# Patient Record
Sex: Female | Born: 1945
Health system: Southern US, Community
[De-identification: ages and names within clinical notes are randomized; demographics above are authoritative.]

## PROBLEM LIST (undated history)

## (undated) DIAGNOSIS — I341 Nonrheumatic mitral (valve) prolapse: Secondary | ICD-10-CM

## (undated) DIAGNOSIS — K529 Noninfective gastroenteritis and colitis, unspecified: Secondary | ICD-10-CM

## (undated) DIAGNOSIS — I8390 Asymptomatic varicose veins of unspecified lower extremity: Secondary | ICD-10-CM

## (undated) DIAGNOSIS — L900 Lichen sclerosus et atrophicus: Secondary | ICD-10-CM

## (undated) DIAGNOSIS — R29898 Other symptoms and signs involving the musculoskeletal system: Secondary | ICD-10-CM

## (undated) HISTORY — PX: TONSILLECTOMY: SUR1361

## (undated) HISTORY — DX: Noninfective gastroenteritis and colitis, unspecified: K52.9

## (undated) HISTORY — PX: TUBAL LIGATION: SHX77

## (undated) HISTORY — DX: Other symptoms and signs involving the musculoskeletal system: R29.898

## (undated) HISTORY — PX: RHINOPLASTY: SUR1284

## (undated) HISTORY — PX: OTHER SURGICAL HISTORY: SHX169

## (undated) HISTORY — DX: Asymptomatic varicose veins of unspecified lower extremity: I83.90

## (undated) HISTORY — DX: Lichen sclerosus et atrophicus: L90.0

## (undated) HISTORY — DX: Nonrheumatic mitral (valve) prolapse: I34.1

---

## 1996-06-11 ENCOUNTER — Encounter: Payer: Self-pay | Admitting: Gastroenterology

## 1996-06-25 ENCOUNTER — Encounter: Payer: Self-pay | Admitting: Gastroenterology

## 1999-10-21 ENCOUNTER — Other Ambulatory Visit: Admission: RE | Admit: 1999-10-21 | Discharge: 1999-10-21 | Payer: Self-pay | Admitting: Obstetrics and Gynecology

## 2000-11-17 ENCOUNTER — Other Ambulatory Visit: Admission: RE | Admit: 2000-11-17 | Discharge: 2000-11-17 | Payer: Self-pay | Admitting: Obstetrics and Gynecology

## 2001-06-28 ENCOUNTER — Encounter: Payer: Self-pay | Admitting: Otolaryngology

## 2001-06-28 ENCOUNTER — Encounter: Admission: RE | Admit: 2001-06-28 | Discharge: 2001-06-28 | Payer: Self-pay | Admitting: Otolaryngology

## 2001-11-27 ENCOUNTER — Other Ambulatory Visit: Admission: RE | Admit: 2001-11-27 | Discharge: 2001-11-27 | Payer: Self-pay | Admitting: Obstetrics and Gynecology

## 2002-05-02 ENCOUNTER — Other Ambulatory Visit: Admission: RE | Admit: 2002-05-02 | Discharge: 2002-05-02 | Payer: Self-pay | Admitting: Obstetrics and Gynecology

## 2002-12-19 ENCOUNTER — Other Ambulatory Visit: Admission: RE | Admit: 2002-12-19 | Discharge: 2002-12-19 | Payer: Self-pay | Admitting: Obstetrics and Gynecology

## 2003-12-23 ENCOUNTER — Other Ambulatory Visit: Admission: RE | Admit: 2003-12-23 | Discharge: 2003-12-23 | Payer: Self-pay | Admitting: Obstetrics and Gynecology

## 2004-12-24 ENCOUNTER — Ambulatory Visit: Payer: Self-pay | Admitting: Gastroenterology

## 2004-12-30 ENCOUNTER — Other Ambulatory Visit: Admission: RE | Admit: 2004-12-30 | Discharge: 2004-12-30 | Payer: Self-pay | Admitting: Obstetrics and Gynecology

## 2004-12-31 ENCOUNTER — Ambulatory Visit: Payer: Self-pay | Admitting: Gastroenterology

## 2005-05-23 ENCOUNTER — Ambulatory Visit: Payer: Self-pay | Admitting: Internal Medicine

## 2005-09-13 ENCOUNTER — Emergency Department (HOSPITAL_COMMUNITY): Admission: EM | Admit: 2005-09-13 | Discharge: 2005-09-13 | Payer: Self-pay | Admitting: Emergency Medicine

## 2005-09-13 IMAGING — CR DG CHEST 1V PORT
1 series · 1 of 1 positions shown · non-contrast
Comparison: No prior studies.

CLINICAL DATA: Mid chest pressure and pain.  Shortness of breath. 
PORTABLE CHEST ? 1 VIEW ? [DATE]:

[view not recorded]
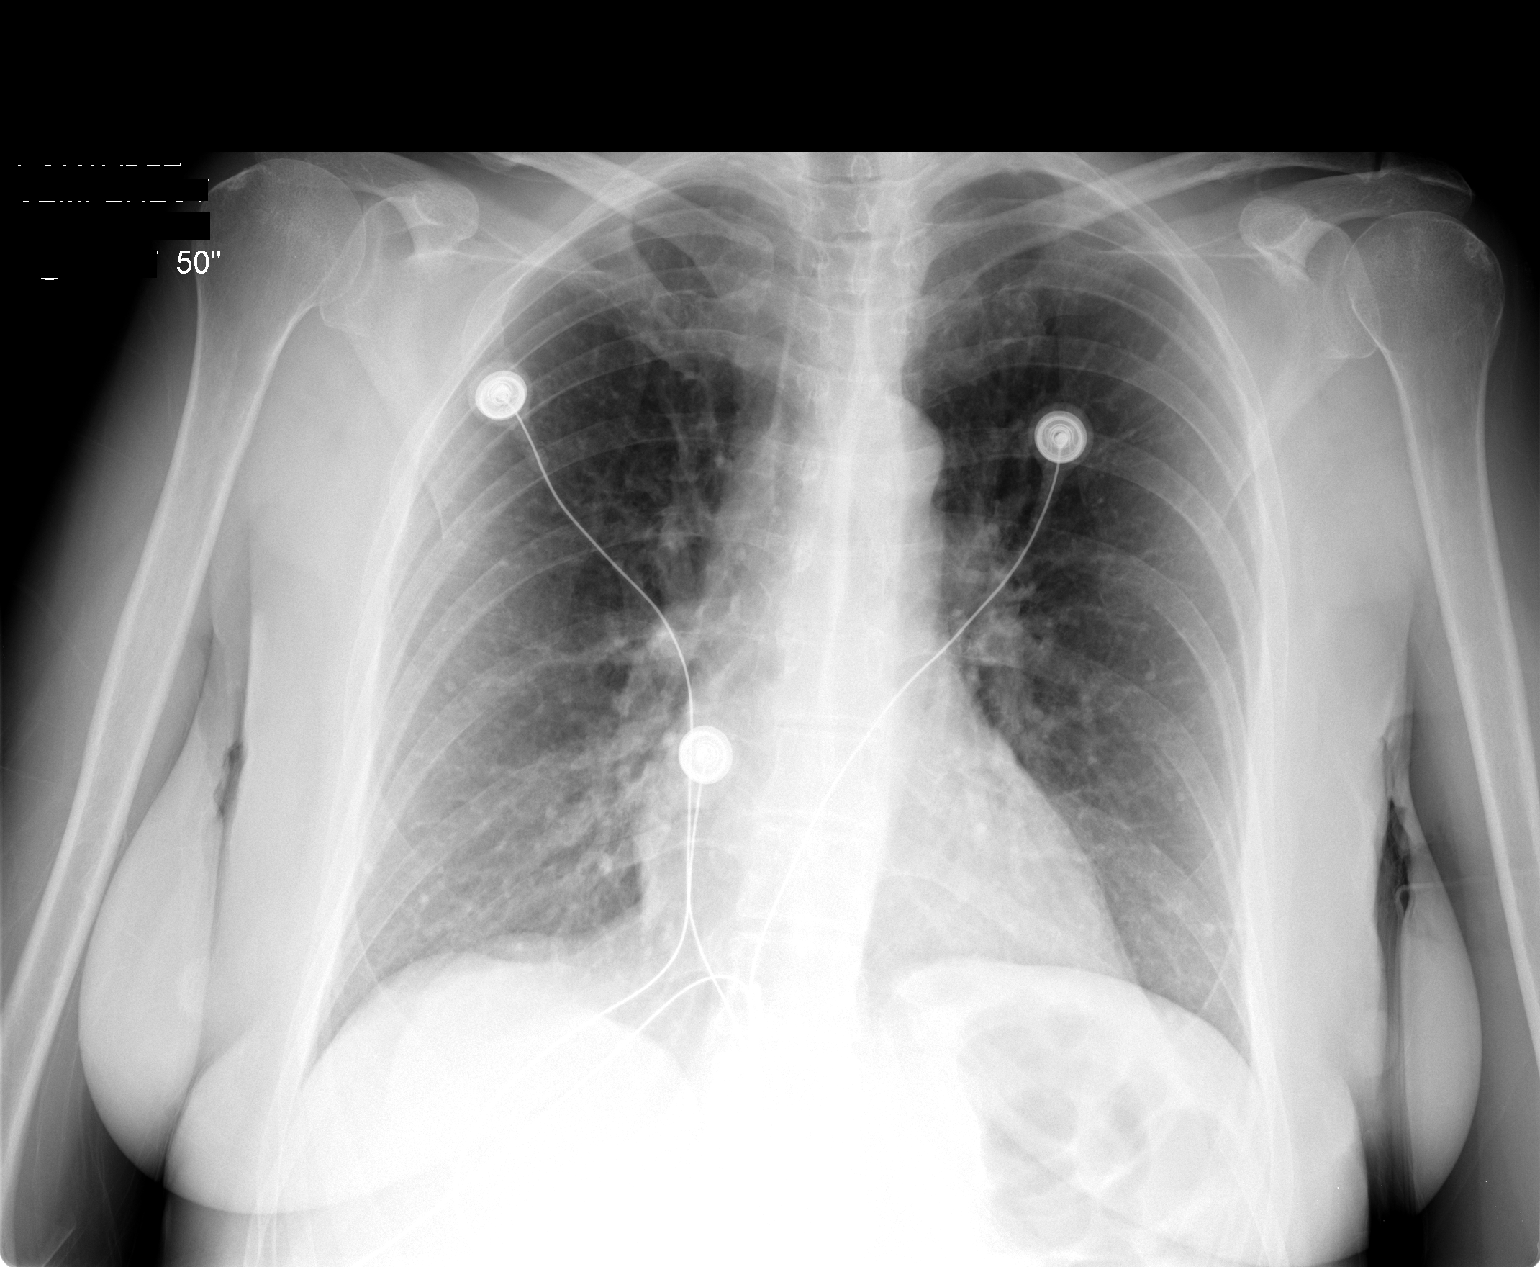

[1 of 1 positions shown; findings below may reference images not displayed]

FINDINGS: There are some scattered faint rounded micro-nodules in the lungs.  These are well-defined, and I suspect they could be calcified, as can be encountered in the setting of old granulomatous disease such as histoplasmosis, old varicella pneumonia, or alveolar microlithiasis.  Metastatic thyroid or osteosarcoma lesions are less likely cause of this type of appearance.  However, chest CT may be warranted for further characterization.  
The heart and mediastinum appear unremarkable.  No definite left heart enlargement to suggest mitral stenosis as a cause for potential pulmonary calcifications.
IMPRESSION: Scattered small micro-nodules.  These may well be calcified with differential diagnostic considerations as noted above.  nsider high resolution chest CT for further characterization.

## 2006-05-19 ENCOUNTER — Ambulatory Visit: Payer: Self-pay | Admitting: Internal Medicine

## 2006-05-29 ENCOUNTER — Ambulatory Visit: Payer: Self-pay | Admitting: Internal Medicine

## 2006-08-09 ENCOUNTER — Ambulatory Visit: Payer: Self-pay | Admitting: Internal Medicine

## 2006-08-09 DIAGNOSIS — Z9089 Acquired absence of other organs: Secondary | ICD-10-CM

## 2006-08-09 DIAGNOSIS — M810 Age-related osteoporosis without current pathological fracture: Secondary | ICD-10-CM | POA: Insufficient documentation

## 2007-02-05 ENCOUNTER — Telehealth (INDEPENDENT_AMBULATORY_CARE_PROVIDER_SITE_OTHER): Payer: Self-pay | Admitting: *Deleted

## 2007-04-03 ENCOUNTER — Ambulatory Visit: Payer: Self-pay | Admitting: Vascular Surgery

## 2007-04-03 ENCOUNTER — Emergency Department (HOSPITAL_COMMUNITY): Admission: EM | Admit: 2007-04-03 | Discharge: 2007-04-03 | Payer: Self-pay | Admitting: Emergency Medicine

## 2007-04-03 IMAGING — CR DG FOOT COMPLETE 3+V*R*
3 series · 3 of 3 positions shown · non-contrast
Comparison: none

CLINICAL DATA: 61-year-old female, right foot pain, swelling and redness.
 RIGHT FOOT ? 3 VIEW:

[t foot ap right]
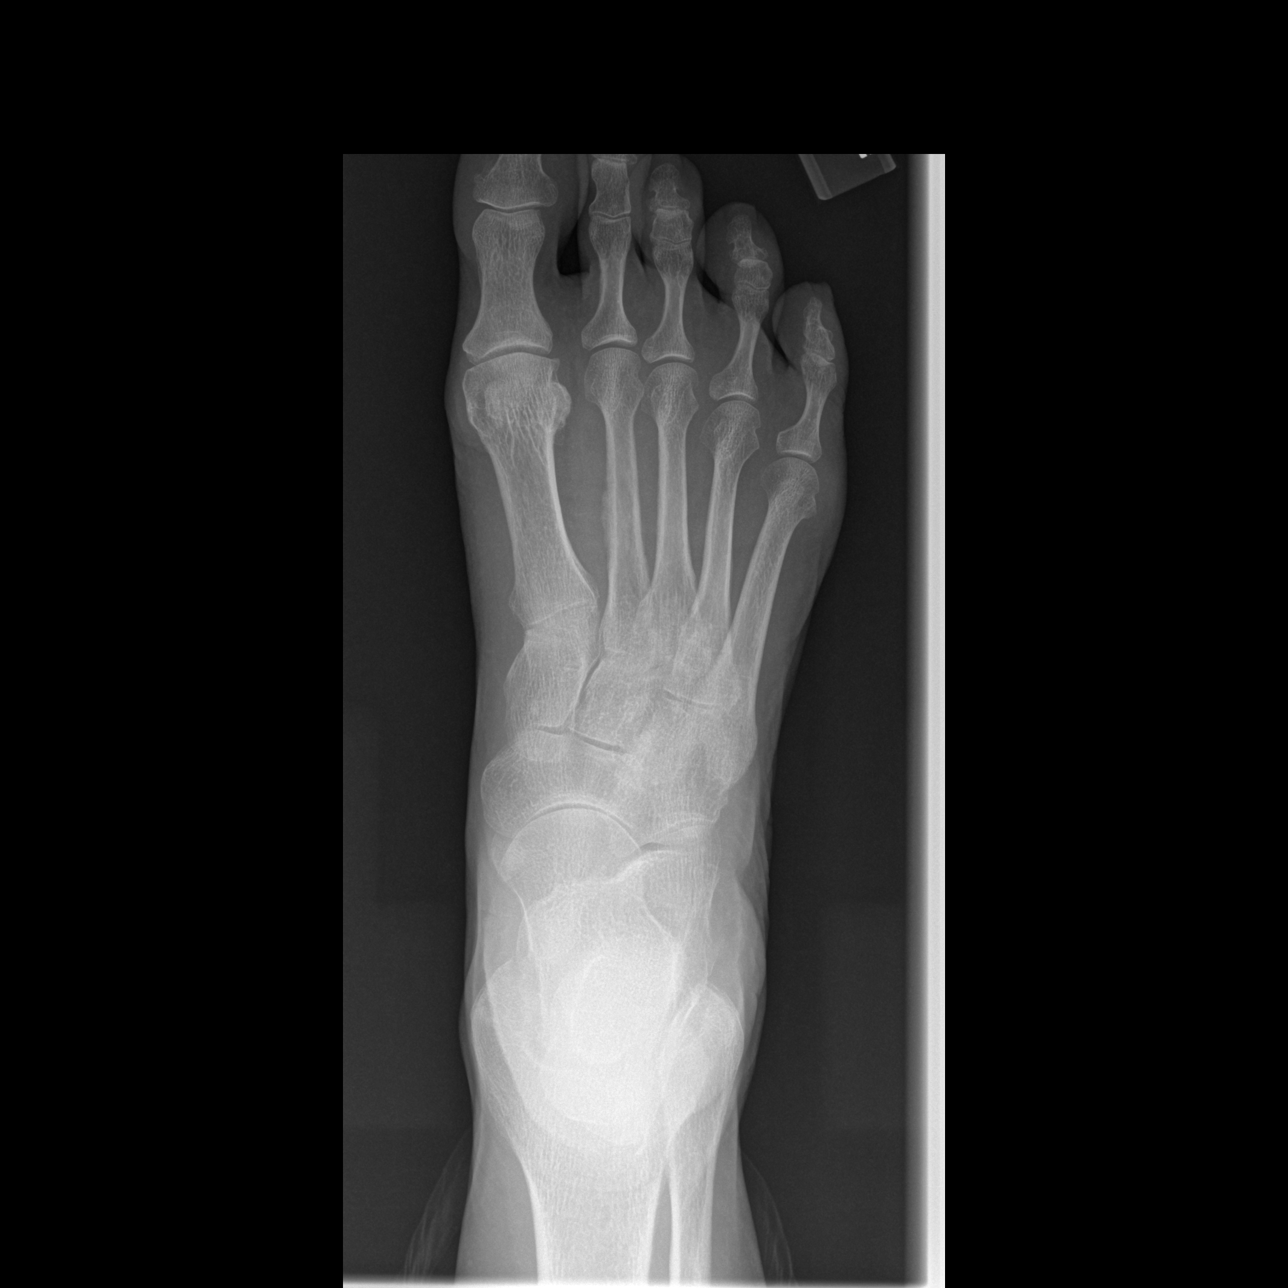

[t foot oblique right]
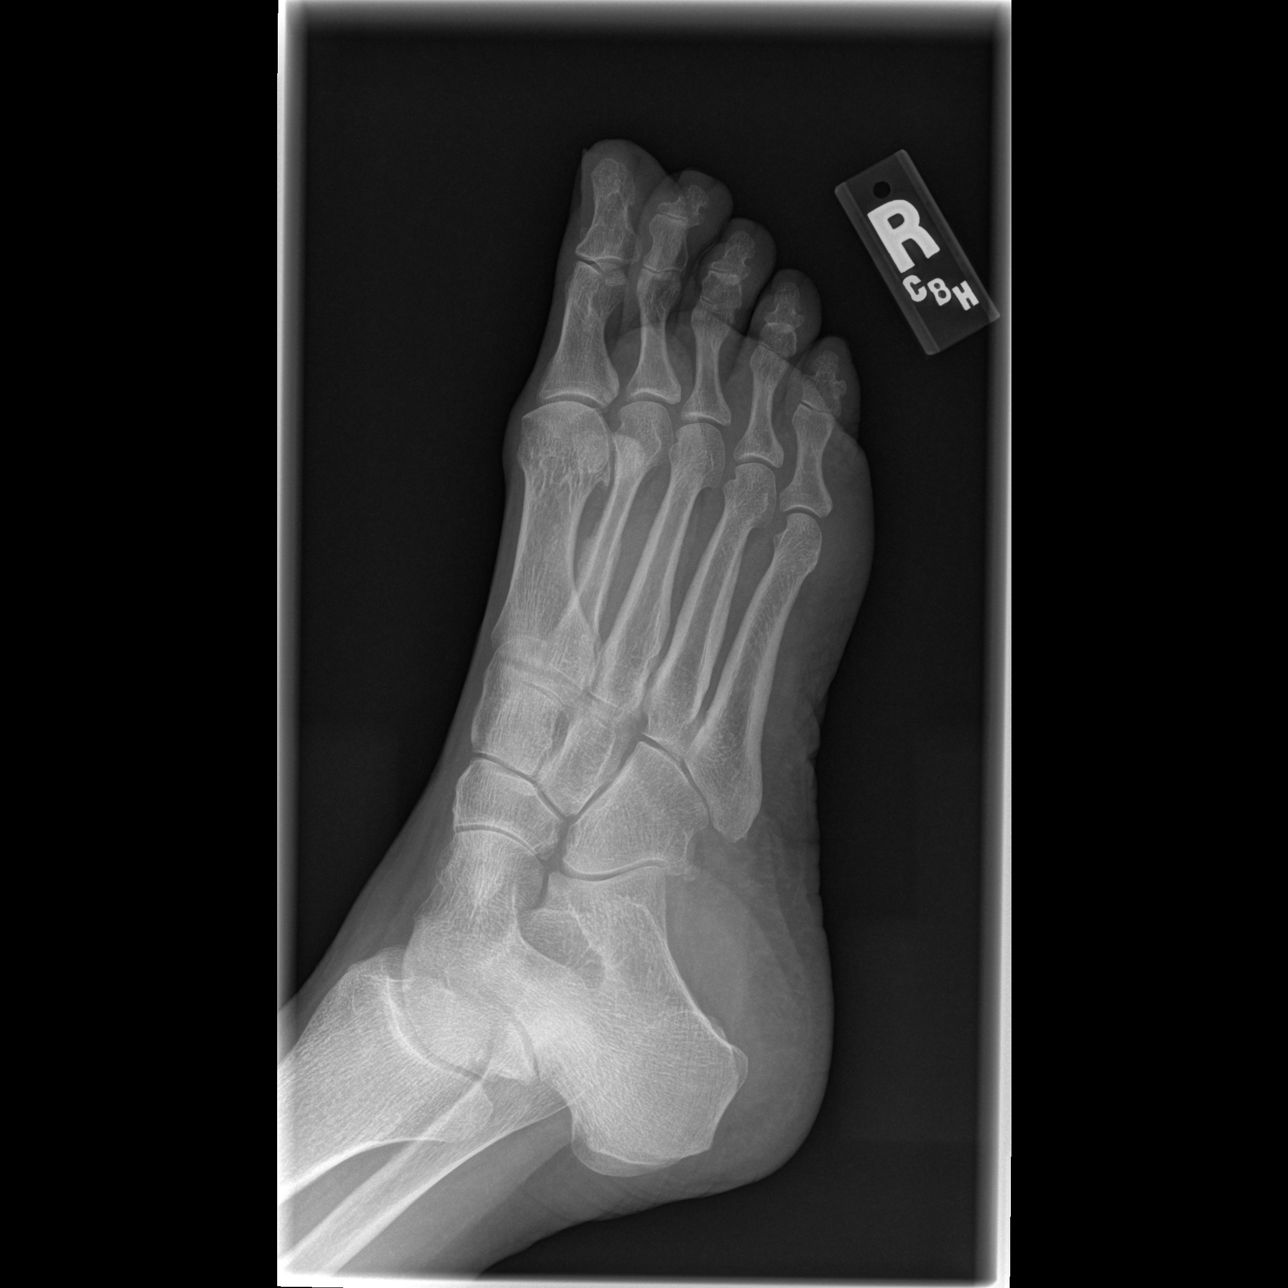

[t foot lat right]
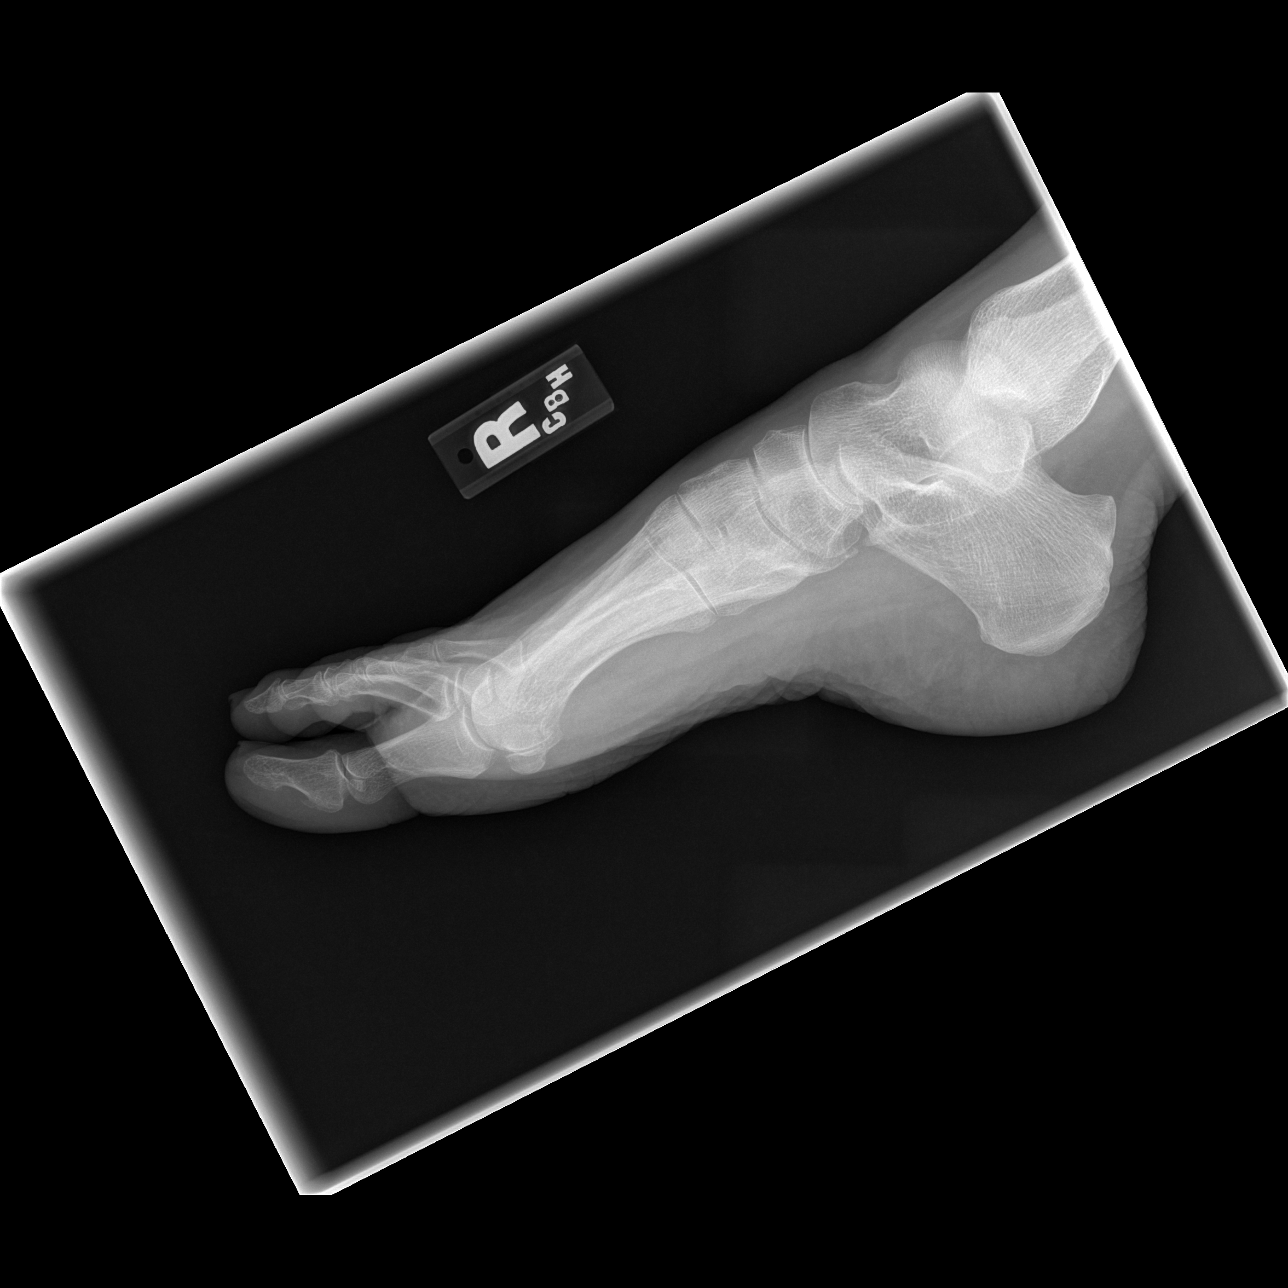

[3 of 3 positions shown; findings below may reference images not displayed]

FINDINGS: There is periostitis involving the midshaft of the second metatarsal bone.  No discrete fracture is seen.  This most likely represents a healing stress fracture.  Minimal soft tissue swelling is noted over the dorsum of the foot.
IMPRESSION: Periostitis of the mid shaft of the second metatarsal bone may represent a healing stress fracture.  Clinical correlation recommended.
 Minimal swelling over the dorsum of the foot.

## 2008-11-25 ENCOUNTER — Encounter: Admission: RE | Admit: 2008-11-25 | Discharge: 2008-11-25 | Payer: Self-pay | Admitting: Obstetrics and Gynecology

## 2008-11-25 IMAGING — MG MM DIAGNOSTIC BILATERAL
4 series · 4 of 4 positions shown · non-contrast
Comparison: [DATE]

[DATE] –DUPLICATE COPY for exam association in RIS. No change from original report.
CLINICAL DATA: Tenderness and thickening at the [DATE] position of
 the left breast

 DIGITAL DIAGNOSTIC BILATERAL MAMMOGRAM AND LEFT BREAST
 ULTRASOUND:

[R CC]
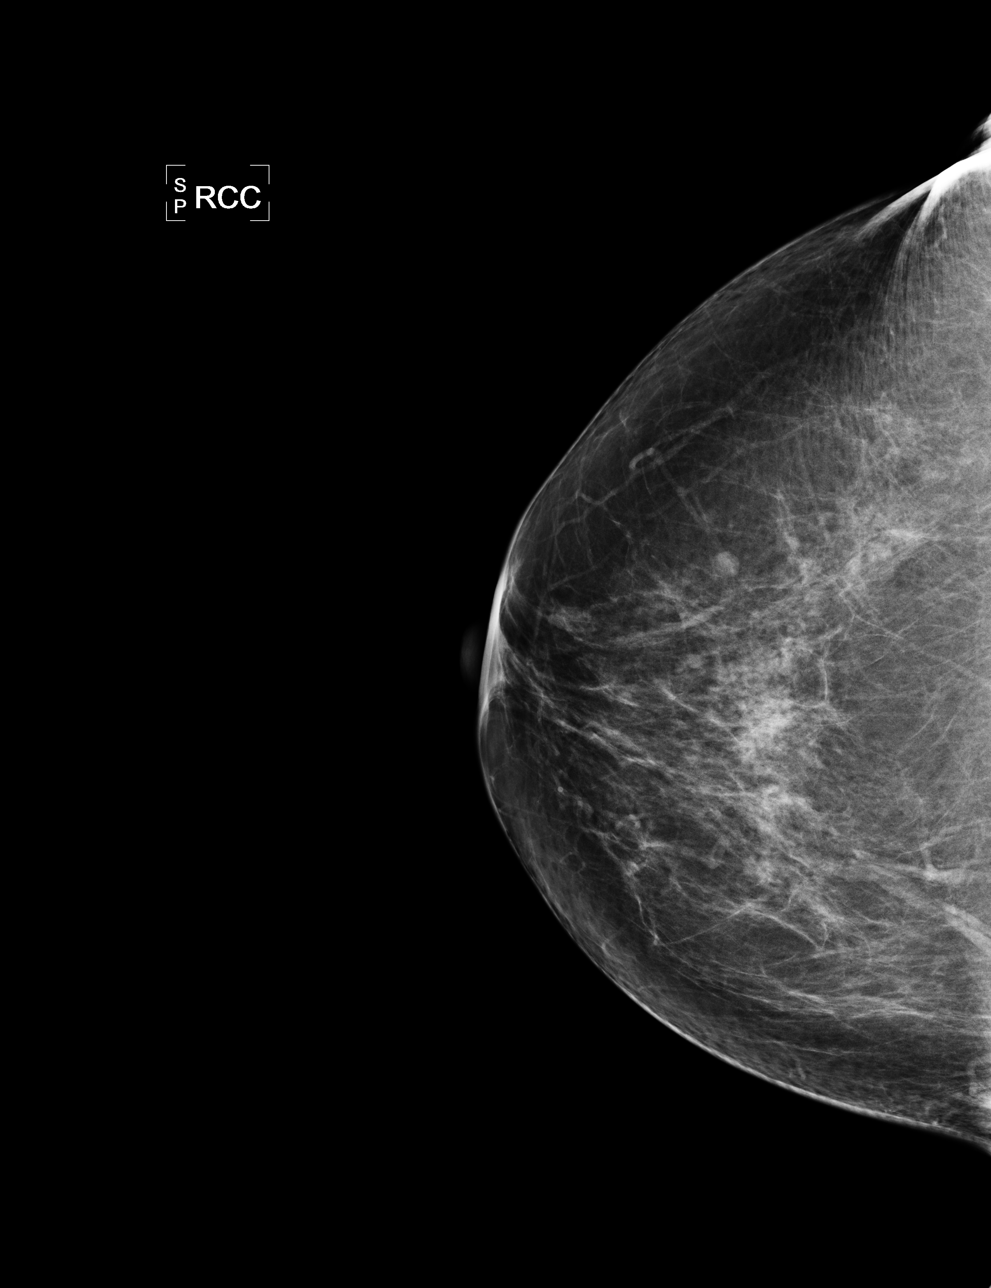

[L CC]
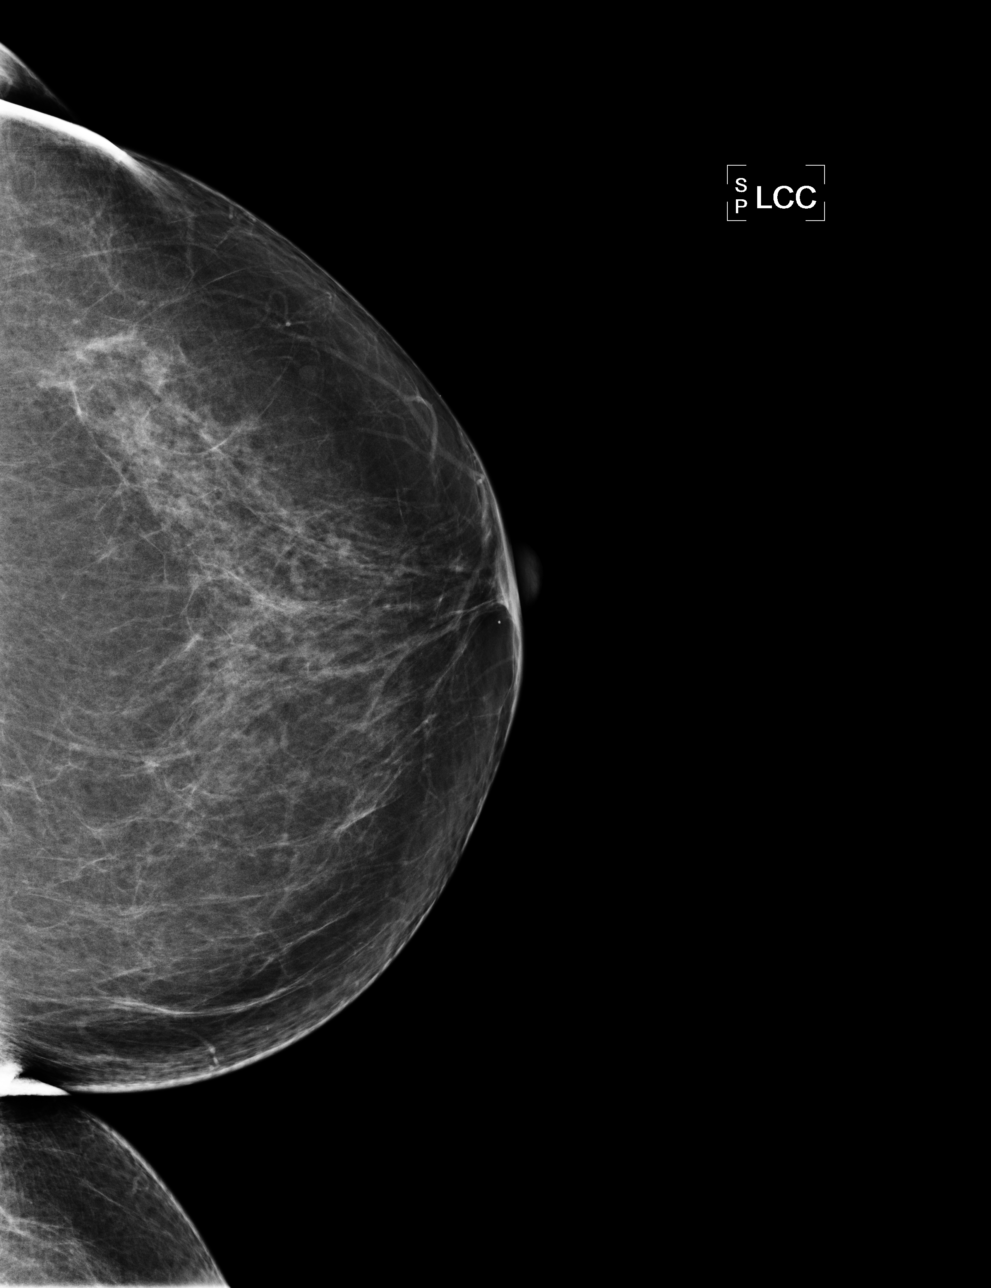

[L MLO]
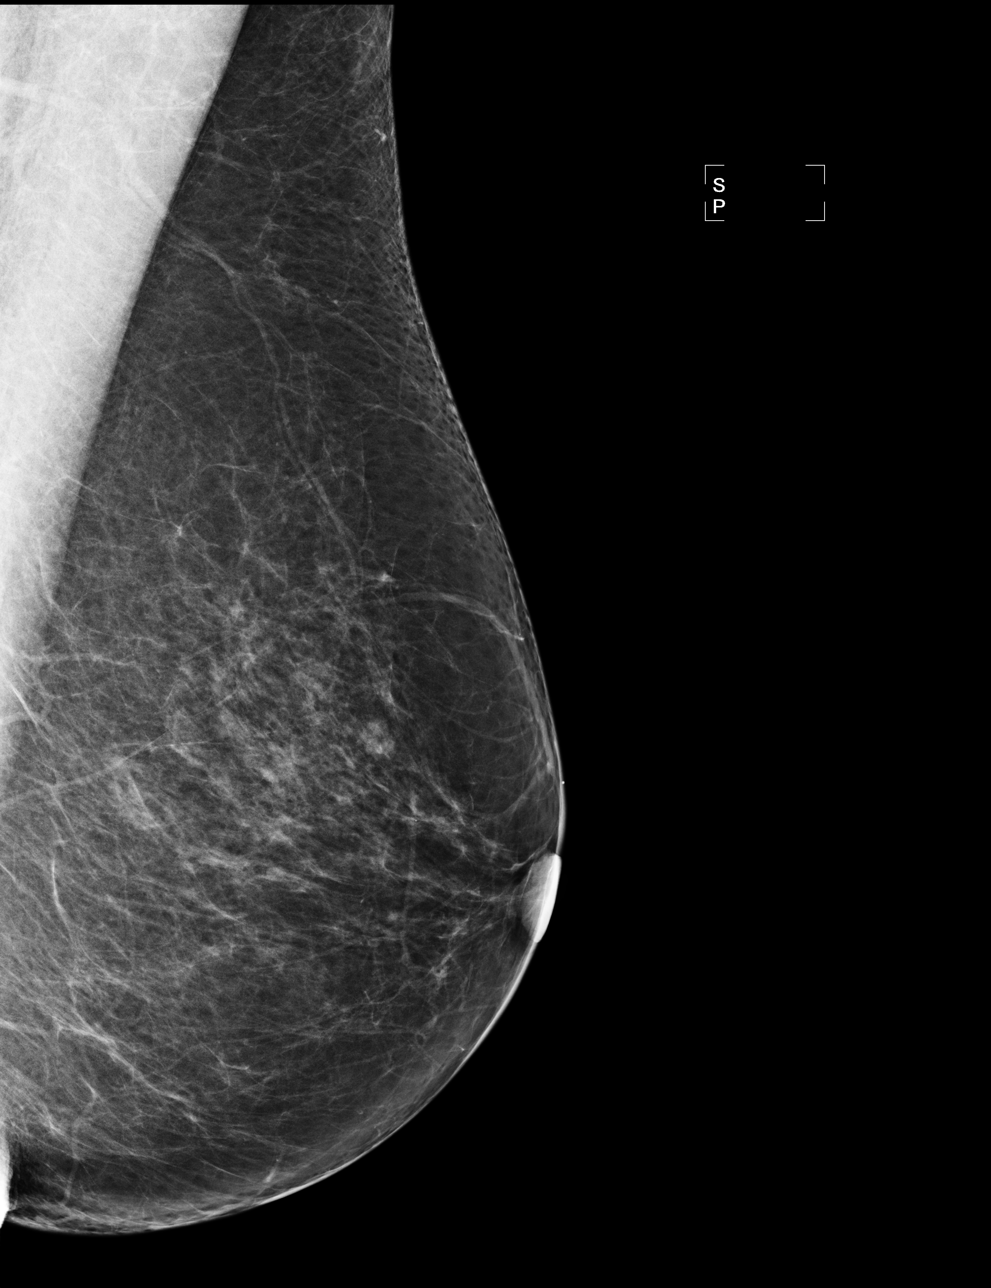

[R MLO]
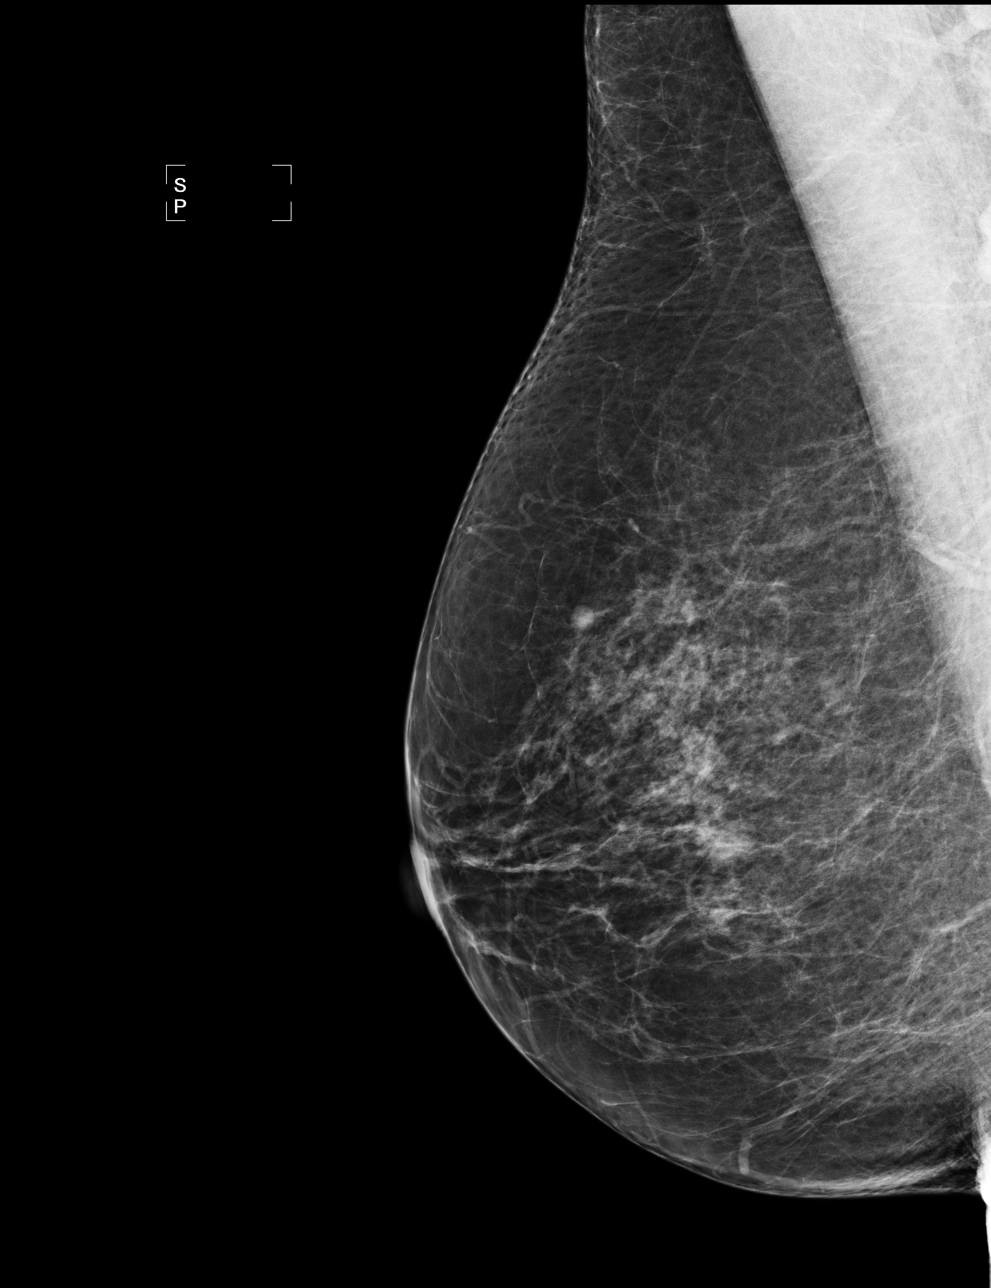

[4 of 4 positions shown; findings below may reference images not displayed]

FINDINGS: The scattered fibroglandular densities are symmetric and
 stable. No dominant masses, suspicious calcifications, or areas of
 architectural distortion are present bilaterally.

 On physical exam, I cannot palpate discrete lump within the left
 upper outer quadrant today.

 Ultrasound is performed, showing dense, normal-appearing
 fibroglandular tissue only in the left upper outer quadrant.
IMPRESSION: There is no radiographic evidence of malignancy bilaterally.
 Screening mammogram in 1 year recommended, unless otherwise
 clinically indicated.

 BI-RADS CATEGORY 2: Benign finding(s).

## 2008-11-25 IMAGING — US US BREAST L
1 series · 2 of 2 positions shown · non-contrast
Comparison: [DATE]

[DATE] –DUPLICATE COPY for exam association in RIS. No change from original report.
CLINICAL DATA: Tenderness and thickening at the [DATE] position of
 the left breast

 DIGITAL DIAGNOSTIC BILATERAL MAMMOGRAM AND LEFT BREAST
 ULTRASOUND:

[Series 1: us breast left · 2 of 2 slices shown]
[im 1/2]
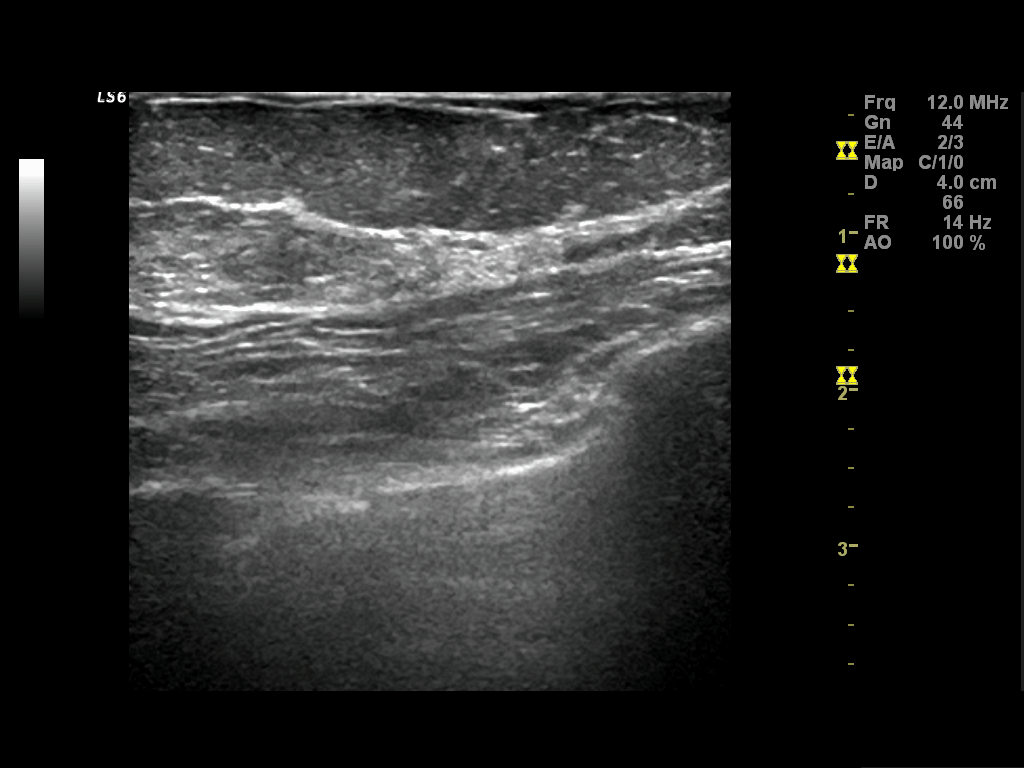
[im 2/2]
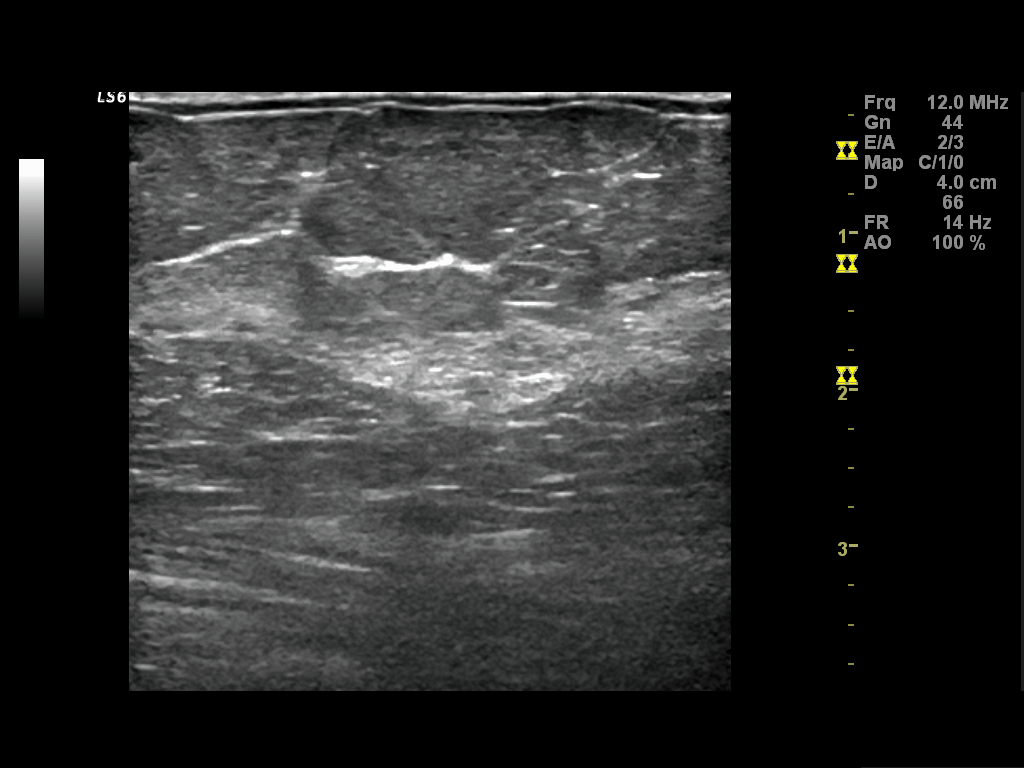

[2 of 2 positions shown; findings below may reference images not displayed]

FINDINGS: The scattered fibroglandular densities are symmetric and
 stable. No dominant masses, suspicious calcifications, or areas of
 architectural distortion are present bilaterally.

 On physical exam, I cannot palpate discrete lump within the left
 upper outer quadrant today.

 Ultrasound is performed, showing dense, normal-appearing
 fibroglandular tissue only in the left upper outer quadrant.
IMPRESSION: There is no radiographic evidence of malignancy bilaterally.
 Screening mammogram in 1 year recommended, unless otherwise
 clinically indicated.

 BI-RADS CATEGORY 2: Benign finding(s).

## 2010-04-20 ENCOUNTER — Inpatient Hospital Stay (HOSPITAL_COMMUNITY)
Admission: EM | Admit: 2010-04-20 | Discharge: 2010-04-22 | Payer: Self-pay | Source: Home / Self Care | Attending: Internal Medicine | Admitting: Internal Medicine

## 2010-04-21 ENCOUNTER — Encounter (INDEPENDENT_AMBULATORY_CARE_PROVIDER_SITE_OTHER): Payer: Self-pay | Admitting: *Deleted

## 2010-04-21 IMAGING — CT CT ABD-PELV W/O CM
2 of 4 series · 17 of 46 positions shown, 19 images · non-contrast
Comparison: None.

CLINICAL DATA: Low back and abdominal pain with nausea.  History of
tubal ligation.

CT ABDOMEN AND PELVIS WITHOUT CONTRAST
TECHNIQUE: Multidetector CT imaging of the abdomen and pelvis was
performed following the standard protocol without intravenous
contrast.

[Series 2: rtn ap without · axial · non-contrast · 0.57mm/px · z∈[-420,-64]mm · 14 of 79 slices shown, 16 images]
[im 4/79  soft-tissue]
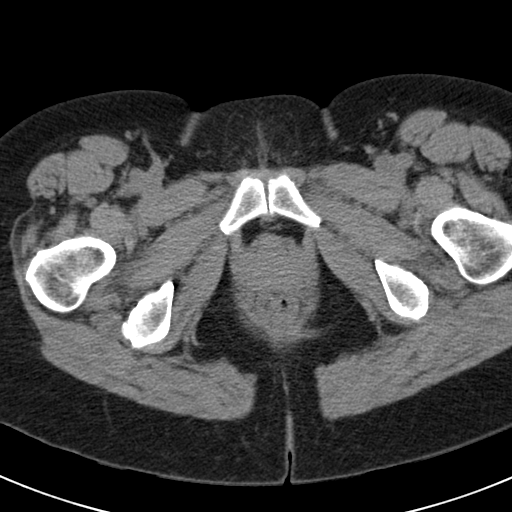
[im 4/79  bone]
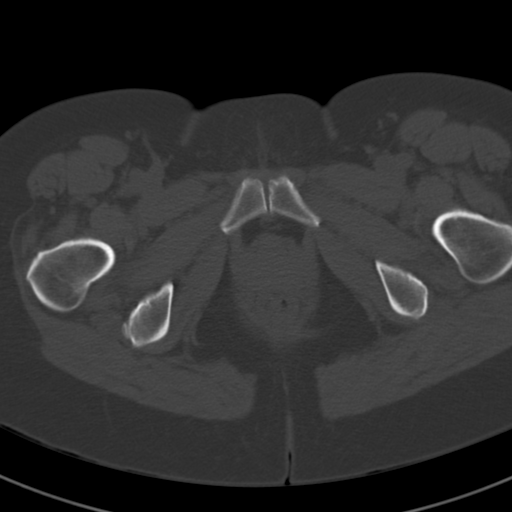
[im 10/79  soft-tissue]
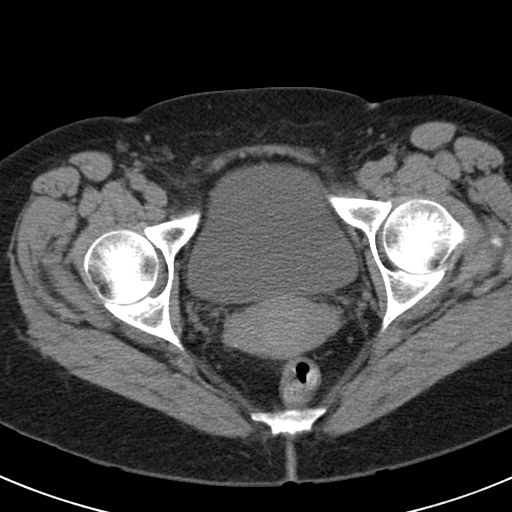
[im 16/79  soft-tissue]
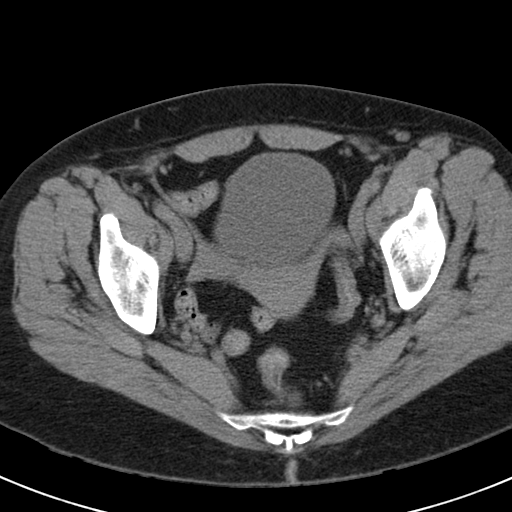
[im 22/79  soft-tissue]
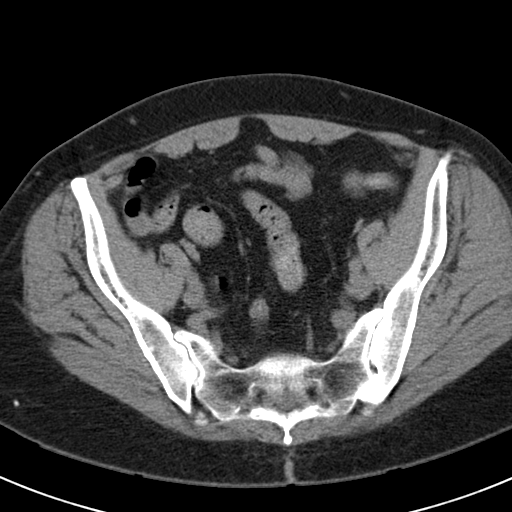
[im 25/79  soft-tissue]
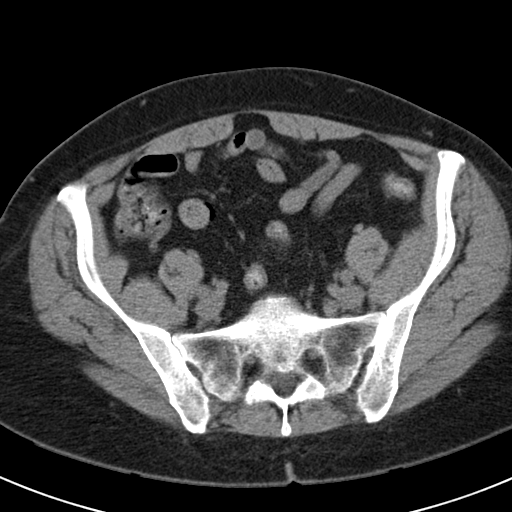
[im 32/79  soft-tissue]
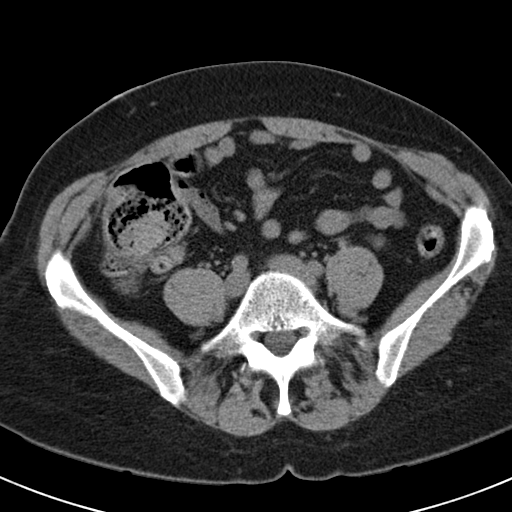
[im 38/79  soft-tissue]
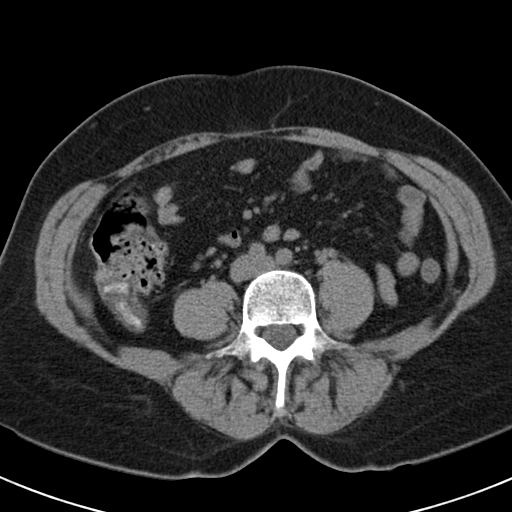
[im 41/79  soft-tissue]
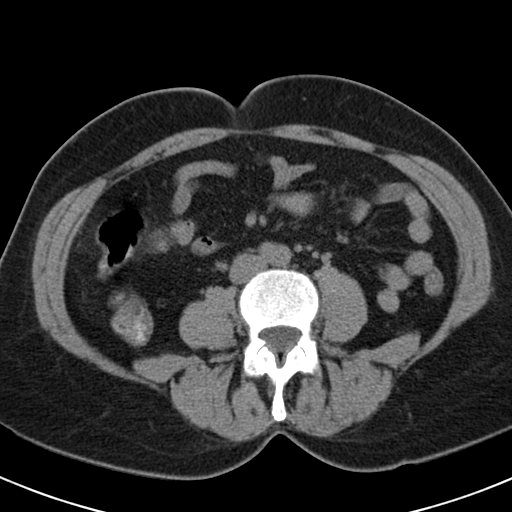
[im 47/79  soft-tissue]
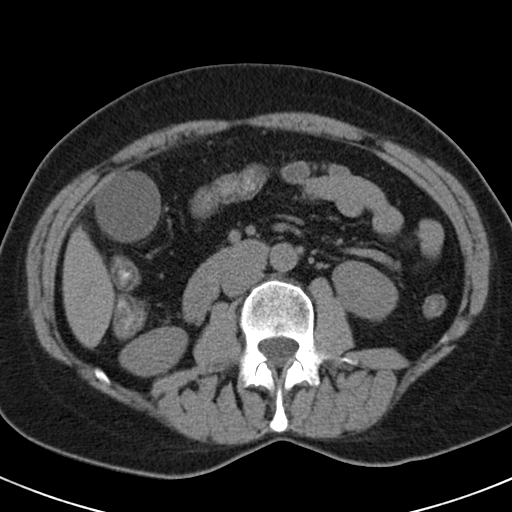
[im 47/79  bone]
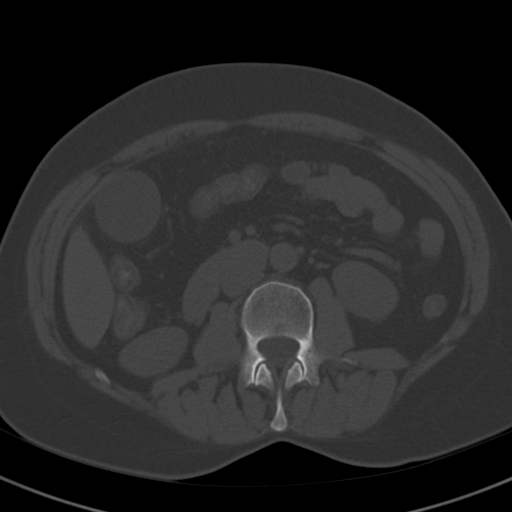
[im 54/79  soft-tissue]
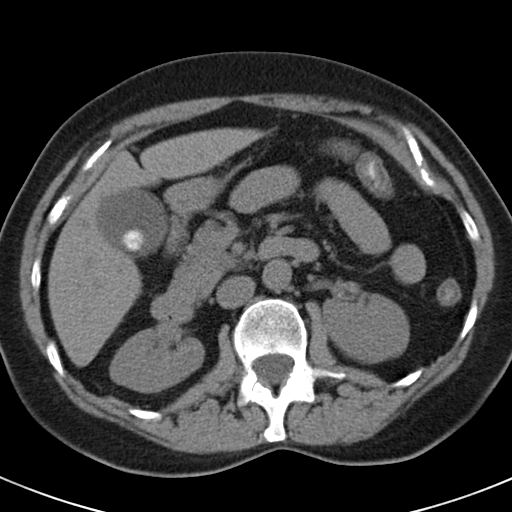
[im 60/79  soft-tissue]
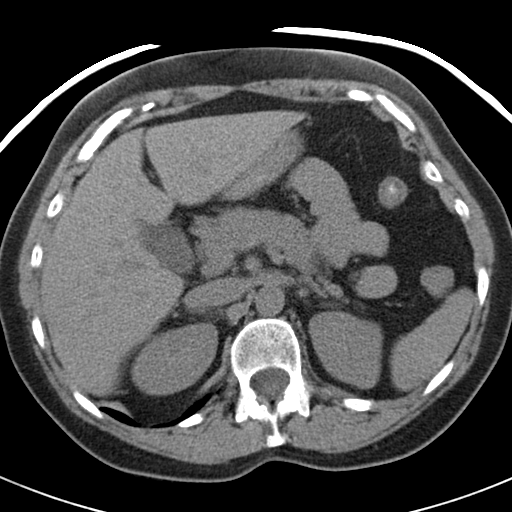
[im 63/79  soft-tissue]
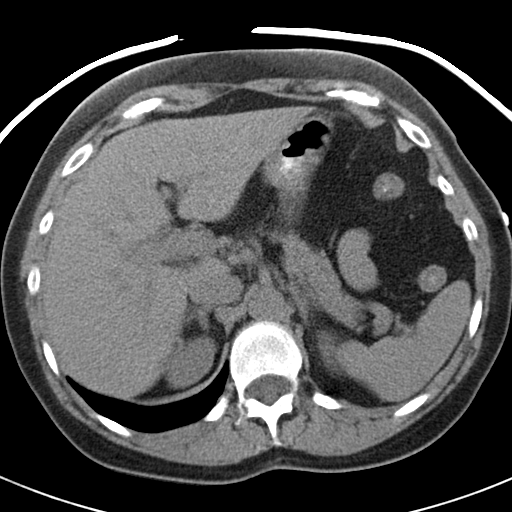
[im 69/79  soft-tissue]
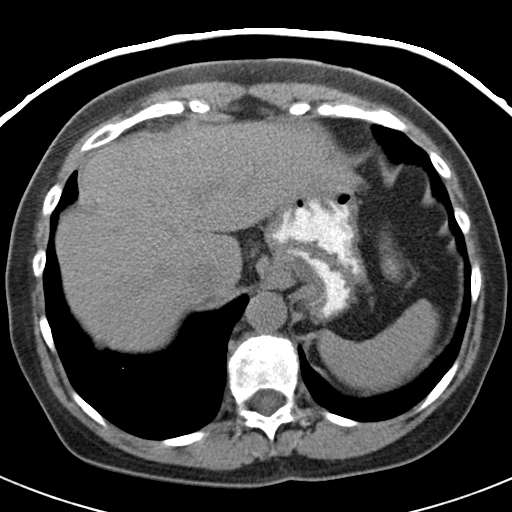
[im 75/79  soft-tissue]
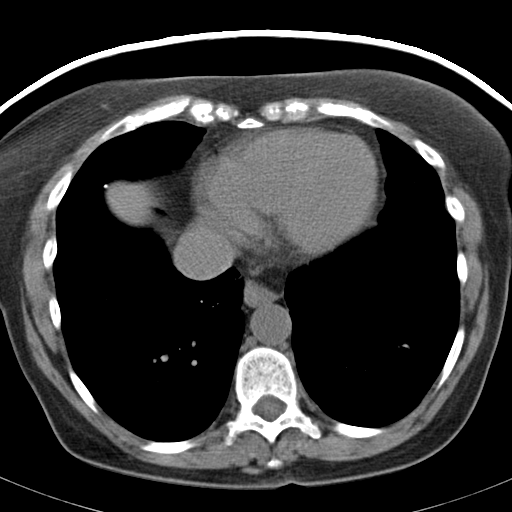

[Series 602: <mpr thick range> · coronal · 0.81mm/px · 3 of 71 slices shown]
[im 24/71  soft-tissue]
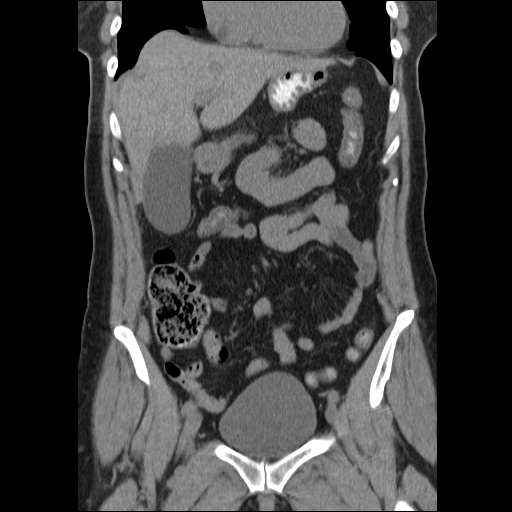
[im 32/71  soft-tissue]
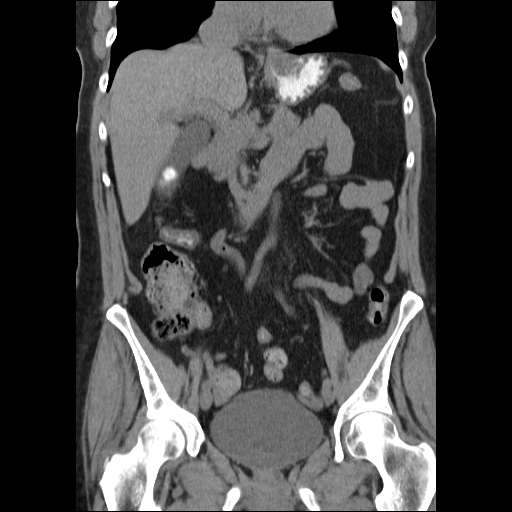
[im 39/71  soft-tissue]
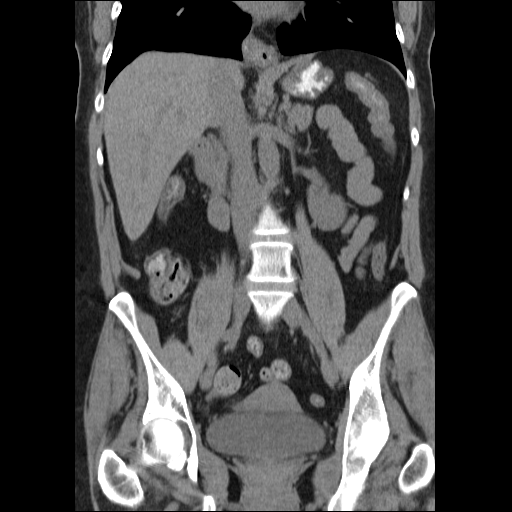

[17 of 46 positions shown; findings below may reference images not displayed]

FINDINGS: There are calcified granulomas in both lung bases.  Lung
bases are otherwise clear.  There is a small hiatal hernia.

1.4 cm calcified gallstone is noted.  There is no gallbladder wall
thickening or biliary dilatation.  The liver, spleen, pancreas,
adrenal glands and kidneys appear unremarkable.

The patient received limited enteric contrast.  The stomach is
decompressed.  No small bowel abnormalities are identified.  There
is probable diffuse colonic wall thickening extending from the
ascending colon into the descending colon.  No significant
surrounding inflammation is identified.  There is no significant
diverticular disease or evidence of extraluminal fluid collection.
There is no evidence of bowel obstruction or lymphadenopathy.

The osseous structures appear unremarkable for age.  The uterus,
ovaries and bladder appear unremarkable.
IMPRESSION: 1.  Diffuse colonic wall thickening suspicious for colitis,
suboptimally evaluated due to incomplete distension and lack of
intravenous contrast.  This distribution can be seen with
pseudomembranous colitis, other infections and Crohn's disease.
Distal sparing is atypical for ulcerative colitis.  Correlate
clinically.
2.  No evidence of perforation, abscess or obstruction.
3.  Cholelithiasis.
4.  No significant solid parenchymal organ findings.
5.  Calcified granulomas at both lung bases.

## 2010-04-22 ENCOUNTER — Encounter (INDEPENDENT_AMBULATORY_CARE_PROVIDER_SITE_OTHER): Payer: Self-pay | Admitting: *Deleted

## 2010-04-26 LAB — URINE MICROSCOPIC-ADD ON

## 2010-04-26 LAB — COMPREHENSIVE METABOLIC PANEL
ALT: 18 U/L (ref 0–35)
AST: 26 U/L (ref 0–37)
Albumin: 4.2 g/dL (ref 3.5–5.2)
CO2: 24 mEq/L (ref 19–32)
Calcium: 9.3 mg/dL (ref 8.4–10.5)
GFR calc Af Amer: >60 mL/min (ref >60)
Sodium: 138 mEq/L (ref 135–145)
Total Protein: 7.4 g/dL (ref 6.0–8.3)

## 2010-04-26 LAB — URINALYSIS, ROUTINE W REFLEX MICROSCOPIC
Bilirubin Urine: NEGATIVE
Hgb urine dipstick: NEGATIVE
Specific Gravity, Urine: 1.028 (ref 1.005–1.030)
Urine Glucose, Fasting: NEGATIVE mg/dL
pH: 6 (ref 5.0–8.0)

## 2010-04-26 LAB — BASIC METABOLIC PANEL
BUN: 7 mg/dL (ref 6–23)
Calcium: 8.2 mg/dL — ABNORMAL LOW (ref 8.4–10.5)
Creatinine, Ser: 0.82 mg/dL (ref 0.4–1.2)
GFR calc Af Amer: >60 mL/min (ref >60)

## 2010-04-26 LAB — CBC
Hemoglobin: 14.7 g/dL (ref 12.0–15.0)
MCH: 31 pg (ref 26.0–34.0)
MCHC: 33.6 g/dL (ref 30.0–36.0)
Platelets: 174 10*3/uL (ref 150–400)
RBC: 4.13 MIL/uL (ref 3.87–5.11)
RDW: 13 % (ref 11.5–15.5)
RDW: 13.5 % (ref 11.5–15.5)
WBC: 7.5 10*3/uL (ref 4.0–10.5)

## 2010-04-26 LAB — DIFFERENTIAL
Basophils Absolute: 0 10*3/uL (ref 0.0–0.1)
Basophils Relative: 0 % (ref 0–1)
Monocytes Absolute: 0.8 10*3/uL (ref 0.1–1.0)
Neutro Abs: 8.3 10*3/uL — ABNORMAL HIGH (ref 1.7–7.7)

## 2010-05-04 ENCOUNTER — Telehealth: Payer: Self-pay | Admitting: Gastroenterology

## 2010-05-04 ENCOUNTER — Ambulatory Visit
Admission: RE | Admit: 2010-05-04 | Discharge: 2010-05-04 | Payer: Self-pay | Source: Home / Self Care | Attending: Gastroenterology | Admitting: Gastroenterology

## 2010-05-04 ENCOUNTER — Encounter: Payer: Self-pay | Admitting: Gastroenterology

## 2010-05-04 DIAGNOSIS — R933 Abnormal findings on diagnostic imaging of other parts of digestive tract: Secondary | ICD-10-CM | POA: Insufficient documentation

## 2010-05-06 NOTE — Procedures (Signed)
Summary: Colonoscopy   Colonoscopy  Procedure date:  12/31/2004  Findings:      Results: Hemorrhoids.     Location:  [facility].    Procedures Next Due Date:    Colonoscopy: 01/2015  Colonoscopy  Procedure date:  12/31/2004  Findings:      Results: Hemorrhoids.     Location:  [facility].    Procedures Next Due Date:    Colonoscopy: 01/2015 Patient Name: Brandi, Villarreal MRN:  Procedure Procedures: Colonoscopy CPT: 62376.  Personnel: Endoscopist: Venita Lick. Russella Dar, MD, [facility] Graham.  Exam Location: Exam performed in Outpatient Clinic. Outpatient  Patient Consent: Procedure, Alternatives, Risks and Benefits discussed, consent obtained, from patient. Consent was obtained by the RN.  Indications  Average Risk Screening Routine.  History  Current Medications: Patient is not currently taking Coumadin.  Pre-Exam Physical: Performed Dec 31, 2004. Cardio-pulmonary exam, Rectal exam, HEENT exam , Abdominal exam, Mental status exam WNL.  Exam Exam: Extent of exam reached: Cecum, extent intended: Cecum.  The cecum was identified by appendiceal orifice and IC valve. Colon retroflexion performed. ASA Classification: II. Tolerance: excellent.  Monitoring: Pulse and BP monitoring, Oximetry used. Supplemental O2 given.  Colon Prep Used Glycolax for colon prep. Prep results: excellent.  Sedation Meds: Patient assessed and found to be appropriate for moderate (conscious) sedation. Fentanyl 125 mcg. given IV. Versed 12 given IV.  Findings NORMAL EXAM: Cecum to Sigmoid Colon.  HEMORRHOIDS: Internal. Size: Small. Not bleeding. Not thrombosed. ICD9: Hemorrhoids, Internal: 455.0.   Assessment  Diagnoses: 455.0: Hemorrhoids, Internal.   Events  Unplanned Interventions: No intervention was required.  Unplanned Events: There were no complications. Plans  Post Exam Instructions: Post sedation instructions given.  Medication Plan: PPI: QAM,    Patient Education: Patient given standard instructions for: Hemorrhoids. Reflux.  Disposition: After procedure patient sent to recovery. After recovery patient sent home.  Scheduling/Referral: Colonoscopy, to [facility] T. Russella Dar, MD, [facility], around Jan 01, 2015.  Home stool hemocults, to [facility] Dubin. Alwyn Ren, MD, resume annual testing, around Dec 31, 2009.    This report was created from the original endoscopy report, which was reviewed and signed by the above listed endoscopist.   cc: Titus Dubin. Alwyn Ren, MD

## 2010-05-07 ENCOUNTER — Other Ambulatory Visit (AMBULATORY_SURGERY_CENTER): Payer: BC Managed Care – PPO | Admitting: Gastroenterology

## 2010-05-07 ENCOUNTER — Encounter: Payer: Self-pay | Admitting: Gastroenterology

## 2010-05-07 DIAGNOSIS — Z1211 Encounter for screening for malignant neoplasm of colon: Secondary | ICD-10-CM

## 2010-05-07 DIAGNOSIS — K648 Other hemorrhoids: Secondary | ICD-10-CM

## 2010-05-07 DIAGNOSIS — Z800 Family history of malignant neoplasm of digestive organs: Secondary | ICD-10-CM

## 2010-05-07 LAB — HM COLONOSCOPY: HM Colonoscopy: NORMAL

## 2010-05-12 NOTE — Letter (Signed)
Summary: [facility] Instructions  [facility]  [address] [place], [place] [address]   Phone: [phone]  Fax: [phone]       Brandi Villarreal    10/07/1945    MRN: [id number]        Procedure Day /Date: Friday February 3rd, 2012     Arrival Time: 9:30am     Procedure Time: 10:30am     Location of Procedure:                    _ x_  [facility] (4th Floor)                        PREPARATION FOR COLONOSCOPY WITH MOVIPREP   Starting 5 days prior to your procedure today do not eat nuts, seeds, popcorn, corn, beans, peas,  salads, or any raw vegetables.  Do not take any fiber supplements (e.g. Metamucil, Citrucel, and Benefiber).  THE DAY BEFORE YOUR PROCEDURE         DATE: 05/06/10  DAY: Thursday  1.  Drink clear liquids the entire day-NO SOLID FOOD  2.  Do not drink anything colored red or purple.  Avoid juices with pulp.  No orange juice.  3.  Drink at least 64 oz. (8 glasses) of fluid/clear liquids during the day to prevent dehydration and help the prep work efficiently.  CLEAR LIQUIDS INCLUDE: Water Jello Ice Popsicles Tea (sugar ok, no milk/cream) Powdered fruit flavored drinks Coffee (sugar ok, no milk/cream) Gatorade Juice: apple, white grape, white cranberry  Lemonade Clear bullion, consomm, broth Carbonated beverages (any kind) Strained chicken noodle soup Hard Candy                             4.  In the morning, mix first dose of MoviPrep solution:    Empty 1 Pouch A and 1 Pouch B into the disposable container    Add lukewarm drinking water to the top line of the container. Mix to dissolve    Refrigerate (mixed solution should be used within 24 hrs)  5.  Begin drinking the prep at 5:00 p.m. The MoviPrep container is divided by 4 marks.   Every 15 minutes drink the solution down to the next mark (approximately 8 oz) until the full liter is complete.   6.  Follow completed prep with 16 oz of clear liquid of your choice  (Nothing red or purple).  Continue to drink clear liquids until bedtime.  7.  Before going to bed, mix second dose of MoviPrep solution:    Empty 1 Pouch A and 1 Pouch B into the disposable container    Add lukewarm drinking water to the top line of the container. Mix to dissolve    Refrigerate  THE DAY OF YOUR PROCEDURE      DATE: 05/07/10 DAY: Friday  Beginning at 5:30 a.m. (5 hours before procedure):         1. Every 15 minutes, drink the solution down to the next mark (approx 8 oz) until the full liter is complete.  2. Follow completed prep with 16 oz. of clear liquid of your choice.    3. You may drink clear liquids until 8:30am (2 HOURS BEFORE PROCEDURE).   MEDICATION INSTRUCTIONS  Unless otherwise instructed, you should take regular prescription medications with a small sip of water   as early as possible the morning of your  procedure.       OTHER INSTRUCTIONS  You will need a responsible adult at least 65 years of age to accompany you and drive you home.   This person must remain in the waiting room during your procedure.  Wear loose fitting clothing that is easily removed.  Leave jewelry and other valuables at home.  However, you may wish to bring a book to read or  an iPod/MP3 player to listen to music as you wait for your procedure to start.  Remove all body piercing jewelry and leave at home.  Total time from sign-in until discharge is approximately 2-3 hours.  You should go home directly after your procedure and rest.  You can resume normal activities the  day after your procedure.  The day of your procedure you should not:   Drive   Make legal decisions   Operate machinery   Drink alcohol   Return to work  You will receive specific instructions about eating, activities and medications before you leave.    The above instructions have been reviewed and explained to me by   Marchelle Folks.    I fully understand and can verbalize these instructions  _____________________________ Date _________

## 2010-05-12 NOTE — Procedures (Signed)
Summary: EGD/[facility]  EGD/[facility]   Imported By: Sherian Rein 05/04/2010 13:50:30  _____________________________________________________________________  External Attachment:    Type:   Image     Comment:   External Document

## 2010-05-12 NOTE — Assessment & Plan Note (Signed)
Summary: post hosp, Gastroenteritis, family hx of Ca, will need colon/all   History of Present Illness Visit Type: Initial Visit Primary GI MD: Elie Goody MD [facility] Primary Provider: Marga Melnick,. MD Requesting Provider: n/a Chief Complaint: Patient here for f/u after hospitalization for gastroenteritis. Patient states that she feels better and denies any current GI symptoms. History of Present Illness:   Mrs Brandi Villarreal returns following hospitalization for a self-limited gastroenteritis approximately 2 weeks ago. Her symptoms resolved within 24 hours and she was discharged. She has no gastrointestinal complaints today. I reviewed the discharge summary, blood work and CT scan. CT showed a diffusely thickened colon, felt most likely due to under distention of the bowel, and cholelithiasis. Her brother developed colon cancer in his early 55s. Her last colonoscopy was in September 2006.   GI Review of Systems      Denies abdominal pain, acid reflux, belching, bloating, chest pain, dysphagia with liquids, dysphagia with solids, heartburn, loss of appetite, nausea, vomiting, vomiting blood, weight loss, and  weight gain.      Reports hemorrhoids.     Denies anal fissure, black tarry stools, change in bowel habit, constipation, diarrhea, diverticulosis, fecal incontinence, heme positive stool, irritable bowel syndrome, jaundice, light color stool, liver problems, rectal bleeding, and  rectal pain. Preventive Screening-Counseling & Management  Caffeine-Diet-Exercise     Does Patient Exercise: yes   Current Medications (verified): 1)  Aleve 220 Mg Tabs (Naproxen Sodium) .... One Tablet By Mouth Every 8 Hours As Needed For Pain 2)  Calcium-Vitamin D 600-200 Mg-Unit Tabs (Calcium-Vitamin D) .... One Tablet By Mouth Once Daily 3)  Multivitamins   Tabs (Multiple Vitamin) .... One Tablet By Mouth Once Daily 4)  Spirlina Herb (Unknown Dosage) .... Take 1 Tablet By Mouth Once A Day 5)  Alfamin   Tabs (Alfalfa) .... Take 2 Tablets By Mouth Once A Day 6)  Non Flushable Niacin  Herb (Unknown Dosage) .... Take 1 Tablet By Mouth Once A Day  Allergies: 1)  ! Pcn 2)  ! Sulfa 3)  ! Asa 4)  ! * Seafood 5)  ! Iodine 6)  ! * Beconase 7)  ! * Flonase 8)  ! * Azmacort 9)  ! Morphine 10)  ! * Mepragan Fortus  Past History:  Past Medical History: Osteoporosis Hemorrhoids Mitral Valve Prolapse Colitis ?1975 Cholelithiasis  Past Surgical History: Rhinoplasty Jaw resection Bilteral Tubal Ligation Tonsillectomy  Family History: Family History of Colon Cancer: Brother, 11s  Social History: Married Librarian, academic Patient has never smoked.  Alcohol Use - yes wine-rare Illicit Drug Use - no Daily Caffeine Use-1 cup daily Patient gets regular exercise. Does Patient Exercise:  yes  Review of Systems       The patient complains of urine leakage.         The pertinent positives and negatives are noted as above and in the HPI. All other ROS were reviewed and were negative.   Vital Signs:  Patient profile:   65 year old female Height:      62.5 inches Weight:      143 pounds BMI:     25.83 BSA:     1.67 Pulse rate:   76 / minute Pulse rhythm:   regular BP sitting:   102 / 70  (left arm)  Vitals Entered By: Lamona Curl CMA (AAMA) (May 04, 2010 10:10 AM)  Physical Exam  General:  Well developed, well nourished, no acute distress. Head:  Normocephalic and atraumatic. Eyes:  PERRLA, no icterus. Ears:  Normal auditory acuity. Mouth:  No deformity or lesions, dentition normal. Neck:  Supple; no masses or thyromegaly. Lungs:  Clear throughout to auscultation. Heart:  Regular rate and rhythm; no murmurs, rubs,  or bruits. Abdomen:  Soft, nontender and nondistended. No masses, hepatosplenomegaly or hernias noted. Normal bowel sounds. Rectal:  deferred until time of colonoscopy.   Msk:  Symmetrical with no gross deformities. Normal posture. Pulses:  Normal  pulses noted. Extremities:  No clubbing, cyanosis, edema or deformities noted. Neurologic:  Alert and  oriented x4;  grossly normal neurologically. Cervical Nodes:  No significant cervical adenopathy. Inguinal Nodes:  No significant inguinal adenopathy. Psych:  Alert and cooperative. Normal mood and affect.  Impression & Recommendations:  Problem # 1:  ABNORMAL FINDINGS GI TRACT (ICD-793.4) Abnormal diffusely thickened colon wall on CT scan, likely due to underdistention of the bowel. Rule out colitis, and other disorders. This may have been a self-limited change related to her recent gastroenteritis. The risks, benefits and alternatives to colonoscopy with possible biopsy and possible polypectomy were discussed with the patient and they consent to proceed. The procedure will be scheduled electively. Orders: Colonoscopy (Colon)  Problem # 2:  FAMILY HX COLON CANCER (ICD-V16.0) Brother with colon cancer in his 83s. She is due for colonoscopy as above. Orders: Colonoscopy (Colon)  Problem # 3:  CHOLELITHIASIS (ICD-574.2) Asymptomatic cholelithiasis. Expectant management.  Patient Instructions: 1)  Pick up prep from your pharmacy.  2)  Colonoscopy brochure given.  3)  Copy sent to : Marga Melnick, MD 4)  The medication list was reviewed and reconciled.  All changed / newly prescribed medications were explained.  A complete medication list was provided to the patient / caregiver.  Prescriptions: MOVIPREP 100 GM  SOLR (PEG-KCL-NACL-NASULF-NA ASC-C) As per prep instructions.  #1 x 0   Entered by:   Christie Nottingham CMA (AAMA)   Authorized by:   Meryl Dare MD [facility]   Signed by:   Christie Nottingham CMA (AAMA) on 05/04/2010   Method used:   Electronically to        [facility]  [phone]* (retail)       [address] [place], [place]  [address]       Ph: [phone] or [phone]       Fax: [phone]   RxID:   [id number]

## 2010-05-12 NOTE — Progress Notes (Signed)
Summary: Medication ?s  Phone Note Call from Patient Call back at Home Phone [phone] Call back at [phone]   Caller: Patient Call For: Dr. Russella Dar Reason for Call: Talk to Nurse Summary of Call: Pt has questions about her vitamins and if she can continue to take them before procedure Initial call taken by: [place] Johnson,  May 04, 2010 4:15 PM  Follow-up for Phone Call        all questions answered.  She will keep her colon appointment  for Friday.  She will call back for any further questions Follow-up by: Darcey Nora RN, CGRN,  May 04, 2010 4:47 PM

## 2010-05-12 NOTE — Letter (Signed)
Summary: [facility]   Imported By: Sherian Rein 05/04/2010 13:51:51  _____________________________________________________________________  External Attachment:    Type:   Image     Comment:   External Document

## 2010-05-20 NOTE — Procedures (Signed)
Summary: Colonoscopy   Colonoscopy  Procedure date:  05/07/2010  Findings:      Location:  [facility].    Procedures Next Due Date:    Colonoscopy: 05/2015 COLONOSCOPY PROCEDURE REPORT PATIENT:  Brandi Villarreal, Brandi Villarreal  MR#:  [id number] BIRTHDATE:   1945/04/07, 64 yrs. old   GENDER:   female ENDOSCOPIST:   Judie Petit T. Russella Dar, MD, [facility]   PROCEDURE DATE:  05/07/2010 PROCEDURE:  Colonoscopy 40981 ASA CLASS:   Class II INDICATIONS: 1) Elevated Risk Screening  2) family history of colon cancer: brother in his early 70s. MEDICATIONS:    Fentanyl 75 mcg IV, Versed 9 mg IV DESCRIPTION OF PROCEDURE:   After the risks benefits and alternatives of the procedure were thoroughly explained, informed consent was obtained.  Digital rectal exam was performed and revealed no abnormalities.   The LB [id number] endoscope was introduced through the anus and advanced to the cecum, which was identified by both the appendix and ileocecal valve, without limitations.  The quality of the prep was excellent, using MoviPrep.  The instrument was then slowly withdrawn as the colon was fully examined. <<PROCEDUREIMAGES>>          <<OLD IMAGES>> FINDINGS:  A normal appearing cecum, ileocecal valve, and appendiceal orifice were identified. The ascending, hepatic flexure, transverse, splenic flexure, descending, sigmoid colon, and rectum appeared unremarkable. Retroflexed views in the rectum revealed internal hemorrhoids, small. The time to cecum =  5.33  minutes. The scope was then withdrawn (time =  8.5  min) from the patient and the procedure completed.  COMPLICATIONS:   None  ENDOSCOPIC IMPRESSION:  1) Normal colon  2) Internal hemorrhoids  RECOMMENDATIONS:  1) Given your family history of colon cancer, you should have a repeat colonoscopy in 5 years    Myers Tutterow T. Russella Dar, MD, [facility]

## 2010-06-04 ENCOUNTER — Encounter: Payer: Self-pay | Admitting: Internal Medicine

## 2010-08-20 NOTE — Assessment & Plan Note (Signed)
[facility]                        [place] OFFICE NOTE   Brandi Villarreal                       MRN:          [id number]  DATE:08/09/2006                            DOB:          Oct 23, 1945    Brandi Villarreal was seen Aug 09, 2006 for persistent cough that has actually  been present since February when she was seen for bronchitis.  She was  originally seen February 15 and placed on Zithromax and Advair and  Tussionex.  She was also seen on February 25th with wheezing and  evidence of reactive airways disease, although there was no past history  of asthma.  She was placed on Combivent every 4 hours as needed which  she has used.   At this time, she denies fevers, chills, sweats, fatigue.  She had an  isolated headache in the right frontal area on May 5th.  She has had  nasal congestion and facial pain, but no obstruction, nasal purulence,  or anosmia.  She describes halitosis as chronic phenomena.  She has had  some dental pain, but she relates this to a prosthesis she wears.  She  has had some postnasal drainage; she denies earache.  Cough has been  productive, but only of plugs.   She had used [place], but questioned whether it might be causing eye  pain.  She  read the package insert and was concerned about glaucoma or  cataract formation.   She has a history of taking allergy shots, but  as dosage was  increasing, symptoms were not improving.  She had been told that she had  remote histo based on prior x-rays.   She denies any heartburn or reflux since she stopped the  bisphosphonates.   FAMILY HISTORY:  Reveals reactive airways disease and some chronic  obstructive lung disease in her daughter.   Other past medical history reveals a negative endoscopy.  Colonoscopy  has only revealed internal hemorrhoids. She has had oral surgery,  tonsillectomy, and 2 pregnancies.  She does have a history of colitis,  osteoporosis.   Weight  is essentially stable at 140.6.  She is afebrile.  Pulse is 64,  respiratory rate 15, and blood pressure 120/70.  The positive findings include dullness of the right tympanic membrane  and a dry cough.  She has full extraocular motion with no evidence of  conjunctivitis.  Nares are patent but dry.  The oropharynx reveals no  erythema.  She has no lymphadenopathy about the head, neck, or axilla.  The chest is clear although she does have intermittent cough.  Skin is warm and dry.   Chest x-ray does show minor calcified granuloma which would be  compatible with histoplasmosis of an inactive nature.  She did grow up  in [place].  The history if still suggestive of post infectious  reactive airways disease.There could also be a component of  laryngoesophageal reflux.   She is intolerant/allergic to PENICILLIN, SULFA, ASPIRIN, SEAFOOD,  IODINE, BECONASE AND FLONASE.   She has been under increased stress, and I questioned whether there is  silent reflux contributing to the  cough.  There is no evidence of  pneumonia or active infectious process.  I would recommend that she take  over-the-counter omeprazole 20 mg before breakfast and before the  evening meal.  Low-dose Advair every 12 hours was prescribed.  An  abbreviated course of prednisone would help obliterate the cough.  Long  term steroids are not advisable in view of her osteoporosis.  There  would be no risks of cataracts or glaucoma using the low-dose Advair.  The sputum production she describes is classic for Cruschman's spirals  which are the casts of the airway due to bronchospasm.     Titus Dubin. Alwyn Ren, MD,FACP,FCCP  Electronically Signed    [facility]  DD: 08/09/2006  DT: 08/09/2006  Job #: [id number]

## 2010-08-20 NOTE — Assessment & Plan Note (Signed)
[facility]                                 ON-CALL NOTE   CAIDENCE, KASEMAN                       MRN:          [id number]  DATE:08/12/2006                            DOB:          October 30, 1945    She has been coughing all night.  Was seen for the cough Wednesday by  Dr. Alwyn Ren and put on Advair, Tessalon pearls, Combivent, Flonase, and  prednisone, and Prilosec OTC.  She has been doing all this, states her  cough is getting worse.  She is refusing to come into the clinic today.  She is coughing up only white mucus.  An x-ray was done.  We did review  Dr. Frederik Pear note in the chart, and on the computer, and there was no  infectious process observed.  I told her I could call in some Robitussin  HC for her 6 ounces 1 to 2 teaspoons at night, and she would have to go  to Urgent Care or follow up with Dr. Alwyn Ren next week if her symptoms do  not improve.     Lelon Perla, DO  Electronically Signed    Shawnie Dapper  DD: 08/12/2006  DT: 08/12/2006  Job #: [id number]   cc:   Titus Dubin. Alwyn Ren, MD,FACP,FCCP

## 2010-10-14 ENCOUNTER — Encounter: Payer: Self-pay | Admitting: Internal Medicine

## 2010-10-15 ENCOUNTER — Ambulatory Visit: Payer: BC Managed Care – PPO | Admitting: Internal Medicine

## 2010-10-19 ENCOUNTER — Ambulatory Visit (INDEPENDENT_AMBULATORY_CARE_PROVIDER_SITE_OTHER): Payer: BC Managed Care – PPO | Admitting: Internal Medicine

## 2010-10-19 ENCOUNTER — Encounter: Payer: Self-pay | Admitting: Internal Medicine

## 2010-10-19 DIAGNOSIS — R05 Cough: Secondary | ICD-10-CM

## 2010-10-19 DIAGNOSIS — S99929A Unspecified injury of unspecified foot, initial encounter: Secondary | ICD-10-CM

## 2010-10-19 NOTE — Patient Instructions (Signed)
For sinus congestion use Plain Mucinex for thick secretions ;force NON dairy fluids, & use a Neti pot daily as needed . Normal T scores on a bone density exam (BMD)  are +1 to -1. Osteopenia would be -1.1 to -2.4. Osteoporosis is defined by a  T score worse than -2.4. Treatment should be considered  with  T scores worse than -1.5, particularly if there is  family history of low bone density or personal  past history of atypical fractue.BMD should be monitored every 25 months. Recommended lifestyle interventions to prevent  Osteoporosis include calcium 600 mg twice a day  & vitamin D3 supplementation to keep vitamin  D  level @ least 40-60. The usual vitamin D3 dose is 1000 IU daily; but individual dose is determined by annual vitamin D level monitor. Also weight bearing exercise such as  walking 30-45 minutes 3-4  X per week is recommended.   To make sterile saline,  boil 1 cup salt in 1 gallon of water for use as soaks after it cools

## 2010-10-19 NOTE — Progress Notes (Signed)
  Subjective:    Patient ID: Brandi Villarreal, female    DOB: 08-09-1945, 65 y.o.   MRN: [id number]  HPI  #1 The cough began 3 weeks ago after exposure to debris from a rug her husband had shaken out  while [place] were [place]  the [place]. She had some extrinsic symptoms with itchy eyes and sneezing. She facial pressure and nasal congestion. She experienced paroxysmal coughing with some nonpurulent sputum.  She treated this with supplements and over-the-counter medications. The cough now has resolved as of 7/15.  She has no residual symptoms of rhinosinusitis. PMH: no asthma; she previously took allergy shots  #2 5- 6 weeks ago her daughter stepped on her right great toe. Blood actually expressed from under the nail. The large toe and the second and third toes were bruised. She wrapped the toe but treated and soaked it in Epsom salts  She denies any fever, chills, sweats or red streaks going up the toe.  She had a bone  density study in March; there may have been some progression of her osteopenia.    Review of Systems     Objective:   Physical Exam General appearance is of good health and nourishment; no acute distress or increased work of breathing is present.  No  lymphadenopathy about the head, neck, or axilla noted.   Eyes: No conjunctival inflammation or lid edema is present. There is no scleral icterus.  Ears:  External ear exam shows no significant lesions or deformities.  Otoscopic examination reveals clear canals, tympanic membranes are intact bilaterally without bulging, retraction, inflammation or discharge.  Nose:  External nasal examination shows no deformity or inflammation. Nasal mucosa are pink and moist without lesions or exudates. No septal dislocation or dislocation.No obstruction to airflow.   Oral exam: Dental hygiene is good; lips and gums are healthy appearing.There is no oropharyngeal erythema or exudate noted.   Neck:  No deformities, thyromegaly, masses, or tenderness  noted.   Supple with full range of motion without pain.   Heart:  Normal rate and regular rhythm. S1 and S2 normal without gallop, murmur, click, rub or other extra sounds.   Lungs:Chest clear to auscultation; no wheezes, rhonchi,rales ,or rubs present.No increased work of breathing.    Extremities:  No cyanosis, edema, or clubbing  noted . Toe not tender to palpation. Circulation is excellent in the feet. There is no evidence of cellulitis of the right great toe. Venous spiders of legs   Skin: Warm & dry w/o jaundice or tenting.         Assessment & Plan:  #1 cough in the context of a pursestring tract infection, possibly dust induced. Symptoms essentially resolved. Based on her history should symptoms recur she may be a candidate for an agent such as Singulair 10 mg daily.  #2 status post trauma to the right great toe. There is no evidence of cellulitis. Surprisingly the nail bed looks very healthy and she is unlikely to lose the nail.  #3 osteopenia, as per  [facility]

## 2011-03-24 ENCOUNTER — Encounter: Payer: Self-pay | Admitting: Internal Medicine

## 2011-03-24 ENCOUNTER — Ambulatory Visit (INDEPENDENT_AMBULATORY_CARE_PROVIDER_SITE_OTHER): Payer: Medicare Other | Admitting: Internal Medicine

## 2011-03-24 DIAGNOSIS — J069 Acute upper respiratory infection, unspecified: Secondary | ICD-10-CM

## 2011-03-24 DIAGNOSIS — J209 Acute bronchitis, unspecified: Secondary | ICD-10-CM

## 2011-03-24 MED ORDER — AZITHROMYCIN 250 MG PO TABS: ORAL_TABLET | ORAL | Status: AC

## 2011-03-24 MED ORDER — HYDROCODONE-HOMATROPINE 5-1.5 MG/5ML PO SYRP: 5.0000 mL | ORAL_SOLUTION | Freq: Four times a day (QID) | ORAL | Status: AC | PRN

## 2011-03-24 NOTE — Progress Notes (Signed)
  Subjective:    Patient ID: Brandi Villarreal, female    DOB: 1945-12-19, 65 y.o.   MRN: [id number]  HPI Respiratory tract infection Onset/symptoms:head congestion & itchy eyes 12/12 Exposures (illness/environmental/extrinsic):sick granddaughter; ? Reaction to Feverfew Progression of symptoms:rhinitis & chest congestion 12/17 Treatments/response:Tylenol Cold, Chloraseptic with marginal response; Neti pot Present symptoms: Fever/chills/sweats:chills only Frontal headache:yes Facial pain:yes Nasal purulence:now white ,prev yellow Sore throat:not now Dental pain:no Lymphadenopathy:yes Wheezing/shortness of breath:no Cough/sputum/hemoptysis:white now,previously green Pleuritic pain:no Past medical history: Seasonal allergies:yes , prev on shots /asthma:no Smoking history:never           Review of Systems     Objective:   Physical Exam General appearance; thin but in good health and nourishment; no acute distress or increased work of breathing is present.  No  lymphadenopathy about the head, neck, or axilla noted.   Eyes: No conjunctival inflammation or lid edema is present.  Ears:  External ear exam shows no significant lesions or deformities.  Otoscopic examination reveals clear canals, tympanic membranes are intact bilaterally without bulging, retraction, inflammation or discharge.  Nose:  External nasal examination shows no deformity or inflammation. Nasal mucosa are pink and moist without lesions or exudates. No septal dislocation .No obstruction to airflow.   Oral exam: Dental hygiene is good; lips and gums are healthy appearing.There is no oropharyngeal erythema or exudate noted.      Heart:  Normal rate and regular rhythm. S1 and S2 normal without gallop, murmur, click, rub or other extra sounds.   Lungs:Chest clear to auscultation; no wheezes, rhonchi,rales ,or rubs present.No increased work of breathing.    Extremities:  No cyanosis, edema, or clubbing  noted     Skin: Warm & dry           Assessment & Plan:   #1 bronchitis with some upper respiratory tract symptoms. Problematic is her documented allergies to multiple meds.  Plan: See orders and recommendations.

## 2011-03-24 NOTE — Patient Instructions (Signed)
Plain Mucinex for thick secretions ;force NON dairy fluids. Use a Neti pot daily as needed for sinus congestion . Zicam Melts or Zinc lozenges ; vitamin C 2000 mg daily; & Echinacea for 4-7 days. Report fever, exudate("pus") or progressive pain.

## 2012-01-12 ENCOUNTER — Encounter: Payer: Self-pay | Admitting: Gastroenterology

## 2012-01-12 ENCOUNTER — Ambulatory Visit (INDEPENDENT_AMBULATORY_CARE_PROVIDER_SITE_OTHER): Payer: Medicare Other | Admitting: Gastroenterology

## 2012-01-12 VITALS — BP 120/64 | HR 76 | Ht 62.5 in | Wt 135.0 lb

## 2012-01-12 DIAGNOSIS — K8020 Calculus of gallbladder without cholecystitis without obstruction: Secondary | ICD-10-CM

## 2012-01-12 DIAGNOSIS — R112 Nausea with vomiting, unspecified: Secondary | ICD-10-CM

## 2012-01-12 NOTE — Progress Notes (Signed)
History of Present Illness: This is a 66 year old female who relates intermittent problems with abdominal bloating, nausea and vomiting. She states her symptoms generally occur in the evening. She will occasionally have upper abdominal pain associated with her other symptoms. Her symptoms have occurred about every 3-4 weeks for the past 1-1/2 years. Cholelithiasis was previously identified on CT scan in January 2012. She describes a mid thoracic back discomfort and a headache in the back of her head. These symptoms generally occur at night. She is concerned about parasites. Denies weight loss, constipation, diarrhea, change in stool caliber, melena, hematochezia, dysphagia, reflux symptoms, chest pain.  Current Medications, Allergies, Past Medical History, Past Surgical History, Family History and Social History were reviewed in [employer] record.  Physical Exam: General: Well developed , well nourished, no acute distress Head: Normocephalic and atraumatic Eyes:  sclerae anicteric, EOMI Ears: Normal auditory acuity Mouth: No deformity or lesions Lungs: Clear throughout to auscultation Heart: Regular rate and rhythm; no murmurs, rubs or bruits Abdomen: Soft, non tender and non distended. No masses, hepatosplenomegaly or hernias noted. Normal Bowel sounds Musculoskeletal: Symmetrical with no gross deformities  Pulses:  Normal pulses noted Extremities: No clubbing, cyanosis, edema or deformities noted Neurological: Alert oriented x 4, grossly nonfocal Psychological:  Alert and cooperative. Normal mood and affect  Assessment and Recommendations:  1. Intermittent nausea and vomiting associated with abdominal bloating, upper abdominal pain. Rule out GERD, ulcer disease, symptomatic cholelithiasis. Schedule upper endoscopy. The risks, benefits, and alternatives to endoscopy with possible biopsy and possible dilation were discussed with the patient and they consent to proceed.    If not diagnostic plan to proceed with surgical referral.   2. Back pain and headache. Followup with Dr. Alwyn Ren.

## 2012-01-12 NOTE — Patient Instructions (Addendum)
You have been scheduled for an endoscopy with propofol. Please follow written instructions given to you at your visit today. If you use inhalers (even only as needed), please bring them with you on the day of your procedure.  Schedule an appointment with Dr. Alwyn Ren to address your other symptoms.   cc: Marga Melnick, MD

## 2012-01-17 ENCOUNTER — Encounter: Payer: Medicare Other | Admitting: Gastroenterology

## 2012-01-26 ENCOUNTER — Telehealth: Payer: Self-pay | Admitting: Gastroenterology

## 2012-01-26 NOTE — Telephone Encounter (Signed)
Pt informed we recommend no red or dark nail polish due to the difficulty of the pulse oximeter reading through that polish. Pt states it is a process to have this removed and she would rather not have it taken off. Asked [employer] manager about this and she states that pt doesn't have to have it removed , CRNA can use the ear or maybe it will read through the polish. Pt informed of such, told again we dont recommend the red polish but would leave it up to the pt after she has received the information. ewm

## 2012-01-27 ENCOUNTER — Telehealth: Payer: Self-pay | Admitting: *Deleted

## 2012-01-27 ENCOUNTER — Encounter: Payer: Self-pay | Admitting: Gastroenterology

## 2012-01-27 ENCOUNTER — Ambulatory Visit (AMBULATORY_SURGERY_CENTER): Payer: Medicare Other | Admitting: Gastroenterology

## 2012-01-27 VITALS — BP 119/48 | HR 60 | Temp 97.0°F | Resp 26 | Ht 62.5 in | Wt 135.0 lb

## 2012-01-27 DIAGNOSIS — K2990 Gastroduodenitis, unspecified, without bleeding: Secondary | ICD-10-CM

## 2012-01-27 DIAGNOSIS — K2970 Gastritis, unspecified, without bleeding: Secondary | ICD-10-CM

## 2012-01-27 DIAGNOSIS — R112 Nausea with vomiting, unspecified: Secondary | ICD-10-CM

## 2012-01-27 DIAGNOSIS — D131 Benign neoplasm of stomach: Secondary | ICD-10-CM

## 2012-01-27 MED ORDER — SODIUM CHLORIDE 0.9 % IV SOLN: 500.0000 mL | INTRAVENOUS | Status: DC

## 2012-01-27 NOTE — Progress Notes (Signed)
Patient did not have preoperative order for IV antibiotic SSI prophylaxis. (G8918)  Patient did not experience any of the following events: a burn prior to discharge; a fall within the facility; wrong site/side/patient/procedure/implant event; or a hospital transfer or hospital admission upon discharge from the facility. (G8907)  

## 2012-01-27 NOTE — Op Note (Signed)
[facility] [address] [place], [address]   ENDOSCOPY PROCEDURE REPORT  PATIENT: Brandi Villarreal, Brandi Villarreal  MR#: [id number] BIRTHDATE: 03-12-1946 , 66  yrs. old GENDER: Female ENDOSCOPIST: Meryl Dare, MD, [facility] PROCEDURE DATE:  01/27/2012 PROCEDURE:  EGD w/ biopsy ASA CLASS:     Class II INDICATIONS:  vomiting. MEDICATIONS: MAC sedation, administered by CRNA and propofol (Diprivan) 120mg  IV TOPICAL ANESTHETIC: Cetacaine Spray DESCRIPTION OF PROCEDURE: After the risks benefits and alternatives of the procedure were thoroughly explained, informed consent was obtained.  The [facility] GIF-H180 [id number] endoscope was introduced through the mouth and advanced to the second portion of the duodenum. Without limitations.  The instrument was slowly withdrawn as the mucosa was fully examined.  STOMACH: Mild gastritis (inflammation) was found in the gastric antrum and gastric body. There was patchy erythema. Multiple Multiple biopsies were performed.  The stomach otherwise appeared normal. ESOPHAGUS: The mucosa of the esophagus appeared normal. DUODENUM: The duodenal mucosa showed no abnormalities in the bulb and second portion of the duodenum.  Retroflexed views revealed a hiatal hernia.   The scope was then withdrawn from the patient and the procedure completed.  COMPLICATIONS: There were no complications.  ENDOSCOPIC IMPRESSION: 1.   Mild gastritis; multiple biopsies 2.   Hiatal Hernia  RECOMMENDATIONS: 1.  anti-reflux regimen 2.  await pathology results 3.  continue PPI   eSigned:  Meryl Dare, MD, [facility] 01/27/2012 11:58 AM

## 2012-01-27 NOTE — Patient Instructions (Addendum)
YOU HAD AN ENDOSCOPIC PROCEDURE TODAY AT THE [facility]: Refer to the procedure report that was given to you for any specific questions about what was found during the examination.  If the procedure report does not answer your questions, please call your gastroenterologist to clarify.  If you requested that your care partner not be given the details of your procedure findings, then the procedure report has been included in a sealed envelope for you to review at your convenience later.  YOU SHOULD EXPECT: Some feelings of bloating in the abdomen. Passage of more gas than usual.  Walking can help get rid of the air that was put into your GI tract during the procedure and reduce the bloating. If you had a lower endoscopy (such as a colonoscopy or flexible sigmoidoscopy) you may notice spotting of blood in your stool or on the toilet paper. If you underwent a bowel prep for your procedure, then you may not have a normal bowel movement for a few days.  DIET: Your first meal following the procedure should be a light meal and then it is ok to progress to your normal diet.  A half-sandwich or bowl of soup is an example of a good first meal.  Heavy or fried foods are harder to digest and may make you feel nauseous or bloated.  Likewise meals heavy in dairy and vegetables can cause extra gas to form and this can also increase the bloating.  Drink plenty of fluids but you should avoid alcoholic beverages for 24 hours.  ACTIVITY: Your care partner should take you home directly after the procedure.  You should plan to take it easy, moving slowly for the rest of the day.  You can resume normal activity the day after the procedure however you should NOT DRIVE or use heavy machinery for 24 hours (because of the sedation medicines used during the test).    SYMPTOMS TO REPORT IMMEDIATELY: A gastroenterologist can be reached at any hour.  During normal business hours, 8:30 AM to 5:00 PM Monday through Friday,  call [phone].  After hours and on weekends, please call the GI answering service at [phone] who will take a message and have the physician on call contact you.  Following upper endoscopy (EGD)  Vomiting of blood or coffee ground material  New chest pain or pain under the shoulder blades  Painful or persistently difficult swallowing  New shortness of breath  Fever of 100F or higher  Black, tarry-looking stools  FOLLOW UP: If any biopsies were taken you will be contacted by phone or by letter within the next 1-3 weeks.  Call your gastroenterologist if you have not heard about the biopsies in 3 weeks.  Our staff will call the home number listed on your records the next business day following your procedure to check on you and address any questions or concerns that you may have at that time regarding the information given to you following your procedure. This is a courtesy call and so if there is no answer at the home number and we have not heard from you through the emergency physician on call, we will assume that you have returned to your regular daily activities without incident.  SIGNATURES/CONFIDENTIALITY: You and/or your care partner have signed paperwork which will be entered into your electronic medical record.  These signatures attest to the fact that that the information above on your After Visit Summary has been reviewed and is understood.  Full responsibility of   the confidentiality of this discharge information lies with you and/or your care-partner. 

## 2012-01-27 NOTE — Telephone Encounter (Signed)
Left patient a message that Dr. Russella Dar would have Brandi Villarreal, his office nurse, to call her regarding follow-up with surgeon for gallbladder condition.

## 2012-01-30 ENCOUNTER — Telehealth: Payer: Self-pay

## 2012-01-30 ENCOUNTER — Telehealth: Payer: Self-pay | Admitting: *Deleted

## 2012-01-30 DIAGNOSIS — K8020 Calculus of gallbladder without cholecystitis without obstruction: Secondary | ICD-10-CM

## 2012-01-30 NOTE — Telephone Encounter (Signed)
Called number with  [employer] and left a message as directed per pt that identifies pt by name and also tried second number which was a work number that stated the company was closed  at this time. ewm

## 2012-01-30 NOTE — Telephone Encounter (Signed)
Message copied by Annett Fabian on Mon Jan 30, 2012  4:54 PM ------      Message from: Claudette Head T      Created: Fri Jan 27, 2012  1:23 PM       Needs surgical consult for cholelithiasis, probably symptomatic with recurrent N/V. Had EGD today showing only mild gastritis and a HH.

## 2012-01-30 NOTE — Telephone Encounter (Signed)
Patient is scheduled for consult with Dr. Carolynne Edouard @ [facility] for 02/13/12 8:45 arrival for a 9:10 appointment.  Her husband is given the information.  He will have her call back for any questions or concerns

## 2012-01-31 ENCOUNTER — Encounter: Payer: Self-pay | Admitting: Gastroenterology

## 2012-02-13 ENCOUNTER — Ambulatory Visit (INDEPENDENT_AMBULATORY_CARE_PROVIDER_SITE_OTHER): Payer: Self-pay | Admitting: General Surgery

## 2012-03-20 ENCOUNTER — Ambulatory Visit (INDEPENDENT_AMBULATORY_CARE_PROVIDER_SITE_OTHER): Payer: Medicare Other | Admitting: General Surgery

## 2012-03-20 ENCOUNTER — Encounter (INDEPENDENT_AMBULATORY_CARE_PROVIDER_SITE_OTHER): Payer: Self-pay | Admitting: General Surgery

## 2012-03-20 VITALS — BP 120/78 | HR 60 | Temp 97.0°F | Resp 18 | Ht 62.0 in | Wt 137.0 lb

## 2012-03-20 DIAGNOSIS — K8020 Calculus of gallbladder without cholecystitis without obstruction: Secondary | ICD-10-CM

## 2012-03-20 NOTE — Progress Notes (Signed)
Subjective:     Patient ID: Brandi Villarreal, female   DOB: [birth year], 66 y.o.   MRN: [id number]  HPI We are asked to see the patient in consultation by Dr. Russella Dar to evaluate her for gallstones. The patient is a 66 her white female who has been experiencing periodic nausea and vomiting. Her last episode of vomiting occurred on September. She states it depends on what she eats as to whether she'll experience nausea and vomiting. She does report some bloating and some abdominal discomfort but she is not sure about right upper quadrant pain. She also notes some occasional left shoulder pain and some paresthesias of her scalp. She did have a CT scan approximately 2 years ago that showed a single gallstone in the gallbladder.  Review of Systems  Constitutional: Negative.   HENT: Negative.   Eyes: Negative.   Respiratory: Negative.   Cardiovascular: Negative.   Gastrointestinal: Positive for nausea, vomiting and abdominal distention.  Genitourinary: Negative.   Musculoskeletal: Negative.   Skin: Negative.   Neurological: Negative.   Hematological: Negative.   Psychiatric/Behavioral: Negative.        Objective:   Physical Exam  Constitutional: She is oriented to person, place, and time. She appears well-developed and well-nourished.  HENT:  Head: Normocephalic and atraumatic.  Eyes: Conjunctivae normal and EOM are normal. Pupils are equal, round, and reactive to light.  Neck: Normal range of motion. Neck supple.  Cardiovascular: Normal rate, regular rhythm and normal heart sounds.   Pulmonary/Chest: Effort normal and breath sounds normal.  Abdominal: Soft. Bowel sounds are normal. She exhibits no mass. There is no tenderness.  Musculoskeletal: Normal range of motion.  Neurological: She is alert and oriented to person, place, and time.  Skin: Skin is warm and dry.  Psychiatric: She has a normal mood and affect. Her behavior is normal.       Assessment:     The patient had a single  gallstone noted on a CT scan from 2 years ago. It is certainly possible that some of her nausea and vomiting could be related to this. Because of this a figure be reasonable to try to remove the gallbladder. I also think it would be reasonable to repeat an ultrasound to make sure that her gallbladder has a similar appearance since the study is 66 years old. I've discussed with her in detail the risk and benefits of the operation to remove the gallbladder as well as some of the technical aspects and she understands. She would like to think about it and then she will let us know what her wishes are.    Plan:     We will call her with the results of her ultrasound and then proceed accordingly.

## 2012-03-20 NOTE — Patient Instructions (Signed)
Will call with results of ultrasound.

## 2012-03-21 ENCOUNTER — Ambulatory Visit
Admission: RE | Admit: 2012-03-21 | Discharge: 2012-03-21 | Disposition: A | Discharge status: Home / Self Care | Payer: Medicare Other | Source: Ambulatory Visit | Attending: General Surgery | Admitting: General Surgery

## 2012-03-21 DIAGNOSIS — K8020 Calculus of gallbladder without cholecystitis without obstruction: Secondary | ICD-10-CM

## 2012-03-21 IMAGING — US US ABDOMEN COMPLETE
1 series · 14 of 25 positions shown · non-contrast
Comparison: Previous abdomen and pelvis CT, [DATE]

CLINICAL DATA: Abdominal pain on and off for over 1 year.
Cholelithiasis.

COMPLETE ABDOMINAL ULTRASOUND

[Series 1: us abdomen complete · 0.28mm/px · 14 of 69 slices shown]
[im 1/69]
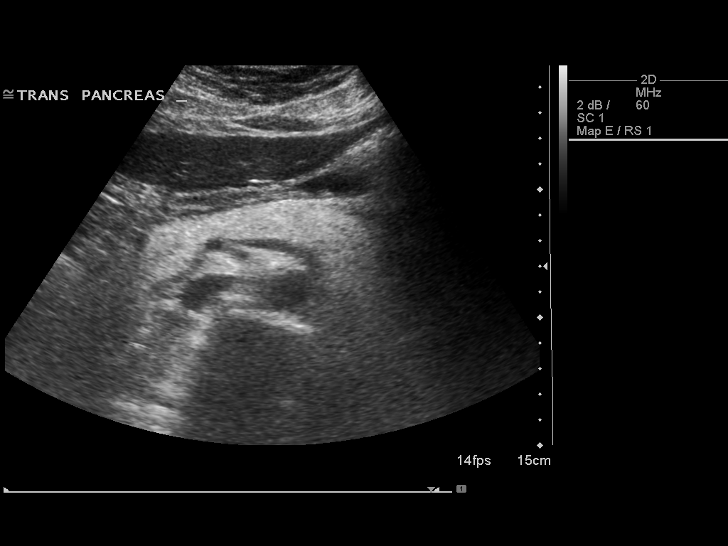
[im 6/69]
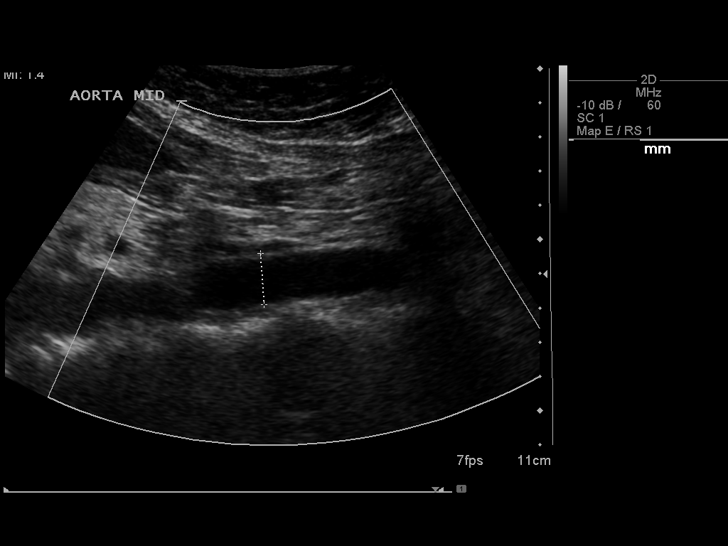
[im 12/69]
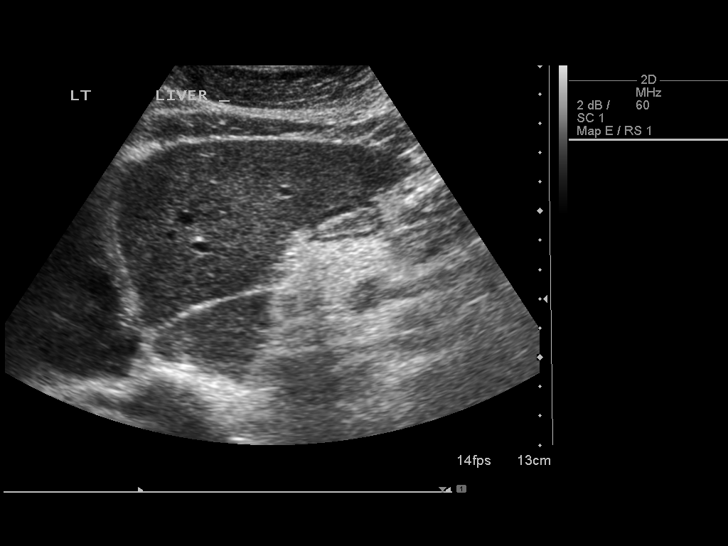
[im 18/69]
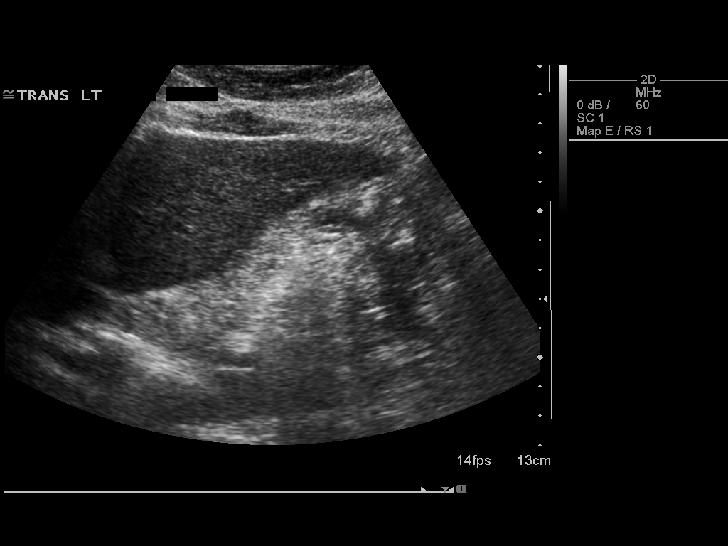
[im 23/69]
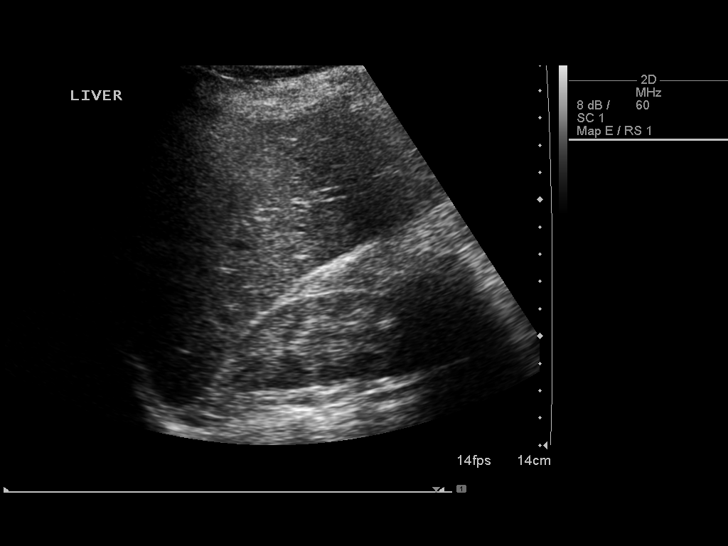
[im 26/69]
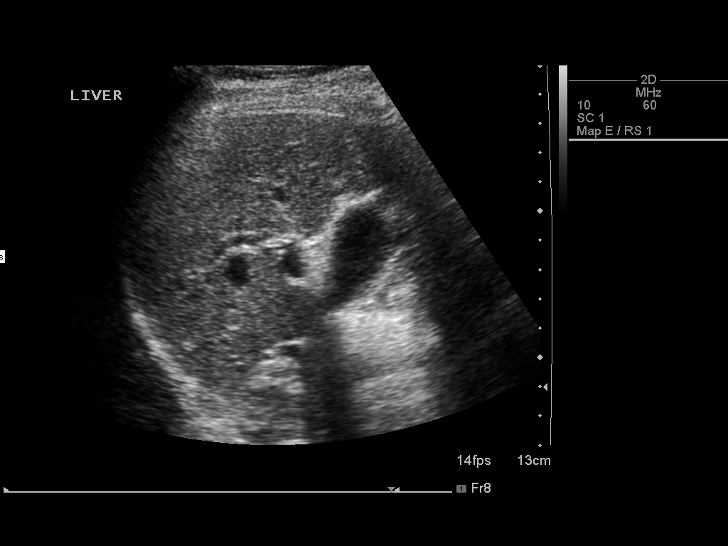
[im 32/69]
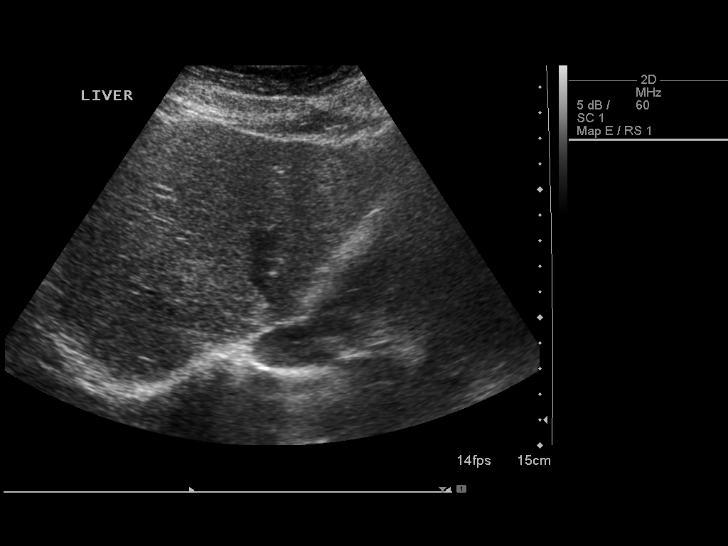
[im 37/69]
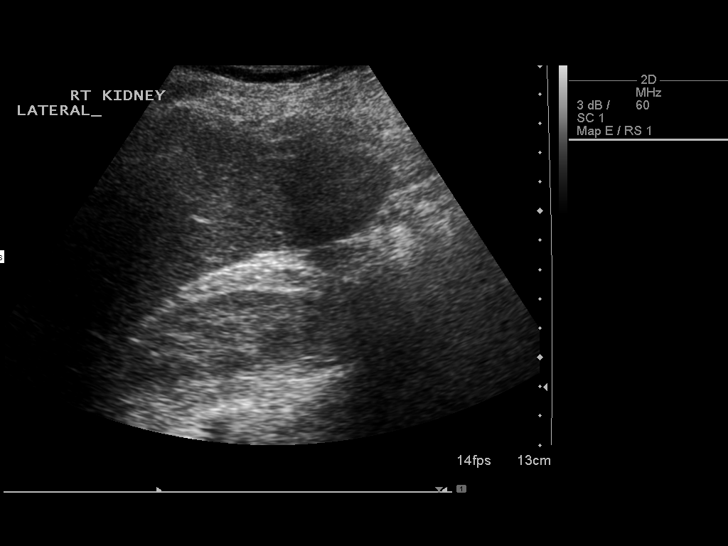
[im 43/69]
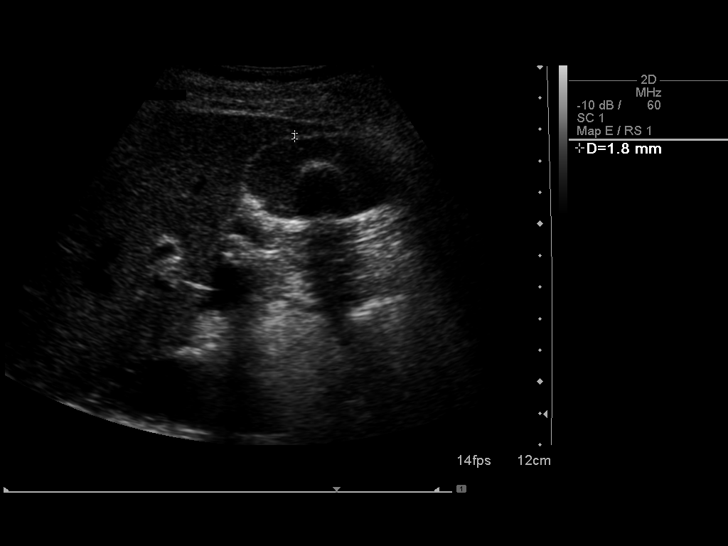
[im 46/69]
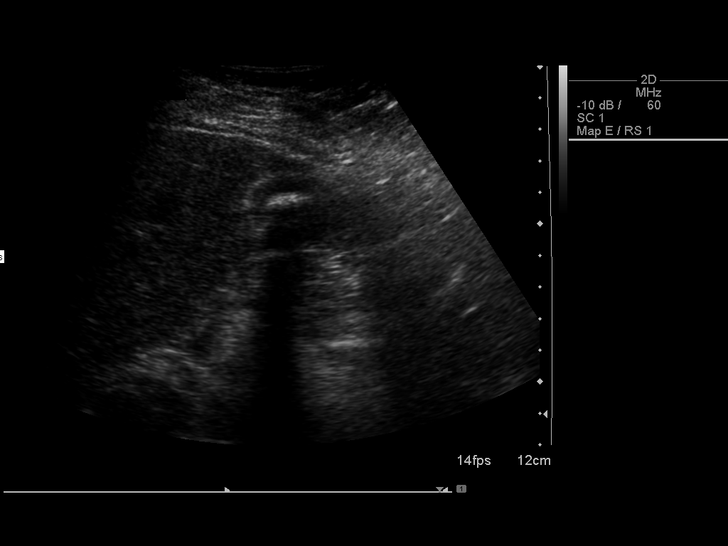
[im 52/69]
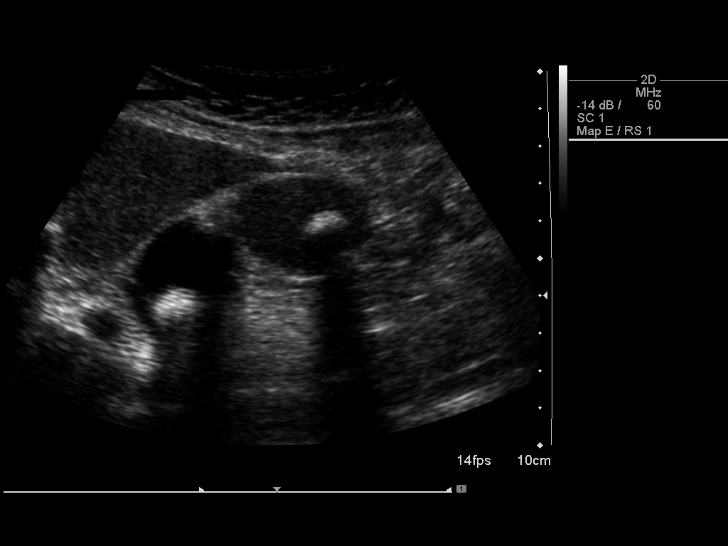
[im 57/69]
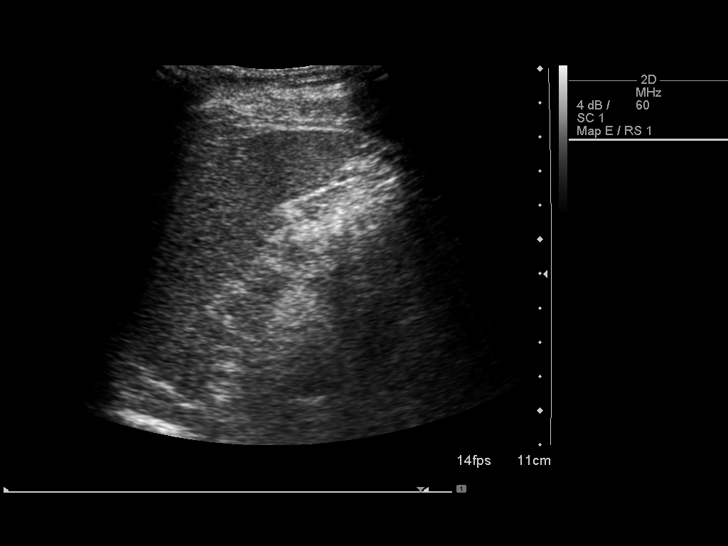
[im 63/69]
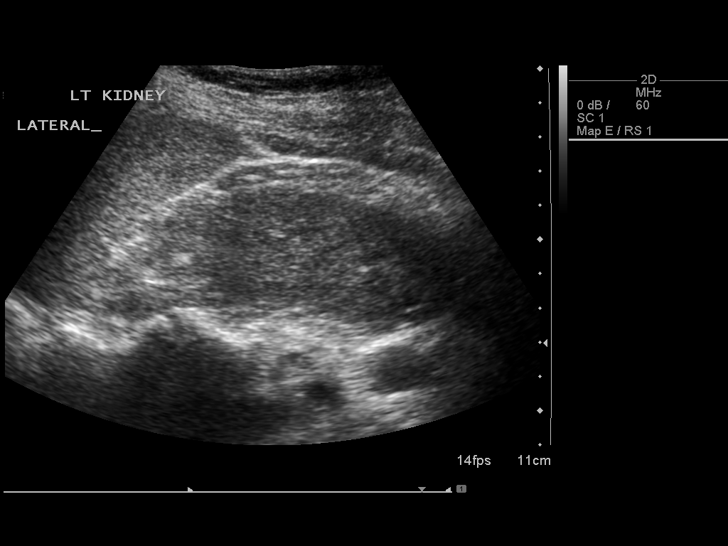
[im 69/69]
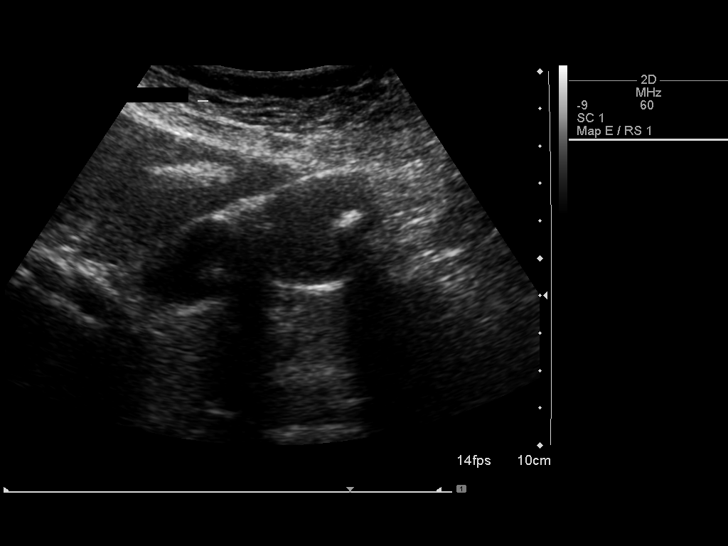

[14 of 25 positions shown; findings below may reference images not displayed]

FINDINGS: Gallbladder:  Multiple gallstones and dependent sludge, but no wall
thickening or pericholecystic fluid.  No evidence of acute
cholecystitis.

Common bile duct:  Normal in caliber with no stones.  The common
bile duct measures 3.3 mm.

Liver:  No focal lesion identified.  Within normal limits in
parenchymal echogenicity.

IVC:  Appears normal.

Pancreas:  No focal abnormality seen.

Spleen:  Normal in size and echogenicity measuring 6 cm.

Right Kidney:  Normal in size and echogenicity with no masses,
stones or hydronephrosis.  The kidney measures 10.3 cm.

Left Kidney:  Normal in size and echogenicity with no masses,
stones or hydronephrosis.  The kidney measures 10.3 cm.

Abdominal aorta:  No aneurysm identified.
IMPRESSION: Gallstones and gallbladder sludge, but no evidence of acute
cholecystitis.

No other abnormalities.

## 2012-03-22 ENCOUNTER — Telehealth (INDEPENDENT_AMBULATORY_CARE_PROVIDER_SITE_OTHER): Payer: Self-pay | Admitting: General Surgery

## 2012-03-22 NOTE — Telephone Encounter (Signed)
Pt returned my call and I informed her of her [place] report showing gallstones still.  She explained that she would like to hold off on surgery until after the first of the year.  I informed her to call us when she is ready to schedule.  She also wanted me to mail her a copy of her [place] report.  I mailed it out today.

## 2012-03-22 NOTE — Telephone Encounter (Signed)
Message copied by Littie Deeds on Thu Mar 22, 2012  2:03 PM ------      Message from: Caleen Essex III      Created: Thu Mar 22, 2012  7:18 AM       She still has her gallstones

## 2012-03-22 NOTE — Telephone Encounter (Signed)
LMOM asking pt to return my call. °

## 2013-05-09 ENCOUNTER — Encounter: Payer: Self-pay | Admitting: Internal Medicine

## 2013-05-09 ENCOUNTER — Ambulatory Visit (INDEPENDENT_AMBULATORY_CARE_PROVIDER_SITE_OTHER): Payer: Medicare Other | Admitting: Internal Medicine

## 2013-05-09 VITALS — BP 118/72 | HR 74 | Temp 97.9°F | Resp 16 | Wt 144.1 lb

## 2013-05-09 DIAGNOSIS — J209 Acute bronchitis, unspecified: Secondary | ICD-10-CM

## 2013-05-09 MED ORDER — BENZONATATE 200 MG PO CAPS: 200.0000 mg | ORAL_CAPSULE | Freq: Three times a day (TID) | ORAL | Status: DC | PRN

## 2013-05-09 MED ORDER — AZITHROMYCIN 250 MG PO TABS: ORAL_TABLET | ORAL | Status: DC

## 2013-05-09 NOTE — Progress Notes (Signed)
   Subjective:    Patient ID: Brandi Villarreal, female    DOB: 04/06/1945, 68 y.o.   MRN: [id number]  HPI    Her symptoms began 7-10 days ago after exposure to ill relatives. In fact her father- in-law & mother-in-law both had pneumonia and flu. Her father who all died.  She now has paroxysmal coughing lasting up to 2 hours  Sputum was yellow until yesterday; as of today it is clear  Using a Neti pot and tea had been a partial benefit  She describes some chills.  She has never smoked  No Flu shot        Review of Systems She specifically denies fever, chills, pleuritic pain, nasal purulence, sinus pain, sneezing, wheezing, hemoptysis, or dyspnea.  She has no significant reflux symptoms.     Objective:   Physical Exam General appearance:good health ;well nourished; no acute distress or increased work of breathing is present.  No  lymphadenopathy about the head, neck, or axilla noted.   Eyes: No conjunctival inflammation or lid edema is present. Ears:  External ear exam shows no significant lesions or deformities.  Otoscopic examination reveals clear canals, tympanic membranes are intact bilaterally without bulging, retraction, inflammation or discharge.  Nose:  External nasal examination shows no deformity or inflammation. Nasal mucosa are pink and moist without lesions or exudates. Slight R septal dislocation or deviation.No obstruction to airflow.   Oral exam: Dental hygiene is good; lips and gums are healthy appearing.There are oropharyngeal petechiae ;no exudate noted.   Neck:  No deformities,  masses, or tenderness noted.    Heart:  Normal rate and regular rhythm. S1 and S2 normal without gallop, murmur, click, rub or other extra sounds.   Lungs:Chest clear to auscultation; no wheezes, rhonchi,rales ,or rubs present.No increased work of breathing.  Dry cough  Extremities:  No cyanosis, edema, or clubbing  noted    Skin: Warm & dry          Assessment & Plan:    #1 acute bronchitis w/o bronchospasm  Plan: See orders and recommendations

## 2013-05-09 NOTE — Progress Notes (Signed)
Pre visit review using our clinic review tool, if applicable. No additional management support is needed unless otherwise documented below in the visit note. 

## 2013-05-09 NOTE — Patient Instructions (Signed)

## 2014-03-19 ENCOUNTER — Encounter: Payer: Self-pay | Admitting: Internal Medicine

## 2014-03-19 ENCOUNTER — Ambulatory Visit (INDEPENDENT_AMBULATORY_CARE_PROVIDER_SITE_OTHER): Payer: Medicare Other | Admitting: Internal Medicine

## 2014-03-19 VITALS — BP 110/78 | HR 61 | Temp 98.0°F | Resp 16 | Ht 62.0 in | Wt 150.0 lb

## 2014-03-19 DIAGNOSIS — J069 Acute upper respiratory infection, unspecified: Secondary | ICD-10-CM

## 2014-03-19 MED ORDER — DEXTROMETHORPHAN-GUAIFENESIN 20-200 MG/5ML PO LIQD: 20.0000 mg | Freq: Four times a day (QID) | ORAL | Status: DC | PRN

## 2014-03-19 NOTE — Progress Notes (Signed)
Pre visit review using our clinic review tool, if applicable. No additional management support is needed unless otherwise documented below in the visit note. 

## 2014-03-19 NOTE — Assessment & Plan Note (Signed)
Patient with likely viral illness status post course of a azithromycin from urgent care. She does continue to have dry cough. Have sent in guaifenesin cough syrup. She does have allergy to codeine. States she tried [employer] in the past with no good results.

## 2014-03-19 NOTE — Patient Instructions (Signed)
We did not think that you need any more antibiotics. We have sent in a medicine for cough that you can take every 6 hours. Take 5 mL by mouth up to every 6 hours for cough. Here cough may last up to the next 2 weeks until it goes away entirely. Night time and cold weather generally are worse and can cause coughing. We would recommend if you have a humidifier putting it in your room to help add moisture to the air and to your lungs.

## 2014-03-19 NOTE — Progress Notes (Signed)
   Subjective:    Patient ID: Brandi Villarreal, female    DOB: January 02, 1946, 68 y.o.   MRN: [id number]  HPI The patient is a 68 year old female who comes in today for acute visit for cough and cold. She's had these symptoms for about 4 weeks ongoing. She states that initially she had a cold and head congestion in her nose which resolved. Last one to 2 weeks she's had cough which started off with white sputum and progressed to thick green sputum. She was seen in urgent care and given prescription for azithromycin which she completed yesterday. Since then she's felt better and her sputum is now milder and white again. She is still having cough and that's why she came back today to be evaluated. Worse at nighttime and in cold weather.  Review of Systems  Constitutional: Negative for fever, chills, activity change, appetite change, fatigue and unexpected weight change.  HENT: Negative for congestion, ear discharge, ear pain, facial swelling, postnasal drip, rhinorrhea, sinus pressure, sore throat and trouble swallowing.   Respiratory: Positive for cough. Negative for shortness of breath and wheezing.   Cardiovascular: Negative for chest pain, palpitations and leg swelling.      Objective:   Physical Exam  Constitutional: She appears well-developed and well-nourished.  HENT:  Head: Normocephalic and atraumatic.  Right Ear: External ear normal.  Left Ear: External ear normal.  Oropharynx with mild erythema, nasal turbinates with mild erythema.  Eyes: EOM are normal.  Neck: Normal range of motion.  Cardiovascular: Normal rate and regular rhythm.   Pulmonary/Chest: Effort normal and breath sounds normal. No respiratory distress. She has no wheezes. She has no rales.  Abdominal: Soft.  Lymphadenopathy:    She has no cervical adenopathy.      Filed Vitals:   03/19/14 1107  BP: 110/78  Pulse: 61  Temp: 98 F (36.7 C)  TempSrc: Oral  Resp: 16  Height: 5\' 2"  (1.575 m)  Weight: 150 lb (68.04  kg)  SpO2: 97%   Assessment & Plan:

## 2014-06-13 ENCOUNTER — Telehealth: Payer: Self-pay

## 2014-06-16 NOTE — Telephone Encounter (Signed)
Patient outreach unsuccessful; Invalid phone number.

## 2014-07-08 DIAGNOSIS — M2428 Disorder of ligament, vertebrae: Secondary | ICD-10-CM | POA: Diagnosis not present

## 2014-07-08 DIAGNOSIS — M9902 Segmental and somatic dysfunction of thoracic region: Secondary | ICD-10-CM | POA: Diagnosis not present

## 2014-07-08 DIAGNOSIS — M9903 Segmental and somatic dysfunction of lumbar region: Secondary | ICD-10-CM | POA: Diagnosis not present

## 2014-07-08 DIAGNOSIS — M9901 Segmental and somatic dysfunction of cervical region: Secondary | ICD-10-CM | POA: Diagnosis not present

## 2014-07-08 DIAGNOSIS — M9904 Segmental and somatic dysfunction of sacral region: Secondary | ICD-10-CM | POA: Diagnosis not present

## 2014-07-08 DIAGNOSIS — M9907 Segmental and somatic dysfunction of upper extremity: Secondary | ICD-10-CM | POA: Diagnosis not present

## 2014-08-26 DIAGNOSIS — N958 Other specified menopausal and perimenopausal disorders: Secondary | ICD-10-CM | POA: Diagnosis not present

## 2014-08-26 DIAGNOSIS — Z01419 Encounter for gynecological examination (general) (routine) without abnormal findings: Secondary | ICD-10-CM | POA: Diagnosis not present

## 2014-08-26 DIAGNOSIS — Z13220 Encounter for screening for lipoid disorders: Secondary | ICD-10-CM | POA: Diagnosis not present

## 2014-08-26 DIAGNOSIS — Z1231 Encounter for screening mammogram for malignant neoplasm of breast: Secondary | ICD-10-CM | POA: Diagnosis not present

## 2014-08-26 DIAGNOSIS — M816 Localized osteoporosis [Lequesne]: Secondary | ICD-10-CM | POA: Diagnosis not present

## 2014-08-28 LAB — HM MAMMOGRAPHY

## 2014-09-09 DIAGNOSIS — I8311 Varicose veins of right lower extremity with inflammation: Secondary | ICD-10-CM | POA: Diagnosis not present

## 2014-09-09 DIAGNOSIS — I8312 Varicose veins of left lower extremity with inflammation: Secondary | ICD-10-CM | POA: Diagnosis not present

## 2014-09-09 DIAGNOSIS — M79604 Pain in right leg: Secondary | ICD-10-CM | POA: Diagnosis not present

## 2014-09-09 DIAGNOSIS — M79605 Pain in left leg: Secondary | ICD-10-CM | POA: Diagnosis not present

## 2014-09-18 ENCOUNTER — Telehealth: Payer: Self-pay | Admitting: *Deleted

## 2014-09-18 NOTE — Telephone Encounter (Signed)
I called pt to see when her last mammogram was. Last mammogram in [facility] is 11/27/2008. Home number is not in service. I left a vm on pt's mobile number to call me back to see if she has had a more recent mammogram. If not, will need to place order in [facility] for screening mammogram.

## 2014-10-03 ENCOUNTER — Encounter: Payer: Self-pay | Admitting: Gastroenterology

## 2014-10-03 ENCOUNTER — Encounter: Payer: Self-pay | Admitting: Internal Medicine

## 2014-10-03 NOTE — Telephone Encounter (Signed)
I spoke to pt- She states she went 08/2014 to [facility] and everything was normal on mammogram. Copy of report being faxed to be scanned.

## 2015-01-20 DIAGNOSIS — H40013 Open angle with borderline findings, low risk, bilateral: Secondary | ICD-10-CM | POA: Diagnosis not present

## 2015-01-20 DIAGNOSIS — L817 Pigmented purpuric dermatosis: Secondary | ICD-10-CM | POA: Diagnosis not present

## 2015-01-20 DIAGNOSIS — D1801 Hemangioma of skin and subcutaneous tissue: Secondary | ICD-10-CM | POA: Diagnosis not present

## 2015-01-20 DIAGNOSIS — L821 Other seborrheic keratosis: Secondary | ICD-10-CM | POA: Diagnosis not present

## 2015-01-20 DIAGNOSIS — L738 Other specified follicular disorders: Secondary | ICD-10-CM | POA: Diagnosis not present

## 2015-01-20 DIAGNOSIS — L814 Other melanin hyperpigmentation: Secondary | ICD-10-CM | POA: Diagnosis not present

## 2015-02-06 DIAGNOSIS — M9903 Segmental and somatic dysfunction of lumbar region: Secondary | ICD-10-CM | POA: Diagnosis not present

## 2015-02-06 DIAGNOSIS — M9906 Segmental and somatic dysfunction of lower extremity: Secondary | ICD-10-CM | POA: Diagnosis not present

## 2015-05-06 ENCOUNTER — Encounter: Payer: Self-pay | Admitting: Gastroenterology

## 2015-05-26 ENCOUNTER — Ambulatory Visit (INDEPENDENT_AMBULATORY_CARE_PROVIDER_SITE_OTHER): Payer: Medicare Other | Admitting: Adult Health

## 2015-05-26 ENCOUNTER — Encounter: Payer: Self-pay | Admitting: Adult Health

## 2015-05-26 VITALS — BP 128/80 | Temp 97.7°F | Ht 62.0 in | Wt 159.0 lb

## 2015-05-26 DIAGNOSIS — J069 Acute upper respiratory infection, unspecified: Secondary | ICD-10-CM | POA: Diagnosis not present

## 2015-05-26 NOTE — Progress Notes (Signed)
Pre visit review using our clinic review tool, if applicable. No additional management support is needed unless otherwise documented below in the visit note. 

## 2015-05-26 NOTE — Patient Instructions (Addendum)
It was great meeting you today.   Your exam is consistent with a viral upper respiratory infection.   Drink plenty of water and pick up Mucinex cough at the drug store.   If you are not feeling any better in the next 2-3 days please let me know.

## 2015-05-26 NOTE — Progress Notes (Signed)
   Subjective:    Patient ID: Brandi Villarreal, female    DOB: [birth year], 70 y.o.   MRN: [id number]  HPI  70 year old female who presents to the office today for chills, sinus pain and pressure, nasal discharge, and a productive cough ( started last night). Her symptoms started 7 days ago. Her sinus issues have been improving. She has a history of bronchitis and this is what her symptoms feel like  She denies any fevers, nausea, vomiting, diarrhea.   Her symptoms started 7 days ago   Review of Systems  Constitutional: Positive for fatigue. Negative for fever, diaphoresis and activity change.  HENT: Positive for congestion, postnasal drip, rhinorrhea and sinus pressure. Negative for ear discharge and ear pain.   Respiratory: Positive for cough and shortness of breath.   Gastrointestinal: Negative.   Musculoskeletal: Negative.        Objective:   Physical Exam  Constitutional: She is oriented to person, place, and time. She appears well-developed and well-nourished. No distress.  HENT:  Head: Normocephalic and atraumatic.  Right Ear: External ear normal.  Left Ear: External ear normal.  Nose: Nose normal.  Mouth/Throat: Oropharynx is clear and moist. No oropharyngeal exudate.  Eyes: Conjunctivae and EOM are normal. Pupils are equal, round, and reactive to light. Right eye exhibits no discharge. Left eye exhibits no discharge.  Neck: Normal range of motion. Neck supple.  Cardiovascular: Normal rate, regular rhythm, normal heart sounds and intact distal pulses.  Exam reveals no gallop and no friction rub.   No murmur heard. Pulmonary/Chest: Effort normal and breath sounds normal. No respiratory distress. She has no wheezes. She has no rales. She exhibits no tenderness.  Lymphadenopathy:    She has no cervical adenopathy.  Neurological: She is alert and oriented to person, place, and time.  Skin: Skin is warm and dry. No rash noted. She is not diaphoretic. No erythema. No pallor.    Psychiatric: She has a normal mood and affect. Her behavior is normal. Thought content normal.  Nursing note and vitals reviewed.     Assessment & Plan:  1. URI, acute - Likely viral, to early to tell if bronchitis.  - OTC Mucinex - Rest and hydration.  - She reports that she may have glaucoma, so we cannot use prednisone - Has an allergy to codeine  - Follow up if no improvement.

## 2015-08-26 ENCOUNTER — Ambulatory Visit: Payer: Medicare Other | Admitting: Adult Health

## 2015-10-20 ENCOUNTER — Ambulatory Visit: Payer: Medicare Other | Admitting: Adult Health

## 2015-11-30 NOTE — Progress Notes (Signed)
HPI:  Brandi Villarreal is here to establish care. Used to see Dr. Linna Darner. Last PCP and physical: Sees [name] - dx for lichen sclerosis  Has the following chronic problems that require follow up and concerns today:  Osteoporosis: -sees gyn for this -had bone density in 2016 -she has refused medication treatment -doing vit D3, reports adequate dietary calcium, no regular exercise currently  L shoulder Pain: -started after she fell on this about 1 month ago - slippped on water in kitchen -pain in lateral L shoulder -good ROM now -seeing chiroprator -no HEP/no xrays   She wants cholesterol and diabetes labs. Gyn did not do.  ROS negative for unless reported above: fevers, unintentional weight loss, hearing or vision loss, chest pain, palpitations, struggling to breath, hemoptysis, melena, hematochezia, hematuria, falls, loc, si, thoughts of self harm  Past Medical History:  Diagnosis Date  . Cholelithiasis   . Colitis ?1975  . Hemorrhoids   . Mitral valve prolapse   . Osteoporosis     Past Surgical History:  Procedure Laterality Date  . jaw resection    . RHINOPLASTY    . TONSILLECTOMY    . TUBAL LIGATION     bilteral    Family History  Problem Relation Age of Onset  . Colon cancer Brother 31  . Pancreatic cancer Brother     Social History   Social History  . Marital status: Married    Spouse name: N/A  . Number of children: N/A  . Years of education: N/A   Social History Main Topics  . Smoking status: Never Smoker  . Smokeless tobacco: Never Used  . Alcohol use No     Comment: wine-rare  . Drug use: No  . Sexual activity: Not Asked   Other Topics Concern  . 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  Lactobacillus (ULTIMATE PROBIOTIC  FORMULA PO), Take 1 capsule by mouth daily. Ultimate Flora Probiotic, Disp: , Rfl:  .  Multiple Vitamin (MULTIVITAMINS PO), Take 1 tablet by mouth daily.  , Disp: , Rfl:  .  naproxen sodium (ALEVE) 220 MG tablet, Take 220 mg by mouth every 8 (eight) hours as needed. For pain , Disp: , Rfl:  .  ValACYclovir HCl (VALTREX PO), Take by mouth as needed., Disp: , Rfl:  .  VENTOLIN HFA 108 (90 BASE) MCG/ACT inhaler, Inhale 2 puffs into the lungs every 4 (four) hours as needed. , Disp: , Rfl: 1  EXAM:  Vitals:   12/01/15 1018  BP: 100/72  Pulse: 79  Temp: 98 F (36.7 C)    Body mass index is 27.84 kg/m.  GENERAL: vitals reviewed and listed above, alert, oriented, appears well hydrated and in no acute distress  HEENT: atraumatic, conjunttiva clear, no obvious abnormalities on inspection of external nose and ears  NECK: no obvious masses on inspection  LUNGS: clear to auscultation bilaterally, no wheezes, rales or rhonchi, good air movement  CV: HRRR, no peripheral edema  MS: moves all extremities without noticeable abnormality Normal inspection of bother shoulders, upper back, arms ROM remarkable good TTP sig in Ant L humerus, + impingement test Strength somewhat less in all movement L shoulder - I think  due to pain NV intact distal  PSYCH: pleasant and cooperative, no obvious depression or anxiety  ASSESSMENT AND PLAN:  Discussed the following assessment and plan:  Osteoporosis -sees gyn - taking vit d -advised weight bearing exercise and treatment -she refuses medications for this  Calculus of gallbladder without cholecystitis without obstruction -she declined treatment, healthy diet advised  BMI 27.0-27.9,adult - Plan: Lipid Panel, Hemoglobin A1c, Lipid Panel, Hemoglobin A1c -lifestyle recs  Left shoulder pain - Plan: DG Shoulder Left -RTC contusion/tear vs other -plain films to exclude fx, if ok HEP and 1 month f/u -if fx will consult specialist  Encounter to  establish care -We reviewed the PMH, PSH, FH, SH, Meds and Allergies. -We provided refills for any medications we will prescribe as needed. -We addressed current concerns per orders and patient instructions. -We have asked for records for pertinent exams, studies, vaccines and notes from previous providers. -We have advised patient to follow up per instructions below. -CPE or Wellness exam with susan  Declined all vaccines today.  -Patient advised to return or notify a doctor immediately if symptoms worsen or persist or new concerns arise.  Patient Instructions  BEFORE YOU LEAVE: -follow up: Medicare Wellness with [name] in 3 months; follow up Dr. Maudie Mercury in 1 month if shoulder pain persists -xray sheet -rotator cuff exercises -labs Please go get the xray of the shoulder. If ok we will start the exercises 4 days per week.  We have ordered labs or studies at this visit. It can take up to 1-2 weeks for results and processing. IF results require follow up or explanation, we will call you with instructions. Clinically stable results will be released to your [facility]. If you have not heard from [place] or cannot find your results in [facility] in 2 weeks please contact our office at [phone].  If you are not yet signed up for [facility], please consider signing up.  We recommend the following healthy lifestyle: 1) Small portions - eat off of salad plate instead of dinner plate 2) Eat a healthy clean diet with avoidance of (less then 1 serving per week) processed foods, sweetened drinks, white starches, red meat, fast foods and sweets and consisting of: * 5-9 servings per day of fresh or frozen fruits and vegetables (not corn or potatoes, not dried or canned) *nuts and seeds, beans *olives and olive oil *small portions of lean meats such as fish and white chicken  *small portions of whole grains 3)Get at least 150 minutes of sweaty aerobic exercise per week 4)reduce stress - counseling, meditation,  relaxation to balance other aspects of your life      Lucretia Kern.

## 2015-12-01 ENCOUNTER — Encounter: Payer: Self-pay | Admitting: Family Medicine

## 2015-12-01 ENCOUNTER — Ambulatory Visit (INDEPENDENT_AMBULATORY_CARE_PROVIDER_SITE_OTHER)
Admission: RE | Admit: 2015-12-01 | Discharge: 2015-12-01 | Disposition: A | Discharge status: Home / Self Care | Payer: Medicare Other | Source: Ambulatory Visit | Attending: Family Medicine | Admitting: Family Medicine

## 2015-12-01 ENCOUNTER — Ambulatory Visit (INDEPENDENT_AMBULATORY_CARE_PROVIDER_SITE_OTHER): Payer: Medicare Other | Admitting: Family Medicine

## 2015-12-01 VITALS — BP 100/72 | HR 79 | Temp 98.0°F | Ht 62.0 in | Wt 152.2 lb

## 2015-12-01 DIAGNOSIS — Z7189 Other specified counseling: Secondary | ICD-10-CM

## 2015-12-01 DIAGNOSIS — Z7689 Persons encountering health services in other specified circumstances: Secondary | ICD-10-CM

## 2015-12-01 DIAGNOSIS — M810 Age-related osteoporosis without current pathological fracture: Secondary | ICD-10-CM | POA: Diagnosis not present

## 2015-12-01 DIAGNOSIS — Z6827 Body mass index (BMI) 27.0-27.9, adult: Secondary | ICD-10-CM | POA: Diagnosis not present

## 2015-12-01 DIAGNOSIS — M25512 Pain in left shoulder: Secondary | ICD-10-CM | POA: Diagnosis not present

## 2015-12-01 DIAGNOSIS — K8020 Calculus of gallbladder without cholecystitis without obstruction: Secondary | ICD-10-CM | POA: Diagnosis not present

## 2015-12-01 LAB — LIPID PANEL
CHOL/HDL RATIO: 4
Cholesterol: 156 mg/dL (ref 0–200)
HDL: 44.3 mg/dL (ref >39.00)
LDL Cholesterol: 92 mg/dL (ref 0–99)
NONHDL: 111.3
Triglycerides: 97 mg/dL (ref 0.0–149.0)
VLDL: 19.4 mg/dL (ref 0.0–40.0)

## 2015-12-01 LAB — HEMOGLOBIN A1C: HEMOGLOBIN A1C: 5.6 % (ref 4.6–6.5)

## 2015-12-01 IMAGING — DX DG SHOULDER 2+V*L*
3 series · 3 of 3 positions shown · non-contrast
Comparison: None.

CLINICAL DATA: Fell in [REDACTED], pain in the left shoulder with limited
range of motion since

EXAM:
LEFT SHOULDER - 2+ VIEW

[grashey]
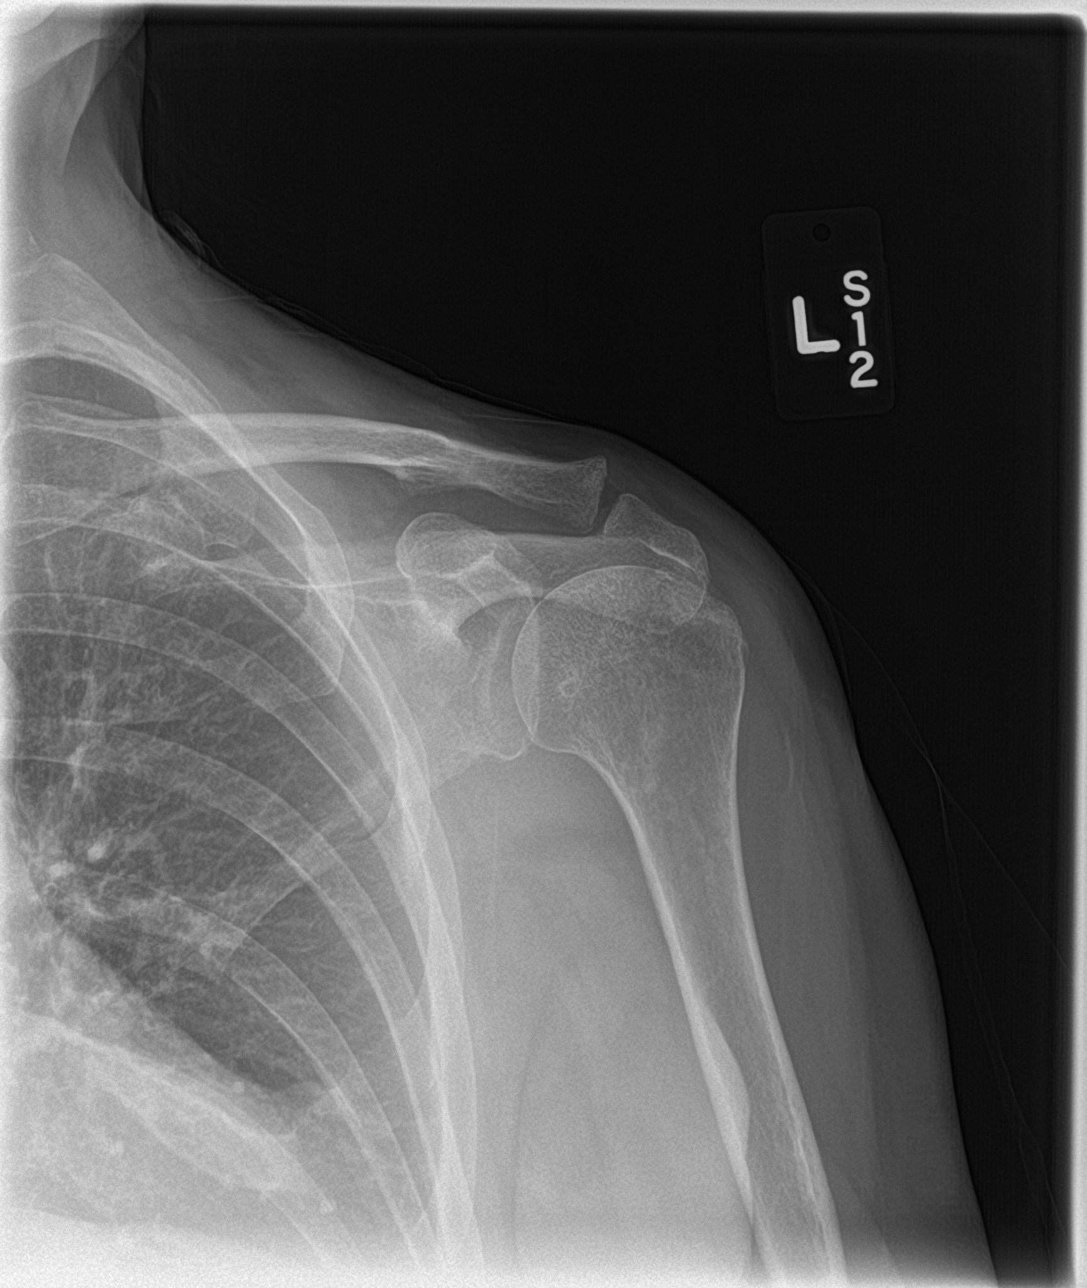

[y view]
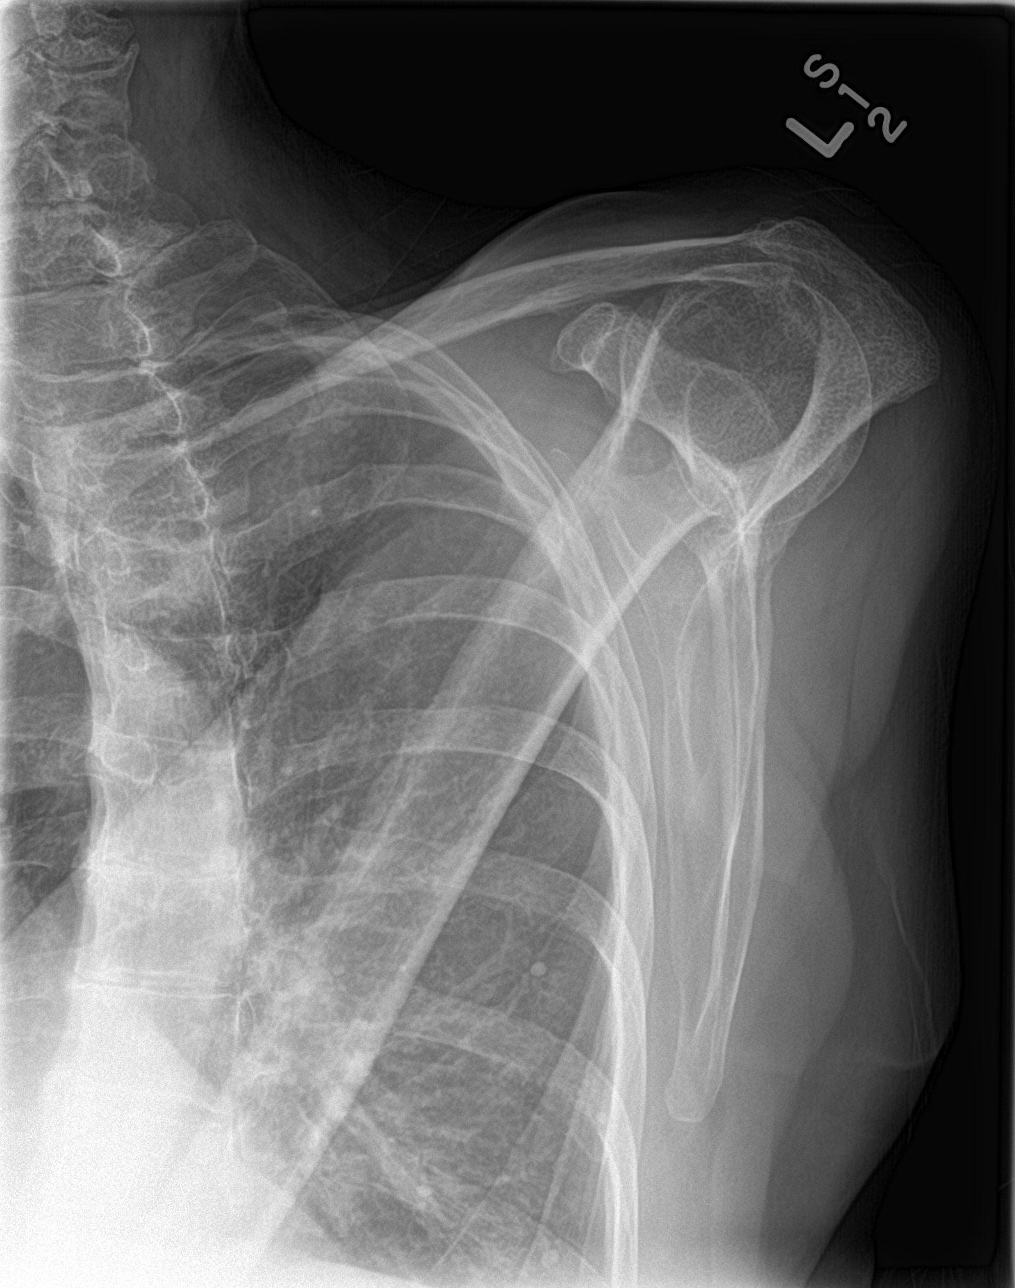

[shoulder axial]
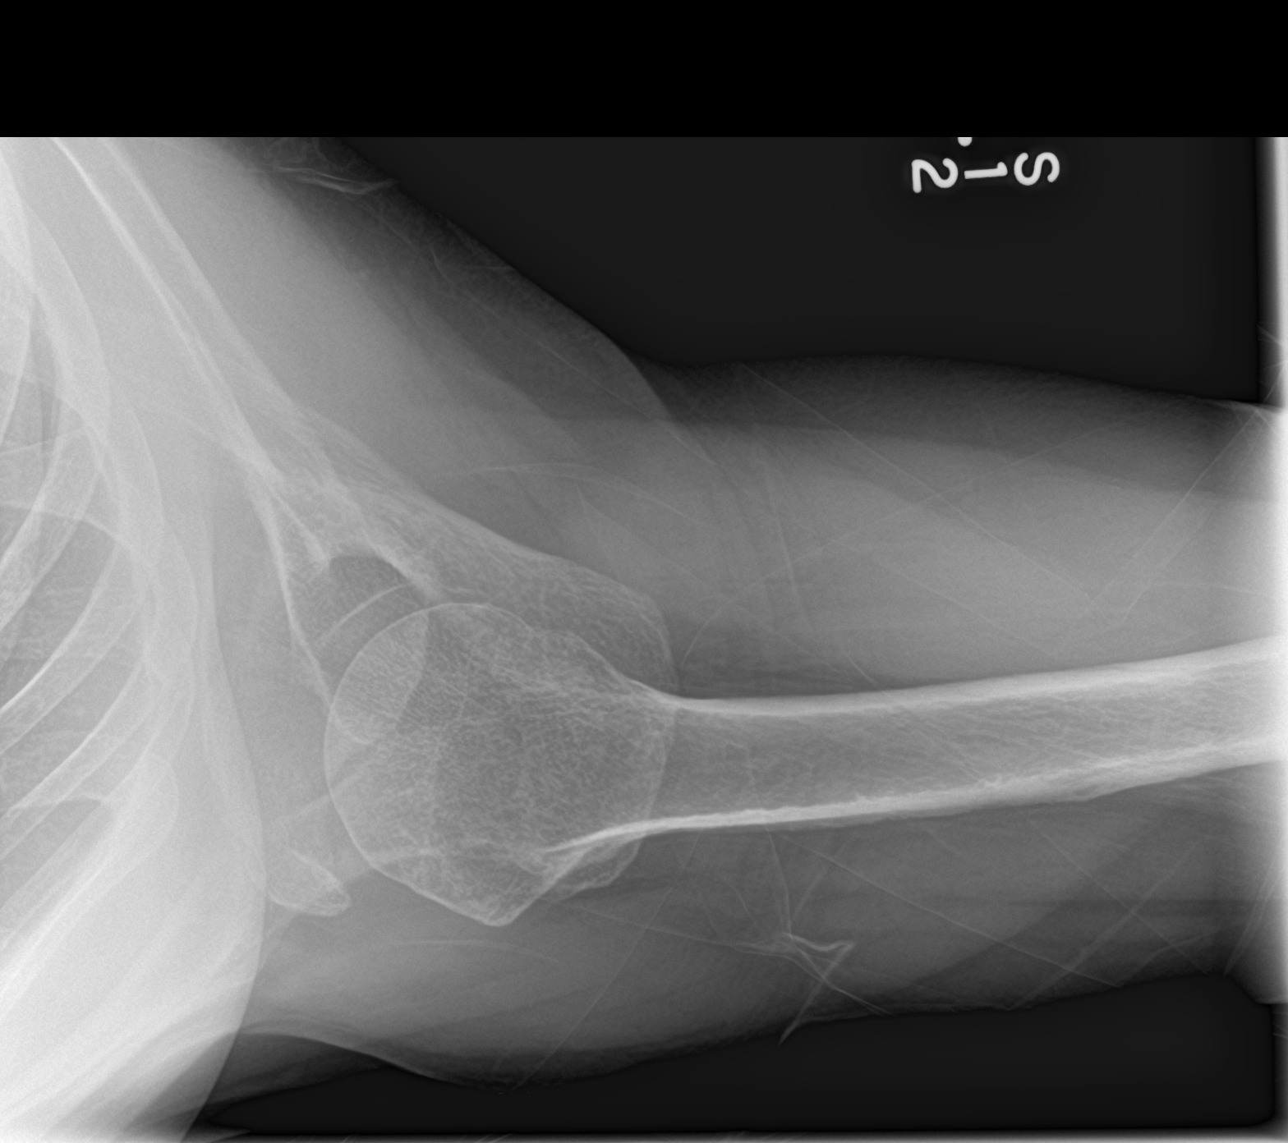

[3 of 3 positions shown; findings below may reference images not displayed]

FINDINGS: The left humeral head is in normal position. No acute fracture is
seen. There is however a downward sloping acromion with narrowed
subacromial joint space consistent with impingement and possibly
chronic rotator cuff disease. The left AC joint is normally aligned.
IMPRESSION: 1. Suspect impingement and possibly chronic rotator cuff disease.
2. No acute fracture.

## 2015-12-01 NOTE — Addendum Note (Signed)
Addended by: Denna Haggard K on: 12/01/2015 11:08 AM   Modules accepted: Orders

## 2015-12-01 NOTE — Progress Notes (Signed)
Pre visit review using our clinic review tool, if applicable. No additional management support is needed unless otherwise documented below in the visit note. 

## 2015-12-01 NOTE — Patient Instructions (Addendum)
BEFORE YOU LEAVE: -follow up: Medicare Wellness with [name] in 3 months; follow up Dr. Maudie Mercury in 1 month if shoulder pain persists -xray sheet -rotator cuff exercises -labs Please go get the xray of the shoulder. If ok we will start the exercises 4 days per week.  We have ordered labs or studies at this visit. It can take up to 1-2 weeks for results and processing. IF results require follow up or explanation, we will call you with instructions. Clinically stable results will be released to your [facility]. If you have not heard from [place] or cannot find your results in [facility] in 2 weeks please contact our office at [phone].  If you are not yet signed up for [facility], please consider signing up.  We recommend the following healthy lifestyle: 1) Small portions - eat off of salad plate instead of dinner plate 2) Eat a healthy clean diet with avoidance of (less then 1 serving per week) processed foods, sweetened drinks, white starches, red meat, fast foods and sweets and consisting of: * 5-9 servings per day of fresh or frozen fruits and vegetables (not corn or potatoes, not dried or canned) *nuts and seeds, beans *olives and olive oil *small portions of lean meats such as fish and white chicken  *small portions of whole grains 3)Get at least 150 minutes of sweaty aerobic exercise per week 4)reduce stress - counseling, meditation, relaxation to balance other aspects of your life

## 2015-12-03 ENCOUNTER — Telehealth: Payer: Self-pay | Admitting: Family Medicine

## 2015-12-03 NOTE — Telephone Encounter (Signed)
°  Pt would like a call back to discuss her next steps concerning her xray

## 2015-12-03 NOTE — Telephone Encounter (Signed)
See results note. 

## 2016-02-18 ENCOUNTER — Encounter: Payer: Self-pay | Admitting: Family Medicine

## 2016-02-18 ENCOUNTER — Ambulatory Visit (INDEPENDENT_AMBULATORY_CARE_PROVIDER_SITE_OTHER): Payer: Medicare Other | Admitting: Family Medicine

## 2016-02-18 VITALS — BP 100/60 | HR 68 | Temp 97.8°F | Ht 62.0 in | Wt 154.6 lb

## 2016-02-18 DIAGNOSIS — R229 Localized swelling, mass and lump, unspecified: Secondary | ICD-10-CM | POA: Diagnosis not present

## 2016-02-18 NOTE — Progress Notes (Signed)
Pre visit review using our clinic review tool, if applicable. No additional management support is needed unless otherwise documented below in the visit note. 

## 2016-02-18 NOTE — Patient Instructions (Signed)
Please call your dermatologist for follow up in the next 1-2 months about the steroid issue and the small lump on the R thigh.

## 2016-02-18 NOTE — Progress Notes (Signed)
HPI:  Brandi Villarreal is a pleasant 42 here for an acute visit for a small lump on her R anterior thigh. She noticed it a few weeks ago. She is she her gynecologist and dermatologist for lichen sclerosis and is frustrated with this dx as can not tolerate steroids, which were recommended. This lump is not painful. No other lumps. No fevers, malaise, drainage or known injury or trauma to this area.  ROS: See pertinent positives and negatives per HPI.  Past Medical History:  Diagnosis Date  . Cholelithiasis    rare symptoms, declined surgery  . Colitis ?1975  . Hemorrhoids   . Lichen sclerosus    sees gyn for management  . Mitral valve prolapse   . Osteoporosis    sees gyn; refused medication  . Varicose veins     Past Surgical History:  Procedure Laterality Date  . jaw resection    . RHINOPLASTY    . TONSILLECTOMY    . TUBAL LIGATION     bilteral    Family History  Problem Relation Age of Onset  . Colon cancer Brother 73  . Pancreatic cancer Brother     Social History   Social History  . Marital status: Married    Spouse name: N/A  . Number of children: N/A  . Years of education: N/A   Social History Main Topics  . Smoking status: Never Smoker  . Smokeless tobacco: Never Used  . Alcohol use No     Comment: wine-rare  . Drug use: No  . Sexual activity: Not Asked   Other Topics Concern  . 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  CALCIUM PO, Take by mouth., Disp: , Rfl:  .  Lactobacillus (ULTIMATE PROBIOTIC FORMULA PO), Take 1 capsule by mouth daily. Ultimate Flora Probiotic, Disp: , Rfl:  .  Multiple Vitamin (MULTIVITAMINS PO), Take 1 tablet by mouth daily.  , Disp: , Rfl:  .  VENTOLIN HFA 108 (90 BASE) MCG/ACT inhaler, Inhale 2 puffs into the lungs  every 4 (four) hours as needed. , Disp: , Rfl: 1  EXAM:  Vitals:   02/18/16 1140  BP: 100/60  Pulse: 68  Temp: 97.8 F (36.6 C)    Body mass index is 28.28 kg/m.  GENERAL: vitals reviewed and listed above, alert, oriented, appears well hydrated and in no acute distress  HEENT: atraumatic, conjunttiva clear, no obvious abnormalities on inspection of external nose and ears  NECK: no obvious masses on inspection  LUNGS: clear to auscultation bilaterally, no wheezes, rales or rhonchi, good air movement  CV: HRRR, no peripheral edema  SKIN: very small, ~ 3-4 mm in diameter subcut nodule R ant thigh with ? Small pore overlying. Nontender.  MS: moves all extremities without noticeable abnormality  PSYCH: pleasant and cooperative, no obvious depression or anxiety  ASSESSMENT AND PLAN:  Discussed the following assessment and plan:  Subcutaneous nodule  -we discussed possible serious and likely etiologies, workup and treatment, treatment risks and return precautions - she wants definitive dx, best achieved by biopsy and given small and superficial her dermatologist may be able to do this -after this discussion, [name] opted for eval with her dermatologist for removal for  more definitive dx -follow up advised as needed -of course, we advised [name]  to return or notify a doctor immediately if symptoms worsen or persist or new concerns arise.   Patient Instructions  Please call your dermatologist for follow up in the next 1-2 months about the steroid issue and the small lump on the R thigh.    Colin Benton R., DO

## 2016-06-10 LAB — FECAL OCCULT BLOOD, GUAIAC: Fecal Occult Blood: NEGATIVE

## 2016-06-28 ENCOUNTER — Encounter: Payer: Self-pay | Admitting: Family Medicine

## 2016-07-14 ENCOUNTER — Ambulatory Visit (INDEPENDENT_AMBULATORY_CARE_PROVIDER_SITE_OTHER)
Admission: RE | Admit: 2016-07-14 | Discharge: 2016-07-14 | Disposition: A | Discharge status: Home / Self Care | Payer: Medicare Other | Source: Ambulatory Visit | Attending: Family Medicine | Admitting: Family Medicine

## 2016-07-14 ENCOUNTER — Encounter: Payer: Self-pay | Admitting: Family Medicine

## 2016-07-14 ENCOUNTER — Ambulatory Visit (INDEPENDENT_AMBULATORY_CARE_PROVIDER_SITE_OTHER): Payer: Medicare Other | Admitting: Family Medicine

## 2016-07-14 VITALS — BP 100/70 | HR 100 | Temp 98.8°F | Ht 62.0 in

## 2016-07-14 DIAGNOSIS — J111 Influenza due to unidentified influenza virus with other respiratory manifestations: Secondary | ICD-10-CM

## 2016-07-14 DIAGNOSIS — R059 Cough, unspecified: Secondary | ICD-10-CM

## 2016-07-14 DIAGNOSIS — R0602 Shortness of breath: Secondary | ICD-10-CM | POA: Diagnosis not present

## 2016-07-14 DIAGNOSIS — J989 Respiratory disorder, unspecified: Secondary | ICD-10-CM

## 2016-07-14 DIAGNOSIS — R05 Cough: Secondary | ICD-10-CM

## 2016-07-14 IMAGING — DX DG CHEST 2V
2 series · 2 of 2 positions shown · non-contrast
Comparison: [DATE]

CLINICAL DATA: Cough, congestion, shortness of Breath

EXAM:
CHEST  2 VIEW

[chest pa]
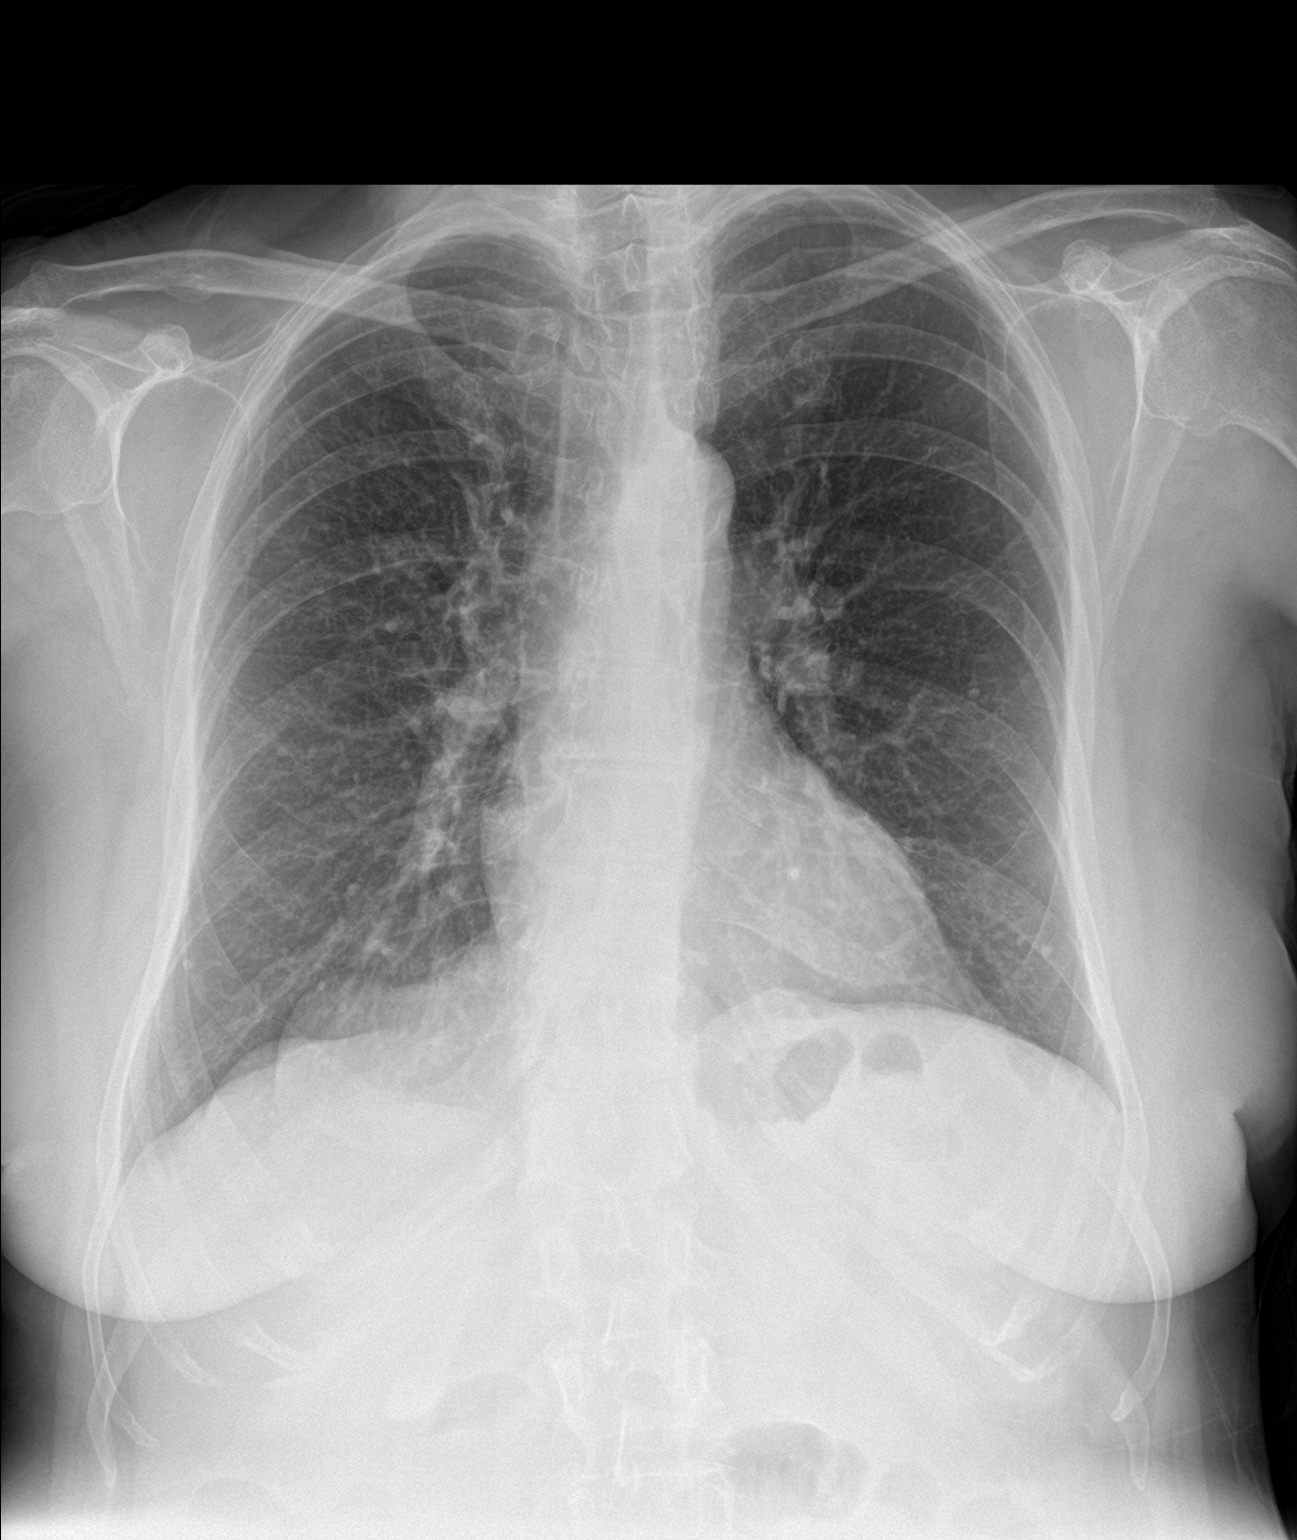

[chest lat]
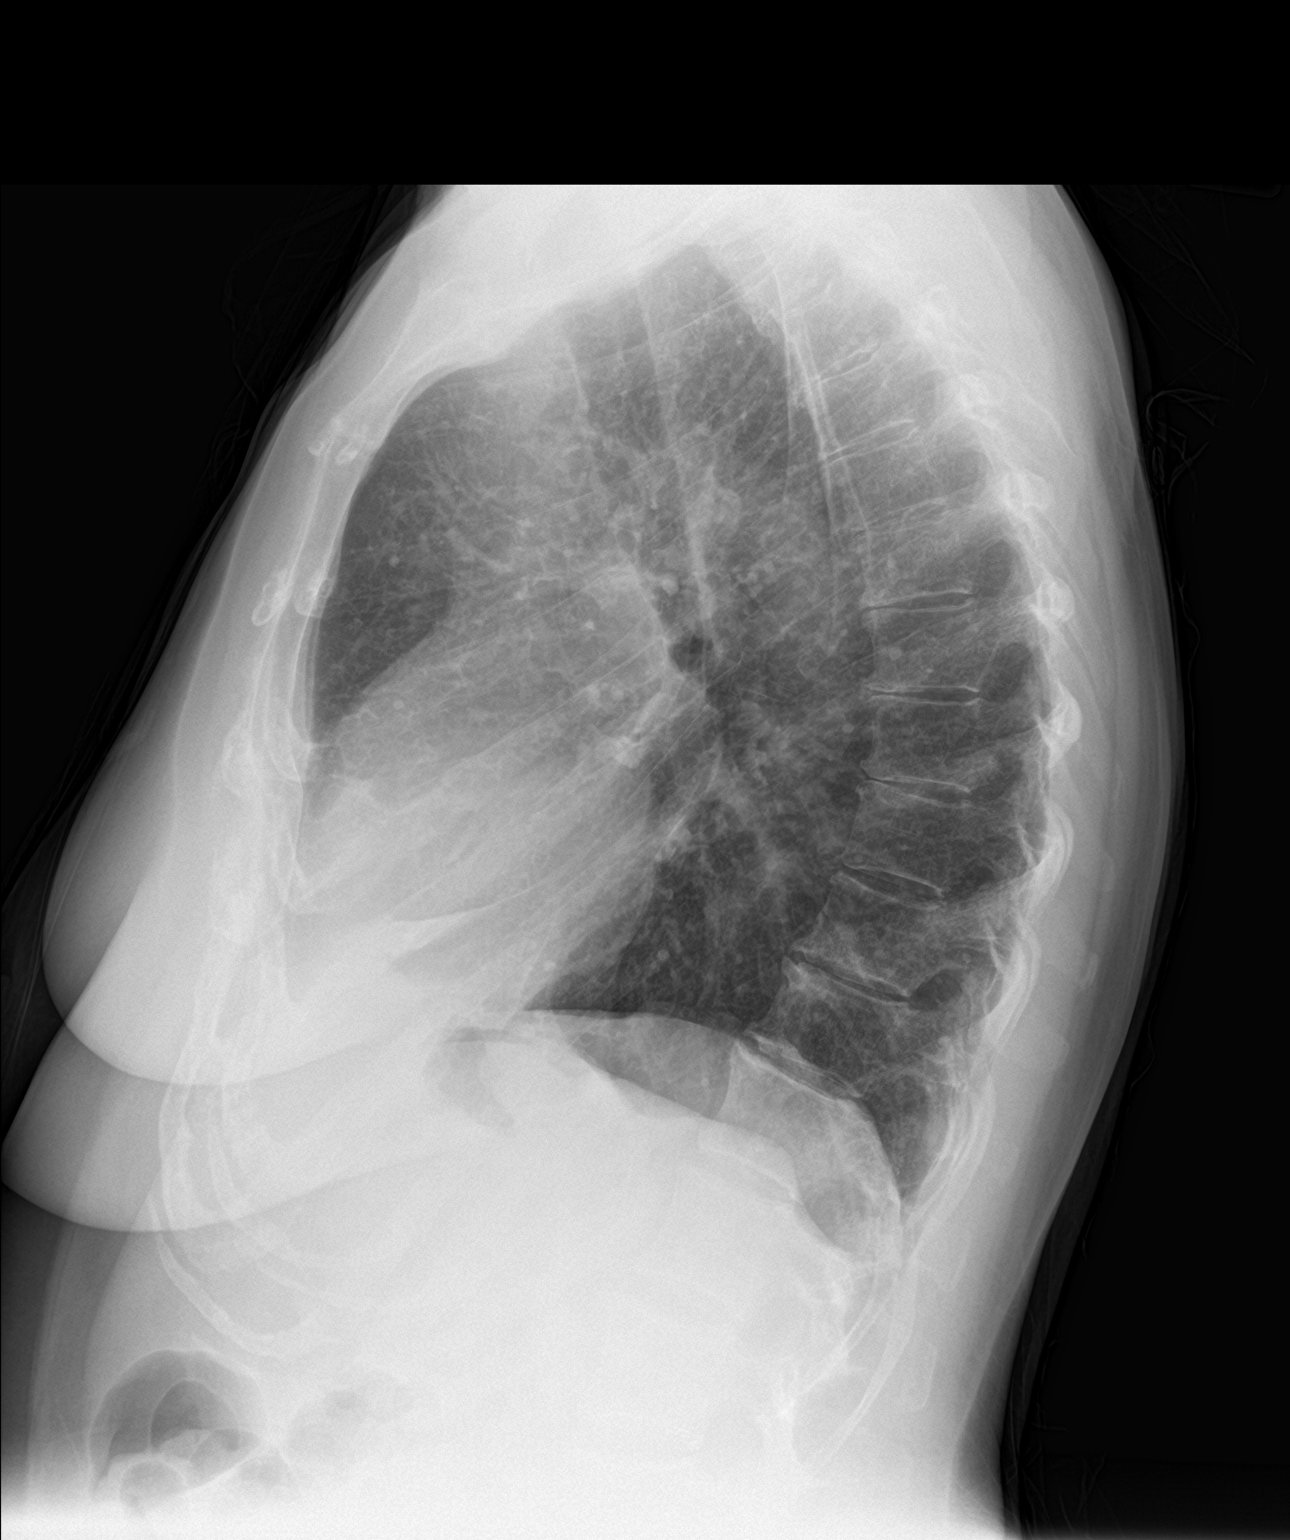

[2 of 2 positions shown; findings below may reference images not displayed]

FINDINGS: Small calcified granulomas in the lungs bilaterally. Heart is normal
size. No confluent opacities or effusions. No acute bony
abnormality.
IMPRESSION: Stable small calcified nodules compatible with old granulomatous
disease.

No active disease.

## 2016-07-14 MED ORDER — ALBUTEROL SULFATE HFA 108 (90 BASE) MCG/ACT IN AERS: 2.0000 | INHALATION_SPRAY | RESPIRATORY_TRACT | 1 refills | Status: DC | PRN

## 2016-07-14 MED ORDER — DOXYCYCLINE HYCLATE 100 MG PO TABS: 100.0000 mg | ORAL_TABLET | Freq: Two times a day (BID) | ORAL | 0 refills | Status: DC

## 2016-07-14 NOTE — Patient Instructions (Signed)
BEFORE YOU LEAVE: -follow up: annual exam with Dr. Maudie Mercury in 1 week -xray sheet  Go get the xray  Take the antibiotic (doxycycline) as prescribed .  Use the inhaler - albuterol as needed per instructions.  I hope you are feeling better soon! Seek care immediately if worsening, new concerns or you are not improving with treatment.

## 2016-07-14 NOTE — Progress Notes (Signed)
Pre visit review using our clinic review tool, if applicable. No additional management support is needed unless otherwise documented below in the visit note. Patient declined weight measurement today.

## 2016-07-14 NOTE — Progress Notes (Signed)
HPI:  Acute visit for cough and illness: -started: 1.5 weeks ago after sick grandson coughed on her -symptoms:nasal congestion, sore throat, cough productive of thick sputum, chills, sub fever, wheezing occ, body aches, nausea and vomiting (only for the first day) -denies: diarrhea, rash, SOB, hemoptysis -has tried: nothing -sick contacts/travel/risks: husband and grandson with similar milder symptoms -Hx of: allergies; ? Bronchitis - prior PCP gave her albuterol when sick, reports alb is expired, reports does not tolerate steroids or prednisone - makes her itchy ROS: See pertinent positives and negatives per HPI.  Past Medical History:  Diagnosis Date  . Cholelithiasis    rare symptoms, declined surgery  . Colitis ?1975  . Hemorrhoids   . Lichen sclerosus    sees gyn for management  . Mitral valve prolapse   . Osteoporosis    sees gyn; refused medication  . Varicose veins     Past Surgical History:  Procedure Laterality Date  . jaw resection    . RHINOPLASTY    . TONSILLECTOMY    . TUBAL LIGATION     bilteral    Family History  Problem Relation Age of Onset  . Colon cancer Brother 45  . Pancreatic cancer Brother     Social History   Social History  . Marital status: Married    Spouse name: N/A  . Number of children: N/A  . Years of education: N/A   Social History Main Topics  . Smoking status: Never Smoker  . Smokeless tobacco: Never Used  . Alcohol use No     Comment: wine-rare  . Drug use: No  . Sexual activity: Not Asked   Other Topics Concern  . 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  albuterol (VENTOLIN HFA) 108 (90 Base) MCG/ACT inhaler, Inhale 2 puffs into the lungs every 4 (four) hours as needed., Disp: 1 Inhaler, Rfl: 1 .   CALCIUM PO, Take by mouth., Disp: , Rfl:  .  Lactobacillus (ULTIMATE PROBIOTIC FORMULA PO), Take 1 capsule by mouth daily. Ultimate Flora Probiotic, Disp: , Rfl:  .  Multiple Vitamin (MULTIVITAMINS PO), Take 1 tablet by mouth daily.  , Disp: , Rfl:  .  doxycycline (VIBRA-TABS) 100 MG tablet, Take 1 tablet (100 mg total) by mouth 2 (two) times daily., Disp: 14 tablet, Rfl: 0  EXAM:  Vitals:   07/14/16 1458  BP: 100/70  Pulse: 100  Temp: 98.8 F (37.1 C)    There is no height or weight on file to calculate BMI.  GENERAL: vitals reviewed and listed above, alert, oriented, appears well hydrated and in no acute distress  HEENT: atraumatic, conjunttiva clear, no obvious abnormalities on inspection of external nose and ears, normal appearance of ear canals and TMs, clear nasal congestion, mild post oropharyngeal erythema with PND, no tonsillar edema or exudate, no sinus TTP  NECK: no obvious masses on inspection  LUNGS: scattered wheeze, no signs resp distress  CV: HRRR, no peripheral edema  MS: moves all extremities without noticeable abnormality  PSYCH: pleasant and cooperative, no obvious depression or anxiety  ASSESSMENT AND PLAN:  Discussed the following assessment and plan:  Influenza - Plan: DG Chest 2 View  Cough - Plan: DG  Chest 2 View  Respiratory illness - Plan: DG Chest 2 View   We discussed potential etiologies with influenza vs resp infection most likely w/ possible bronchitis or CAP. We discussed treatment side effects, likely course, meds and risks, evaluation, transmission, potential complications, return precations and signs of developing a worsening or serious illness. Advised CXR and she seems reluctant to do - ordered and she will consider. Start on doxy and alb refilled. She does not wish to take steroids.  -of course, we advised to return or notify a doctor immediately if symptoms worsen or persist or new concerns arise. Follow up in 1 week for Medicare  exam and follow up.    Patient Instructions  BEFORE YOU LEAVE: -follow up: annual exam with Dr. Maudie Mercury in 1 week -xray sheet  Go get the xray  Take the antibiotic (doxycycline) as prescribed .  Use the inhaler - albuterol as needed per instructions.  I hope you are feeling better soon! Seek care immediately if worsening, new concerns or you are not improving with treatment.        Colin Benton R., DO

## 2016-07-15 ENCOUNTER — Telehealth: Payer: Self-pay | Admitting: Family Medicine

## 2016-07-15 NOTE — Telephone Encounter (Signed)
See result note.  

## 2016-07-15 NOTE — Telephone Encounter (Signed)
Pt calling to check the status of the xray results pt is aware that they will call when they have been received.

## 2016-07-25 NOTE — Progress Notes (Signed)
Medicare Annual Preventive Care Visit  (initial annual wellness or annual wellness exam)  Concerns and/or follow up today: Recently treated with doxy for possible CAP following influenzal like illness. She then did a CXR which did not show pneumonia. Reports she reacted to the doxy - caused hives. then has reported allergic reaction to shrimp last week and had nausea and vomiting several times - wants to check electrolytes. No swelling, SOB, difficulty breathing or hives with that episode. Reports doing great this week. Wants to check TFT as report remote hx thyroid disease on medications as a child. Wants to repeat lipid and diabetes screening as has struggled with keeping weight down.Sees gyn Arbie Cookey Curis) for osteoporosis - refused medications for this.  Had lipid and diabetes screening in 11/2015. Fecal occult blood test neg 06/2016. Reports is utd on breast/mammo/pelvic exams - sees gyn for this. Reports does not want to do DEXA again as is not interested in medications. Agrees to tetanus booster and hep c screen. Declines pneumonia vaccine. See HM section in Epic for other details of completed HM. See scanned documentation under Media Tab for further documentation HPI, health risk assessment. See Media Tab and Care Teams sections in Epic for other providers.  ROS: negative for report of fevers, unintentional weight loss, vision changes, vision loss, hearing loss or change, chest pain, sob, hemoptysis, melena, hematochezia, hematuria, genital discharge or lesions, falls, bleeding or bruising, loc, thoughts of suicide or self harm, memory loss  1.) Patient-completed health risk assessment  - completed and reviewed, see scanned documentation  2.) Review of Medical History: -PMH, PSH, Family History and current specialty and care providers reviewed and updated and listed below  - see scanned in document in chart and below  Past Medical History:  Diagnosis Date  . Cholelithiasis    rare symptoms,  declined surgery  . Colitis ?1975  . Hemorrhoids   . Lichen sclerosus    sees gyn for management  . Mitral valve prolapse   . Osteoporosis    sees gyn; refused medication  . Varicose veins     Past Surgical History:  Procedure Laterality Date  . jaw resection    . RHINOPLASTY    . TONSILLECTOMY    . TUBAL LIGATION     bilteral    Social History   Social History  . Marital status: Married    Spouse name: N/A  . Number of children: N/A  . Years of education: N/A   Occupational History  . Not on file.   Social History Main Topics  . Smoking status: Never Smoker  . Smokeless tobacco: Never Used  . Alcohol use No     Comment: wine-rare  . Drug use: No  . Sexual activity: Not on file   Other Topics Concern  . Not on file   Social History Narrative   Work or School: retired - Merchandiser, retail      Home Situation: lives with husband      Spiritual Beliefs: Christian      Lifestyle: no regular exercise; diet is good       Family History  Problem Relation Age of Onset  . Colon cancer Brother 64  . Pancreatic cancer Brother     Current Outpatient Prescriptions on File Prior to Visit  Medication Sig Dispense Refill  . albuterol (VENTOLIN HFA) 108 (90 Base) MCG/ACT inhaler Inhale 2 puffs into the lungs every 4 (four) hours as needed. 1 Inhaler 1  . CALCIUM PO Take by  mouth.    . Lactobacillus (ULTIMATE PROBIOTIC FORMULA PO) Take 1 capsule by mouth daily. Ultimate Flora Probiotic    . Multiple Vitamin (MULTIVITAMINS PO) Take 1 tablet by mouth daily.       No current facility-administered medications on file prior to visit.      3.) Review of functional ability and level of safety:  Any difficulty hearing?  See scanned documentation  History of falling?  See scanned documentation  Any trouble with IADLs - using a phone, using transportation, grocery shopping, preparing meals, doing housework, doing laundry, taking medications and managing money?  See  scanned documentation  Advance Directives? Reports has HCPOA and living will and agrees to bring copy. See summary of recommendations in Patient Instructions below.  4.) Physical Exam Vitals:   07/26/16 0942  BP: 102/80  Pulse: 75  Temp: 97.9 F (36.6 C)   Estimated body mass index is 27.82 kg/m as calculated from the following:   Height as of this encounter: '5\' 2"'  (1.575 m).   Weight as of this encounter: 152 lb 1.6 oz (69 kg).  EKG (optional): deferred  General: alert, appear well hydrated and in no acute distress, normal appearance of ear canals and TMs, clear nasal congestion, mild post oropharyngeal erythema with PND, no tonsillar edema or exudate, no sinus TTP  NECK: no masses appreciated  HEENT: visual acuity grossly intact  CV: HRRR, no peripheral edema  Lungs: CTA bilaterally  ABD: soft, NTTP  Psych: pleasant and cooperative, no obvious depression or anxiety  Cognitive function grossly intact  See patient instructions for recommendations.  Education and counseling regarding the above review of health provided with a plan for the following: -see scanned patient completed form for further details -fall prevention strategies discussed  -healthy lifestyle discussed -importance and resources for completing advanced directives discussed -see patient instructions below for any other recommendations provided  4)The following written screening schedule of preventive measures were reviewed with assessment and plan made per below, orders and patient instructions:      AAA screening done if applicable     Alcohol screening done     Obesity Screening and counseling done     STI screening (Hep C if born [birth year]) offered and per pt wishes     Tobacco Screening done       Pneumococcal (PPSV23 -one dose after 64, one before if risk factors), influenza yearly and hepatitis B vaccines (if high risk - end stage renal disease, IV drugs, homosexual men, live in home for  mentally retarded, hemophilia receiving factors) ASSESSMENT/PLAN:declined      Screening mammograph (yearly if >40) ASSESSMENT/PLAN: utd or ordered      Screening Pap smear/pelvic exam (q2 years) ASSESSMENT/PLAN: n/a, declined - sees gyn      Colorectal cancer screening (FOBT yearly or flex sig q4y or colonoscopy q10y or barium enema q4y) ASSESSMENT/PLAN: utd, normal colonoscopy in 2012      Diabetes outpatient self-management training services ASSESSMENT/PLAN: n/a      Bone mass measurements(covered q2y if indicated - estrogen def, osteoporosis, hyperparathyroid, vertebral abnormalities, osteoporosis or steroids) ASSESSMENT/PLAN: declines further, done with gyn      Screening for glaucoma(q1y if high risk - diabetes, FH, AA and > 50 or hispanic and > 65) ASSESSMENT/PLAN: utd - see scanned documentation in epic      Medical nutritional therapy for individuals with diabetes or renal disease ASSESSMENT/PLAN: n/a      Cardiovascular screening blood tests (lipids q5y) ASSESSMENT/PLAN: see orders and  labs      Diabetes screening tests ASSESSMENT/PLAN: see orders and labs   7.) Summary:   Medicare annual wellness visit, subsequent -risk factors and conditions per above assessment were discussed and treatment, recommendations and referrals were offered per documentation above and orders and patient instructions.  Nausea and vomiting, intractability of vomiting not specified, unspecified vomiting type - Plan: Basic metabolic panel, resolved currently, advised to see allergiest  Hx of thyroid disease - Plan: TSH  cholesterol screening - Plan: Lipid panel  Hyperglycemia - Plan: Hemoglobin A1c  Other problems related to lifestyle - Plan: Hepatitis C antibody  Patient Instructions   BEFORE YOU LEAVE: -follow up: Medicare exam with [name] and follow up with Dr. Maudie Mercury - same day in 1 year -tetanus booster -labs  We have ordered labs or studies at this visit. It can take up to 1-2  weeks for results and processing. IF results require follow up or explanation, we will call you with instructions. Clinically stable results will be released to your [facility]. If you have not heard from [place] or cannot find your results in [facility] in 2 weeks please contact our office at [phone].  If you are not yet signed up for [facility], please consider signing up.  Please bring a copy of you advanced directives to our office.   Brandi Villarreal , Thank you for taking time to come for your Medicare Wellness Visit. I appreciate your ongoing commitment to your health goals. Please review the following plan we discussed and let me know if I can assist you in the future.   These are the goals we discussed: Goals    See below      This is a list of the screening recommended for you and due dates:  Health Maintenance  Topic Date Due  . Flu Shot  Declined  . Tetanus Vaccine  Today  .  Hepatitis C: One time screening is recommended by Center for Disease Control  (CDC) for  adults born from [birth year] through 1965.   Today  . Pneumonia vaccines (1 of 2 - PCV13) Declined today  . Mammogram  08/27/2016  . Stool Blood Test  06/10/2017  . DEXA scan (bone density measurement)  Completed  *Topic was postponed. The date shown is not the original due date.     Preventive Care 48 Years and Older, Female Preventive care refers to lifestyle choices and visits with your health care provider that can promote health and wellness. What does preventive care include?  A yearly physical exam. This is also called an annual well check.  Dental exams once or twice a year.  Routine eye exams. Ask your health care provider how often you should have your eyes checked.  Personal lifestyle choices, including:  Daily care of your teeth and gums.  Regular physical activity.  Eating a healthy diet.  Avoiding tobacco and drug use.  Limiting alcohol use.  Practicing safe sex.  Taking low-dose aspirin every  day.  Taking vitamin and mineral supplements as recommended by your health care provider. What happens during an annual well check? The services and screenings done by your health care provider during your annual well check will depend on your age, overall health, lifestyle risk factors, and family history of disease. Counseling  Your health care provider may ask you questions about your:  Alcohol use.  Tobacco use.  Drug use.  Emotional well-being.  Home and relationship well-being.  Sexual activity.  Eating habits.  History of falls.  Memory and ability to understand (cognition).  Work and work Statistician.  Reproductive health. Screening  You may have the following tests or measurements:  Height, weight, and BMI.  Blood pressure.  Lipid and cholesterol levels. These may be checked every 5 years, or more frequently if you are over 9 years old.  Skin check.  Lung cancer screening. You may have this screening every year starting at age 34 if you have a 30-pack-year history of smoking and currently smoke or have quit within the past 15 years.  Fecal occult blood test (FOBT) of the stool. You may have this test every year starting at age 68.  Flexible sigmoidoscopy or colonoscopy. You may have a sigmoidoscopy every 5 years or a colonoscopy every 10 years starting at age 48.  Hepatitis C blood test.  Hepatitis B blood test.  Sexually transmitted disease (STD) testing.  Diabetes screening. This is done by checking your blood sugar (glucose) after you have not eaten for a while (fasting). You may have this done every 1-3 years.  Bone density scan. This is done to screen for osteoporosis. You may have this done starting at age 71.  Mammogram. This may be done every 1-2 years. Talk to your health care provider about how often you should have regular mammograms. Talk with your health care provider about your test results, treatment options, and if necessary, the need  for more tests. Vaccines  Your health care provider may recommend certain vaccines, such as:  Influenza vaccine. This is recommended every year.  Tetanus, diphtheria, and acellular pertussis (Tdap, Td) vaccine. You may need a Td booster every 10 years.  Varicella vaccine. You may need this if you have not been vaccinated.  Zoster vaccine. You may need this after age 33.  Measles, mumps, and rubella (MMR) vaccine. You may need at least one dose of MMR if you were born in 1957 or later. You may also need a second dose.  Pneumococcal 13-valent conjugate (PCV13) vaccine. One dose is recommended after age 81.  Pneumococcal polysaccharide (PPSV23) vaccine. One dose is recommended after age 65.  Meningococcal vaccine. You may need this if you have certain conditions.  Hepatitis A vaccine. You may need this if you have certain conditions or if you travel or work in places where you may be exposed to hepatitis A.  Hepatitis B vaccine. You may need this if you have certain conditions or if you travel or work in places where you may be exposed to hepatitis B.  Haemophilus influenzae type b (Hib) vaccine. You may need this if you have certain conditions. Talk to your health care provider about which screenings and vaccines you need and how often you need them. This information is not intended to replace advice given to you by your health care provider. Make sure you discuss any questions you have with your health care provider. Document Released: 04/17/2015 Document Revised: 12/09/2015 Document Reviewed: 01/20/2015 Elsevier Interactive Patient Education  2017 [employer]., DO

## 2016-07-26 ENCOUNTER — Ambulatory Visit (INDEPENDENT_AMBULATORY_CARE_PROVIDER_SITE_OTHER): Payer: Medicare Other | Admitting: Family Medicine

## 2016-07-26 ENCOUNTER — Encounter: Payer: Self-pay | Admitting: Family Medicine

## 2016-07-26 VITALS — BP 102/80 | HR 75 | Temp 97.9°F | Ht 62.0 in | Wt 152.1 lb

## 2016-07-26 DIAGNOSIS — Z8639 Personal history of other endocrine, nutritional and metabolic disease: Secondary | ICD-10-CM | POA: Diagnosis not present

## 2016-07-26 DIAGNOSIS — Z7289 Other problems related to lifestyle: Secondary | ICD-10-CM

## 2016-07-26 DIAGNOSIS — E785 Hyperlipidemia, unspecified: Secondary | ICD-10-CM | POA: Diagnosis not present

## 2016-07-26 DIAGNOSIS — Z0000 Encounter for general adult medical examination without abnormal findings: Secondary | ICD-10-CM

## 2016-07-26 DIAGNOSIS — R112 Nausea with vomiting, unspecified: Secondary | ICD-10-CM

## 2016-07-26 DIAGNOSIS — R739 Hyperglycemia, unspecified: Secondary | ICD-10-CM

## 2016-07-26 DIAGNOSIS — Z23 Encounter for immunization: Secondary | ICD-10-CM

## 2016-07-26 LAB — TSH: TSH: 0.9 u[IU]/mL (ref 0.35–4.50)

## 2016-07-26 LAB — LIPID PANEL
CHOLESTEROL: 180 mg/dL (ref 0–200)
HDL: 55.6 mg/dL (ref >39.00)
LDL Cholesterol: 100 mg/dL — ABNORMAL HIGH (ref 0–99)
NonHDL: 124.43
TRIGLYCERIDES: 122 mg/dL (ref 0.0–149.0)
Total CHOL/HDL Ratio: 3
VLDL: 24.4 mg/dL (ref 0.0–40.0)

## 2016-07-26 LAB — BASIC METABOLIC PANEL
BUN: 12 mg/dL (ref 6–23)
CALCIUM: 9.9 mg/dL (ref 8.4–10.5)
CO2: 31 mEq/L (ref 19–32)
Chloride: 103 mEq/L (ref 96–112)
Creatinine, Ser: 0.85 mg/dL (ref 0.40–1.20)
GFR: 70.17 mL/min (ref >60.00)
GLUCOSE: 93 mg/dL (ref 70–99)
Potassium: 4.4 mEq/L (ref 3.5–5.1)
SODIUM: 139 meq/L (ref 135–145)

## 2016-07-26 LAB — HEMOGLOBIN A1C: Hgb A1c MFr Bld: 5.9 % (ref 4.6–6.5)

## 2016-07-26 NOTE — Progress Notes (Signed)
Pre visit review using our clinic review tool, if applicable. No additional management support is needed unless otherwise documented below in the visit note. 

## 2016-07-26 NOTE — Patient Instructions (Signed)
BEFORE YOU LEAVE: -follow up: Medicare exam with [name] and follow up with Dr. Maudie Mercury - same day in 1 year -tetanus booster -labs  We have ordered labs or studies at this visit. It can take up to 1-2 weeks for results and processing. IF results require follow up or explanation, we will call you with instructions. Clinically stable results will be released to your [facility]. If you have not heard from [place] or cannot find your results in [facility] in 2 weeks please contact our office at [phone].  If you are not yet signed up for [facility], please consider signing up.  Please bring a copy of you advanced directives to our office.   Ms. Carstens , Thank you for taking time to come for your Medicare Wellness Visit. I appreciate your ongoing commitment to your health goals. Please review the following plan we discussed and let me know if I can assist you in the future.   These are the goals we discussed: Goals    See below      This is a list of the screening recommended for you and due dates:  Health Maintenance  Topic Date Due  . Flu Shot  Declined  . Tetanus Vaccine  Today  .  Hepatitis C: One time screening is recommended by Center for Disease Control  (CDC) for  adults born from [birth year] through 1965.   Today  . Pneumonia vaccines (1 of 2 - PCV13) Declined today  . Mammogram  08/27/2016  . Stool Blood Test  06/10/2017  . DEXA scan (bone density measurement)  Completed  *Topic was postponed. The date shown is not the original due date.     Preventive Care 60 Years and Older, Female Preventive care refers to lifestyle choices and visits with your health care provider that can promote health and wellness. What does preventive care include?  A yearly physical exam. This is also called an annual well check.  Dental exams once or twice a year.  Routine eye exams. Ask your health care provider how often you should have your eyes checked.  Personal lifestyle choices, including:  Daily care of  your teeth and gums.  Regular physical activity.  Eating a healthy diet.  Avoiding tobacco and drug use.  Limiting alcohol use.  Practicing safe sex.  Taking low-dose aspirin every day.  Taking vitamin and mineral supplements as recommended by your health care provider. What happens during an annual well check? The services and screenings done by your health care provider during your annual well check will depend on your age, overall health, lifestyle risk factors, and family history of disease. Counseling  Your health care provider may ask you questions about your:  Alcohol use.  Tobacco use.  Drug use.  Emotional well-being.  Home and relationship well-being.  Sexual activity.  Eating habits.  History of falls.  Memory and ability to understand (cognition).  Work and work Statistician.  Reproductive health. Screening  You may have the following tests or measurements:  Height, weight, and BMI.  Blood pressure.  Lipid and cholesterol levels. These may be checked every 5 years, or more frequently if you are over 67 years old.  Skin check.  Lung cancer screening. You may have this screening every year starting at age 70 if you have a 30-pack-year history of smoking and currently smoke or have quit within the past 15 years.  Fecal occult blood test (FOBT) of the stool. You may have this test every year starting at  age 64.  Flexible sigmoidoscopy or colonoscopy. You may have a sigmoidoscopy every 5 years or a colonoscopy every 10 years starting at age 73.  Hepatitis C blood test.  Hepatitis B blood test.  Sexually transmitted disease (STD) testing.  Diabetes screening. This is done by checking your blood sugar (glucose) after you have not eaten for a while (fasting). You may have this done every 1-3 years.  Bone density scan. This is done to screen for osteoporosis. You may have this done starting at age 12.  Mammogram. This may be done every 1-2 years.  Talk to your health care provider about how often you should have regular mammograms. Talk with your health care provider about your test results, treatment options, and if necessary, the need for more tests. Vaccines  Your health care provider may recommend certain vaccines, such as:  Influenza vaccine. This is recommended every year.  Tetanus, diphtheria, and acellular pertussis (Tdap, Td) vaccine. You may need a Td booster every 10 years.  Varicella vaccine. You may need this if you have not been vaccinated.  Zoster vaccine. You may need this after age 65.  Measles, mumps, and rubella (MMR) vaccine. You may need at least one dose of MMR if you were born in 1957 or later. You may also need a second dose.  Pneumococcal 13-valent conjugate (PCV13) vaccine. One dose is recommended after age 67.  Pneumococcal polysaccharide (PPSV23) vaccine. One dose is recommended after age 72.  Meningococcal vaccine. You may need this if you have certain conditions.  Hepatitis A vaccine. You may need this if you have certain conditions or if you travel or work in places where you may be exposed to hepatitis A.  Hepatitis B vaccine. You may need this if you have certain conditions or if you travel or work in places where you may be exposed to hepatitis B.  Haemophilus influenzae type b (Hib) vaccine. You may need this if you have certain conditions. Talk to your health care provider about which screenings and vaccines you need and how often you need them. This information is not intended to replace advice given to you by your health care provider. Make sure you discuss any questions you have with your health care provider. Document Released: 04/17/2015 Document Revised: 12/09/2015 Document Reviewed: 01/20/2015 Elsevier Interactive Patient Education  2017 [employer].

## 2016-07-26 NOTE — Addendum Note (Signed)
Addended by: Lahoma Crocker A on: 07/26/2016 11:40 AM   Modules accepted: Orders

## 2016-07-27 LAB — HEPATITIS C ANTIBODY: HCV AB: NEGATIVE

## 2016-08-16 DIAGNOSIS — H04123 Dry eye syndrome of bilateral lacrimal glands: Secondary | ICD-10-CM | POA: Diagnosis not present

## 2016-08-23 ENCOUNTER — Encounter: Payer: Self-pay | Admitting: Family Medicine

## 2016-08-23 ENCOUNTER — Ambulatory Visit (INDEPENDENT_AMBULATORY_CARE_PROVIDER_SITE_OTHER): Payer: Medicare Other | Admitting: Family Medicine

## 2016-08-23 VITALS — BP 100/60 | HR 77 | Temp 98.0°F | Ht 62.0 in

## 2016-08-23 DIAGNOSIS — R202 Paresthesia of skin: Secondary | ICD-10-CM

## 2016-08-23 DIAGNOSIS — R112 Nausea with vomiting, unspecified: Secondary | ICD-10-CM

## 2016-08-23 DIAGNOSIS — R519 Headache, unspecified: Secondary | ICD-10-CM

## 2016-08-23 DIAGNOSIS — R51 Headache: Secondary | ICD-10-CM

## 2016-08-23 NOTE — Patient Instructions (Signed)
BEFORE YOU LEAVE: -follow up:  1) follow up with Dr. Maudie Mercury in 3-6 months 2) Medicare exam with [name] and follow up with Dr. Maudie Mercury in 1 year if not already scheduled  -We placed a referral for you as discussed to the neurologist. It usually takes about 1-2 weeks to process and schedule this referral. If you have not heard from [place] regarding this appointment in 2 weeks please contact our office.  -Please keep a journal of the headaches and the nausea and vomiting. Bring to neurology appt.  Please call me if having nausea and vomiting not related to the headaches.

## 2016-08-23 NOTE — Progress Notes (Signed)
HPI:  Acute visit for several issues. First of all she has frequent headaches. Started maybe 1-2 years ago. Occur about 3-4 times per month. L sided, dull L frontal, accompanied by light and noise sensitivity, nausea, sometimes vomiting. Advil or aleve helps. No fevers, hx migraine prior, aura, weakness, numbness, recent syncope. Reports has paresthesias of the back of the head and the mid back intermittently the last few months. Has hx of repetitive syncope as a child, but not as an adult. She may sometimes have nausea and vomiting without a headache rarely, but she is not sure. She will feel nauseous then make herself throw up 1-2 times per month - she thinks this is with the headaches. Denies eating disorder or bulimia, change in bowels, melena, hematochezia. Sees optho for glaucoma. Reports told her opthomologist about these symptoms and headaches, nausea not felt related to the glaucoma.  ROS: See pertinent positives and negatives per HPI.  Past Medical History:  Diagnosis Date  . Cholelithiasis    rare symptoms, declined surgery  . Colitis ?1975  . Hemorrhoids   . Lichen sclerosus    sees gyn for management  . Mitral valve prolapse   . Osteoporosis    sees gyn; refused medication  . Varicose veins     Past Surgical History:  Procedure Laterality Date  . jaw resection    . RHINOPLASTY    . TONSILLECTOMY    . TUBAL LIGATION     bilteral    Family History  Problem Relation Age of Onset  . Colon cancer Brother 68  . Pancreatic cancer Brother     Social History   Social History  . Marital status: Married    Spouse name: N/A  . Number of children: N/A  . Years of education: N/A   Social History Main Topics  . Smoking status: Never Smoker  . Smokeless tobacco: Never Used  . Alcohol use No     Comment: wine-rare  . Drug use: No  . Sexual activity: Not Asked   Other Topics Concern  . None   Social History Narrative   Work or School: retired - Designer, fashion/clothing Situation: lives with husband      Spiritual Beliefs: Christian      Lifestyle: no regular exercise; diet is good        Current Outpatient Prescriptions:  .  albuterol (VENTOLIN HFA) 108 (90 Base) MCG/ACT inhaler, Inhale 2 puffs into the lungs every 4 (four) hours as needed., Disp: 1 Inhaler, Rfl: 1 .  CALCIUM PO, Take by mouth., Disp: , Rfl:  .  Lactobacillus (ULTIMATE PROBIOTIC FORMULA PO), Take 1 capsule by mouth daily. Ultimate Flora Probiotic, Disp: , Rfl:  .  Multiple Vitamin (MULTIVITAMINS PO), Take 1 tablet by mouth daily.  , Disp: , Rfl:   EXAM:  Vitals:   08/23/16 1329  BP: 100/60  Pulse: 77  Temp: 98 F (36.7 C)    There is no height or weight on file to calculate BMI.  GENERAL: vitals reviewed and listed above, alert, oriented, appears well hydrated and in no acute distress  HEENT: atraumatic, conjunttiva clear, no obvious abnormalities on inspection of external nose and ears  NECK: no obvious masses on inspection  LUNGS: clear to auscultation bilaterally, no wheezes, rales or rhonchi, good air movement  CV: HRRR, no peripheral edema  ABD: BS+, soft, NTTP  MS: moves all extremities without noticeable abnormality  PSYCH/NEURO: pleasant and cooperative, no obvious  depression or anxiety, CN II-XII grossly intact, speech and thought processing grossly intact, gait normal, finger to nose normal  ASSESSMENT AND PLAN:  Discussed the following assessment and plan:  Frequent headaches - Plan: Ambulatory referral to Neurology  Paresthesias - Plan: Ambulatory referral to Neurology  Nausea and vomiting, intractability of vomiting not specified, unspecified vomiting type  -we discussed possible serious and likely etiologies, workup and treatment, treatment risks and return precautions; query late onset atypical migraine, but given late onset and the paresthesias needs neuroimaging and potentially neurology evaluation -after this discussion, Brandi Villarreal opted  for referral to neurology and holding on neuroimaging until evaluation with specialist, journal -it is unclear to me if the nausea/vomiting is always with the headaches, she is unsure, advised headache and nausea/vomiting journal and that she contact me if any nausea/vomiting not associated with the headaches -wt stable (152 when established, 152 at recent physical) -follow up advised 3 months -of course, we advised Brandi Villarreal  to return or notify a doctor immediately if symptoms worsen or persist or new concerns arise.   Patient Instructions  BEFORE YOU LEAVE: -follow up:  1) follow up with Dr. Maudie Mercury in 3-6 months 2) Medicare exam with [name] and follow up with Dr. Maudie Mercury in 1 year if not already scheduled  -We placed a referral for you as discussed to the neurologist. It usually takes about 1-2 weeks to process and schedule this referral. If you have not heard from [place] regarding this appointment in 2 weeks please contact our office.  -Please keep a journal of the headaches and the nausea and vomiting. Bring to neurology appt.  Please call me if having nausea and vomiting not related to the headaches.     Colin Benton R., DO

## 2016-08-25 ENCOUNTER — Telehealth: Payer: Self-pay | Admitting: Family Medicine

## 2016-08-25 ENCOUNTER — Encounter: Payer: Self-pay | Admitting: Neurology

## 2016-08-25 NOTE — Telephone Encounter (Signed)
Pt just got off the phone with [facility] and the first available if Nov 23, 2016 pt was wondering if Dr. Maudie Mercury could get her in sooner.  Pt is aware she may want to try to call daily to see if they have any cancellations.

## 2016-08-26 NOTE — Telephone Encounter (Signed)
I called the pt and informed her of the message below.  Patient stated she will await the appt with the neurologist for the MRI as Dr Maudie Mercury told her the insurance would be more likely to cover this if ordered there.

## 2016-08-26 NOTE — Telephone Encounter (Signed)
They likely will be unable to schedule sooner - but would advise she request to be on wait list and that she call back on regular basis to see if appt opened up. Also if she would like we could order MRI of the brain in the interim. Ok to order if she wishes.

## 2016-08-31 DIAGNOSIS — H04123 Dry eye syndrome of bilateral lacrimal glands: Secondary | ICD-10-CM | POA: Diagnosis not present

## 2016-09-19 ENCOUNTER — Ambulatory Visit (INDEPENDENT_AMBULATORY_CARE_PROVIDER_SITE_OTHER): Payer: Medicare Other | Admitting: Neurology

## 2016-09-19 ENCOUNTER — Encounter: Payer: Self-pay | Admitting: Neurology

## 2016-09-19 VITALS — BP 110/80 | HR 75 | Ht 62.0 in | Wt 138.5 lb

## 2016-09-19 DIAGNOSIS — R202 Paresthesia of skin: Secondary | ICD-10-CM | POA: Diagnosis not present

## 2016-09-19 DIAGNOSIS — G44209 Tension-type headache, unspecified, not intractable: Secondary | ICD-10-CM

## 2016-09-19 NOTE — Progress Notes (Signed)
[facility] Clinic Note - Initial Visit   Date: 09/19/16  Brandi Villarreal MRN: [id number] DOB: [birth year]   Dear Dr. Maudie Mercury:  Thank you for your kind referral of Brandi Villarreal for consultation of headaches and paresthesias. Although her history is well known to you, please allow [place] to reiterate it for the purpose of our medical record. The patient was accompanied to the clinic by self.    History of Present Illness: Brandi Villarreal is a 71 y.o. right-handed Caucasian female with no significant medical history presenting for evaluation of headaches.    Starting around 2-3 years, she has noticed left temporal pain, described as throbbing and sharp.  She was having these about once per week, lasting an hour.  She would take advil which seemed to help.  She does not have associated light or noise sensitivity.  Headaches do not wake her up from sleeping, are not worse in the morning, and do not get worse with coughing, sneezing, or bearing down. She has floaters for many years and has some light sensitivity, but this is not associated with headaches.  She also has long history of nausea and vomiting and was hospitalized for this several years ago.  She began keeping a food diary in May and has stopped eating dairy, coffee, bread, and red meat.  Since doing this, her headaches have significantly improved and she has only had one headache in the past month.   She also complains of an area under her shoulder blade on the left which itches, symptoms are intermittent and there is no idenfiable triggers or alleviating factors.  She denies any neck or back pain.   She has "creepy crawly" sensation occurring in the scalp for the past 5 years.  Since she stopped eating selfish, she no longer has this.    Out-side paper records, electronic medical record, and images have been reviewed where available and summarized as:  MRI brain 06/29/2001:  Unremarkable  Lab Results  Component  Value Date   TSH 0.90 07/26/2016   Lab Results  Component Value Date   HGBA1C 5.9 07/26/2016   Lab Results  Component Value Date   CHOL 180 07/26/2016   HDL 55.60 07/26/2016   LDLCALC 100 (H) 07/26/2016   TRIG 122.0 07/26/2016   CHOLHDL 3 07/26/2016     Past Medical History:  Diagnosis Date  . Cholelithiasis    rare symptoms, declined surgery  . Colitis ?1975  . Hemorrhoids   . Lichen sclerosus    sees gyn for management  . Mitral valve prolapse   . Osteoporosis    sees gyn; refused medication  . Varicose veins     Past Surgical History:  Procedure Laterality Date  . jaw resection    . RHINOPLASTY    . TONSILLECTOMY    . TUBAL LIGATION     bilteral     Medications:  Outpatient Encounter Prescriptions as of 09/19/2016  Medication Sig  . albuterol (VENTOLIN HFA) 108 (90 Base) MCG/ACT inhaler Inhale 2 puffs into the lungs every 4 (four) hours as needed.  Marland Kitchen CALCIUM PO Take by mouth.  . Lactobacillus (ULTIMATE PROBIOTIC FORMULA PO) Take 1 capsule by mouth daily. Ultimate Flora Probiotic  . Multiple Vitamin (MULTIVITAMINS PO) Take 1 tablet by mouth daily.     No facility-administered encounter medications on file as of 09/19/2016.      Allergies:  Allergies  Allergen Reactions  . Iodine     Anaphylaxis following IVP  dye  . Penicillins     hives  . Aspirin     itching  . Morphine     Mental status changes  . Sulfonamide Derivatives     itching  . Cortisone     Due to eye disorder  . Doxycycline Hives    Ringing in ears  . Fluticasone Propionate     ? Reaction-advised not to take per eye doctor  . Hydrocodone Itching    Family History: Family History  Problem Relation Age of Onset  . Colon cancer Brother 72  . Pancreatic cancer Brother     Social History: Social History  Substance Use Topics  . Smoking status: Never Smoker  . Smokeless tobacco: Never Used  . Alcohol use No     Comment: wine-rare   Social History   Social History  Narrative   Work or School: retired - Clinical cytogeneticist Situation: lives with husband      Spiritual Beliefs: Christian      Lifestyle: no regular exercise; diet is good       Review of Systems:  CONSTITUTIONAL: No fevers, chills, night sweats, +20lb weight loss.   EYES: No visual changes or eye pain ENT: No hearing changes.  No history of nose bleeds.   RESPIRATORY: No cough, wheezing and shortness of breath.   CARDIOVASCULAR: Negative for chest pain, and palpitations.   GI: Negative for abdominal discomfort, blood in stools or black stools.  No recent change in bowel habits.   GU:  No history of incontinence.   MUSCLOSKELETAL: No history of joint pain or swelling.  No myalgias.   SKIN: + lesions, rash, +itching.   HEMATOLOGY/ONCOLOGY: Negative for prolonged bleeding, bruising easily, and swollen nodes.  No history of cancer.   ENDOCRINE: Negative for cold or heat intolerance, polydipsia or goiter.   PSYCH:  No depression or anxiety symptoms.   NEURO: As Above.   Vital Signs:  BP 110/80   Pulse 75   Ht _0  (1.575 m)   Wt 138 lb 8 oz (62.8 kg)   SpO2 97%   BMI 25.33 kg/m  Pain Scale: 3 on a scale of 0-10   General Medical Exam:   General:  Well appearing, comfortable.   Eyes/ENT: see cranial nerve examination.   Neck: No masses appreciated.  Full range of motion without tenderness.  No carotid bruits. Respiratory:  Clear to auscultation, good air entry bilaterally.   Cardiac:  Regular rate and rhythm, no murmur.   Extremities:  No deformities, edema, or skin discoloration.  Skin:  No rashes or lesions.  Neurological Exam: MENTAL STATUS including orientation to time, place, person, recent and remote memory, attention span and concentration, language, and fund of knowledge is normal.  Speech is not dysarthric.  CRANIAL NERVES: II:  No visual field defects.  Unremarkable fundi.   III-IV-VI: Pupils equal round and reactive to light.  Normal conjugate,  extra-ocular eye movements in all directions of gaze.  No nystagmus.  No ptosis.   V:  Normal facial sensation.    VII:  Normal facial symmetry and movements.  No pathologic facial reflexes.  VIII:  Normal hearing and vestibular function.   IX-X:  Normal palatal movement.   XI:  Normal shoulder shrug and head rotation.   XII:  Normal tongue strength and range of motion, no deviation or fasciculation.  MOTOR: Motor strength is 5/5 throughout. No atrophy, fasciculations or abnormal movements.  No pronator drift.  Tone is normal.    MSRs:  Reflexes are 2+/4 throughout and symmetric.  Plantars are downgoing.   SENSORY:  Normal and symmetric perception of light touch, pinprick, vibration, and proprioception in the limbs and over the thorax  COORDINATION/GAIT: Normal finger-to- nose-finger.  Intact rapid alternating movements bilaterally.  Able to rise from a chair without using arms.  Gait narrow based and stable. Tandem and stressed gait intact.    IMPRESSION: 1.  Episodic tension headaches  - Headache frequency has markedly improved since making diet changes  - With her improving headaches and normal exam, I do not see any need for intracranial imaging  - There is no ropiness or tenderness over the temporal artery, nevertheless, I recommended ESR to look for temporal arteritis, however overall suspicion is very low.  She declined lab testing and will consider if headaches et worse  - OK treat  headaches with tylenol or ibuprofen. Limit to twice per week  2.  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  Paresthesias of the scalp, unclear etiology.  - Normal exam and symptoms do not fit a dermatome or cutaneous nerve distrubution   - These symptoms are  also improved since making diet changes, so do not recommend additional testing  - Check vitamin B12 if symptoms return  Return to clinic as needed  The duration of this appointment visit was 40 minutes of face-to-face time with the patient.  Greater than 50% of this time was spent in counseling, explanation of diagnosis, planning of further management, and coordination of care.   Thank you for allowing me to participate in patient's care.  If I can answer any additional questions, I would be pleased to do so.    Sincerely,    Rashauna Tep K. Posey Pronto, DO

## 2016-09-19 NOTE — Patient Instructions (Addendum)
OK to treat your headaches with tylenol or ibuprofen. Limit to twice per week If your symptoms get worse, please call my office for a return visit

## 2016-09-27 DIAGNOSIS — Z01419 Encounter for gynecological examination (general) (routine) without abnormal findings: Secondary | ICD-10-CM | POA: Diagnosis not present

## 2016-11-23 ENCOUNTER — Ambulatory Visit: Payer: Medicare Other | Admitting: Neurology

## 2016-12-22 ENCOUNTER — Encounter: Payer: Self-pay | Admitting: Family Medicine

## 2017-02-02 ENCOUNTER — Encounter: Payer: Self-pay | Admitting: Internal Medicine

## 2017-02-02 ENCOUNTER — Ambulatory Visit: Payer: Medicare Other | Admitting: Adult Health

## 2017-02-02 ENCOUNTER — Ambulatory Visit (INDEPENDENT_AMBULATORY_CARE_PROVIDER_SITE_OTHER): Payer: Medicare Other | Admitting: Internal Medicine

## 2017-02-02 VITALS — BP 102/62 | HR 77 | Temp 98.1°F | Ht 62.0 in

## 2017-02-02 DIAGNOSIS — B9789 Other viral agents as the cause of diseases classified elsewhere: Secondary | ICD-10-CM

## 2017-02-02 DIAGNOSIS — J069 Acute upper respiratory infection, unspecified: Secondary | ICD-10-CM

## 2017-02-02 NOTE — Patient Instructions (Addendum)
Acute bronchitis symptoms are generally not helped by antibiotics.  Take over-the-counter expectorants and cough medications such as  Mucinex DM.  Call if there is no improvement in 5 to 7 days or if  you develop worsening cough, fever, or new symptoms, such as shortness of breath or chest pain.  Hydrate and Humidify  Drink enough water to keep your urine clear or pale yellow. Staying hydrated will help to thin your mucus.  Use a cool mist humidifier to keep the humidity level in your home above 50%.  Inhale steam for 10-15 minutes, 3-4 times a day or as told by your health care provider. You can do this in the bathroom while a hot shower is running.  Limit your exposure to cool or dry air. Rest  Rest as much as possible.  

## 2017-02-02 NOTE — Progress Notes (Signed)
Subjective:    Patient ID: Brandi Villarreal, female    DOB: [birth year], 71 y.o.   MRN: [id number]  HPI  71 year old patient who presents with a two-week history of chest congestion and cough.  Early on she did have some chills, which has resolved.  Cough initially also was productive of some green sputum but this also has normalized.  Today she actually feels much better, but will be flying later in the week.  She was concerned about paroxysms of coughing while on the plane.  To family members have had similar symptoms and have recovered  Past Medical History:  Diagnosis Date  . Cholelithiasis    rare symptoms, declined surgery  . Colitis ?1975  . Hemorrhoids   . Lichen sclerosus    sees gyn for management  . Mitral valve prolapse   . Osteoporosis    sees gyn; refused medication  . Varicose veins      Social History   Social History  . Marital status: Married    Spouse name: N/A  . Number of children: N/A  . Years of education: N/A   Occupational History  . Not on file.   Social History Main Topics  . Smoking status: Never Smoker  . Smokeless tobacco: Never Used  . Alcohol use No     Comment: wine-rare  . Drug use: No  . Sexual activity: Not on file   Other Topics Concern  . Not on file   Social History Narrative   Work or School: retired - Merchandiser, retail      Home Situation: lives with husband      Spiritual Beliefs: Christian      Lifestyle: no regular exercise; diet is good       Past Surgical History:  Procedure Laterality Date  . jaw resection    . RHINOPLASTY    . TONSILLECTOMY    . TUBAL LIGATION     bilteral    Family History  Problem Relation Age of Onset  . Colon cancer Brother 77  . Pancreatic cancer Brother   . Atrial fibrillation Mother   . Alzheimer's disease Father   . Healthy Sister     Allergies  Allergen Reactions  . Iodine     Anaphylaxis following IVP dye  . Penicillins     hives  . Aspirin     itching  . Morphine      Mental status changes  . Sulfonamide Derivatives     itching  . Cortisone     Due to eye disorder  . Doxycycline Hives    Ringing in ears  . Fluticasone Propionate     ? Reaction-advised not to take per eye doctor  . Hydrocodone Itching    Current Outpatient Prescriptions on File Prior to Visit  Medication Sig Dispense Refill  . albuterol (VENTOLIN HFA) 108 (90 Base) MCG/ACT inhaler Inhale 2 puffs into the lungs every 4 (four) hours as needed. 1 Inhaler 1  . CALCIUM PO Take by mouth.     No current facility-administered medications on file prior to visit.     BP 102/62 (BP Location: Left Arm, Patient Position: Sitting, Cuff Size: Normal)   Pulse 77   Temp 98.1 F (36.7 C) (Oral)   Ht 5\' 2"  (1.575 m)   SpO2 98%     Review of Systems  Constitutional: Positive for chills and fatigue.  HENT: Negative for congestion, dental problem, hearing loss, rhinorrhea, sinus pressure, sore throat and tinnitus.  Eyes: Negative for pain, discharge and visual disturbance.  Respiratory: Positive for cough. Negative for shortness of breath.   Cardiovascular: Negative for chest pain, palpitations and leg swelling.  Gastrointestinal: Negative for abdominal distention, abdominal pain, blood in stool, constipation, diarrhea, nausea and vomiting.  Genitourinary: Negative for difficulty urinating, dysuria, flank pain, frequency, hematuria, pelvic pain, urgency, vaginal bleeding, vaginal discharge and vaginal pain.  Musculoskeletal: Negative for arthralgias, gait problem and joint swelling.  Skin: Negative for rash.  Neurological: Negative for dizziness, syncope, speech difficulty, weakness, numbness and headaches.  Hematological: Negative for adenopathy.  Psychiatric/Behavioral: Negative for agitation, behavioral problems and dysphoric mood. The patient is not nervous/anxious.        Objective:   Physical Exam  Constitutional: She is oriented to person, place, and time. She appears  well-developed and well-nourished.  HENT:  Head: Normocephalic.  Right Ear: External ear normal.  Left Ear: External ear normal.  Mouth/Throat: Oropharynx is clear and moist.  Eyes: Pupils are equal, round, and reactive to light. Conjunctivae and EOM are normal.  Neck: Normal range of motion. Neck supple. No thyromegaly present.  Cardiovascular: Normal rate, regular rhythm, normal heart sounds and intact distal pulses.   Pulmonary/Chest: Breath sounds normal. No respiratory distress. She has no wheezes. She has no rales.  Abdominal: Soft. Bowel sounds are normal. She exhibits no mass. There is no tenderness.  Musculoskeletal: Normal range of motion.  Lymphadenopathy:    She has no cervical adenopathy.  Neurological: She is alert and oriented to person, place, and time.  Skin: Skin is warm and dry. No rash noted.  Psychiatric: She has a normal mood and affect. Her behavior is normal.          Assessment & Plan:   Viral URI with cough.  Much improved today compared to yesterday.  Will continue symptomatic treatment Patient instructions discussed and presented Will call if unimproved or if she develops any new or worsening symptoms  Nyoka Cowden

## 2017-02-07 ENCOUNTER — Ambulatory Visit: Payer: Medicare Other | Admitting: Family Medicine

## 2017-02-09 ENCOUNTER — Ambulatory Visit: Payer: Medicare Other | Admitting: Family Medicine

## 2017-02-16 ENCOUNTER — Ambulatory Visit: Payer: Medicare Other | Admitting: Family Medicine

## 2017-02-16 DIAGNOSIS — H40013 Open angle with borderline findings, low risk, bilateral: Secondary | ICD-10-CM | POA: Diagnosis not present

## 2017-04-13 ENCOUNTER — Encounter: Payer: Self-pay | Admitting: Family Medicine

## 2017-04-19 LAB — FECAL OCCULT BLOOD, GUAIAC: Fecal Occult Blood: NEGATIVE

## 2017-05-23 ENCOUNTER — Encounter: Payer: Self-pay | Admitting: Family Medicine

## 2017-05-23 ENCOUNTER — Ambulatory Visit: Payer: Medicare Other | Admitting: Family Medicine

## 2017-05-23 ENCOUNTER — Ambulatory Visit (INDEPENDENT_AMBULATORY_CARE_PROVIDER_SITE_OTHER): Payer: Medicare Other | Admitting: Family Medicine

## 2017-05-23 VITALS — BP 100/62 | HR 72 | Temp 98.1°F | Ht 62.0 in

## 2017-05-23 DIAGNOSIS — R110 Nausea: Secondary | ICD-10-CM | POA: Diagnosis not present

## 2017-05-23 DIAGNOSIS — R0789 Other chest pain: Secondary | ICD-10-CM | POA: Diagnosis not present

## 2017-05-23 DIAGNOSIS — B349 Viral infection, unspecified: Secondary | ICD-10-CM

## 2017-05-23 NOTE — Progress Notes (Signed)
HPI:  Brandi Villarreal is a very sweet, anxious and talkative 72 year old here for an acute visit for numerous complaints.  She went to her friend's house last week, then the next day found out her friend had some sort of viral illness.  She came down with some nausea, vomited once, felt fatigued and mildly lightheaded and had some body aches.  She thinks she had a mild fever, but did not check her temperature.  Symptoms are completely resolved today.  She had been "shooting hoops"and had laid down a bunch of pine straw the day before as well.  She wonders if she could have had the flu or if this soreness was from the activity she had done.  She has felt fine the last few days.  Looked up the symptoms online was worried about the flu or possible stroke..  Did not any severe headache, neuro deficits, change in speech or language, weakness or numbness.  Symptoms resolved over the next 24-48 hours.  She also has had some sharp pains in her mid upper chest at times intermittently for some time.  These are very brief and last barely a second.  They usually occur at rest.  One episode happened when she was shooting hoops.  She has been doing a lot more jumping jacks lately.  Denies any other episodes with activity, shortness of breath, cough, dyspnea on exertion or any sustained chest pain.  She had a negative stress test in the past.  However, wonders if she should recheck this.  Otherwise she has not had any recurrence of headaches she had in the past.   ROS: See pertinent positives and negatives per HPI.  Past Medical History:  Diagnosis Date  . Cholelithiasis    rare symptoms, declined surgery  . Colitis ?1975  . Hemorrhoids   . Lichen sclerosus    sees gyn for management  . Mitral valve prolapse   . Osteoporosis    sees gyn; refused medication  . Varicose veins     Past Surgical History:  Procedure Laterality Date  . jaw resection    . RHINOPLASTY    . TONSILLECTOMY    . TUBAL LIGATION     bilteral    Family History  Problem Relation Age of Onset  . Colon cancer Brother 28  . Pancreatic cancer Brother   . Atrial fibrillation Mother   . Alzheimer's disease Father   . Healthy Sister     Social History   Socioeconomic History  . Marital status: Married    Spouse name: None  . Number of children: None  . Years of education: None  . Highest education level: None  Social Needs  . Financial resource strain: None  . Food insecurity - worry: None  . Food insecurity - inability: None  . Transportation needs - medical: None  . Transportation needs - non-medical: None  Occupational History  . None  Tobacco Use  . Smoking status: Never Smoker  . Smokeless tobacco: Never Used  Substance and Sexual Activity  . Alcohol use: No    Comment: wine-rare  . Drug use: No  . Sexual activity: None  Other Topics Concern  . None  Social History Narrative   Work or School: retired - Clinical cytogeneticist Situation: lives with husband      Spiritual Beliefs: Christian      Lifestyle: no regular exercise; diet is good     Current Outpatient Medications:  .  albuterol (VENTOLIN HFA) 108 (90 Base) MCG/ACT inhaler, Inhale 2 puffs into the lungs every 4 (four) hours as needed., Disp: 1 Inhaler, Rfl: 1 .  CALCIUM PO, Take by mouth., Disp: , Rfl:  .  Homeopathic Products (OSCILLOCOCCINUM PO), Take 1 vial by mouth every 6 (six) hours., Disp: , Rfl:   EXAM:  Vitals:   05/23/17 1617  BP: 100/62  Pulse: 72  Temp: 98.1 F (36.7 C)    Body mass index is 25.33 kg/m.  GENERAL: vitals reviewed and listed above, alert, oriented, appears well hydrated and in no acute distress  HEENT: atraumatic, conjunttiva clear, no obvious abnormalities on inspection of external nose and ears  NECK: no obvious masses on inspection  LUNGS: clear to auscultation bilaterally, no wheezes, rales or rhonchi, good air movement  CV: HRRR, no peripheral edema  MS: moves all extremities  without noticeable abnormality  PSYCH: pleasant and cooperative, no obvious depression or anxiety, cranial nerves II through XII grossly intact, finger to nose is normal, speech and thought processing are grossly intact  ASSESSMENT AND PLAN:  Discussed the following assessment and plan:  Nausea  Viral illness  Atypical chest pain - Plan: Exercise Tolerance Test   -we discussed possible serious and likely etiologies, workup and treatment, treatment risks and return precautions -Sounds like she most likely did have a mild viral illness that has resolved for the most part, did offer flu testing and also because of her concern for stroke we could do an MRI, however she has no neuro deficits and her symptoms seem very atypical.  She declined at this point.  She agrees to follow-up if any further symptoms.  She does want to go ahead and a stress test for the atypical chest pain.  This is likely musculoskeletal.  Order placed. -follow up advised promptly if any symptoms recur or new use concerns and also for her physical in 2 months    Patient Instructions  BEFORE YOU LEAVE: -print out last set of labs -follow up: AWV with [name] and follow up with me in April after 28th, labs that day  -We placed a referral for you as discussed for the heart test. It usually takes about 1-2 weeks to process and schedule this referral. If you have not heard from [place] regarding this appointment in 2 weeks please contact our office.   I am glad you are feeling better.  Please let us know if you have any recurrence of symptoms or other concerns before your next visit.    Lucretia Kern, DO

## 2017-05-23 NOTE — Patient Instructions (Signed)
BEFORE YOU LEAVE: -print out last set of labs -follow up: AWV with [name] and follow up with me in April after 28th, labs that day  -We placed a referral for you as discussed for the heart test. It usually takes about 1-2 weeks to process and schedule this referral. If you have not heard from [place] regarding this appointment in 2 weeks please contact our office.   I am glad you are feeling better.  Please let us know if you have any recurrence of symptoms or other concerns before your next visit.

## 2017-05-24 DIAGNOSIS — H16223 Keratoconjunctivitis sicca, not specified as Sjogren's, bilateral: Secondary | ICD-10-CM | POA: Diagnosis not present

## 2017-06-28 ENCOUNTER — Ambulatory Visit: Payer: Self-pay | Admitting: *Deleted

## 2017-06-28 ENCOUNTER — Other Ambulatory Visit: Payer: Self-pay

## 2017-06-28 ENCOUNTER — Emergency Department (HOSPITAL_COMMUNITY): Payer: Medicare Other

## 2017-06-28 ENCOUNTER — Emergency Department (HOSPITAL_COMMUNITY)
Admission: EM | Admit: 2017-06-28 | Discharge: 2017-06-28 | Disposition: A | Discharge status: Home / Self Care | Payer: Medicare Other | Attending: Physician Assistant | Admitting: Physician Assistant

## 2017-06-28 ENCOUNTER — Encounter (HOSPITAL_COMMUNITY): Payer: Self-pay | Admitting: Emergency Medicine

## 2017-06-28 DIAGNOSIS — M25512 Pain in left shoulder: Secondary | ICD-10-CM | POA: Diagnosis not present

## 2017-06-28 DIAGNOSIS — R51 Headache: Secondary | ICD-10-CM | POA: Diagnosis not present

## 2017-06-28 DIAGNOSIS — R6884 Jaw pain: Secondary | ICD-10-CM | POA: Insufficient documentation

## 2017-06-28 DIAGNOSIS — G8929 Other chronic pain: Secondary | ICD-10-CM | POA: Diagnosis not present

## 2017-06-28 DIAGNOSIS — R079 Chest pain, unspecified: Secondary | ICD-10-CM | POA: Diagnosis not present

## 2017-06-28 DIAGNOSIS — R42 Dizziness and giddiness: Secondary | ICD-10-CM | POA: Diagnosis not present

## 2017-06-28 DIAGNOSIS — R0602 Shortness of breath: Secondary | ICD-10-CM | POA: Diagnosis not present

## 2017-06-28 LAB — CBC
HCT: 48.3 % — ABNORMAL HIGH (ref 36.0–46.0)
HEMOGLOBIN: 15.2 g/dL — AB (ref 12.0–15.0)
MCH: 31 pg (ref 26.0–34.0)
MCHC: 31.5 g/dL (ref 30.0–36.0)
MCV: 98.4 fL (ref 78.0–100.0)
PLATELETS: 199 10*3/uL (ref 150–400)
RBC: 4.91 MIL/uL (ref 3.87–5.11)
RDW: 13.4 % (ref 11.5–15.5)
WBC: 5.5 10*3/uL (ref 4.0–10.5)

## 2017-06-28 LAB — URINALYSIS, ROUTINE W REFLEX MICROSCOPIC
Bilirubin Urine: NEGATIVE
Glucose, UA: NEGATIVE mg/dL
Hgb urine dipstick: NEGATIVE
Ketones, ur: NEGATIVE mg/dL
LEUKOCYTES UA: NEGATIVE
NITRITE: NEGATIVE
PH: 6 (ref 5.0–8.0)
Protein, ur: NEGATIVE mg/dL
SPECIFIC GRAVITY, URINE: 1.009 (ref 1.005–1.030)

## 2017-06-28 LAB — BASIC METABOLIC PANEL
ANION GAP: 8 (ref 5–15)
BUN: 21 mg/dL — ABNORMAL HIGH (ref 6–20)
CALCIUM: 9.4 mg/dL (ref 8.9–10.3)
CO2: 25 mmol/L (ref 22–32)
CREATININE: 0.79 mg/dL (ref 0.44–1.00)
Chloride: 105 mmol/L (ref 101–111)
Glucose, Bld: 94 mg/dL (ref 65–99)
Potassium: 4.5 mmol/L (ref 3.5–5.1)
Sodium: 138 mmol/L (ref 135–145)

## 2017-06-28 LAB — I-STAT TROPONIN, ED
TROPONIN I, POC: 0 ng/mL (ref 0.00–0.08)
Troponin i, poc: 0 ng/mL (ref 0.00–0.08)

## 2017-06-28 LAB — TSH: TSH: 1.155 u[IU]/mL (ref 0.350–4.500)

## 2017-06-28 LAB — SEDIMENTATION RATE: Sed Rate: 2 mm/hr (ref 0–22)

## 2017-06-28 LAB — C-REACTIVE PROTEIN

## 2017-06-28 IMAGING — CR DG CHEST 2V
2 series · 2 of 2 positions shown · non-contrast
Comparison: [DATE]

CLINICAL DATA: Chest pain and shortness of breath

EXAM:
CHEST - 2 VIEW

[w chest pa]
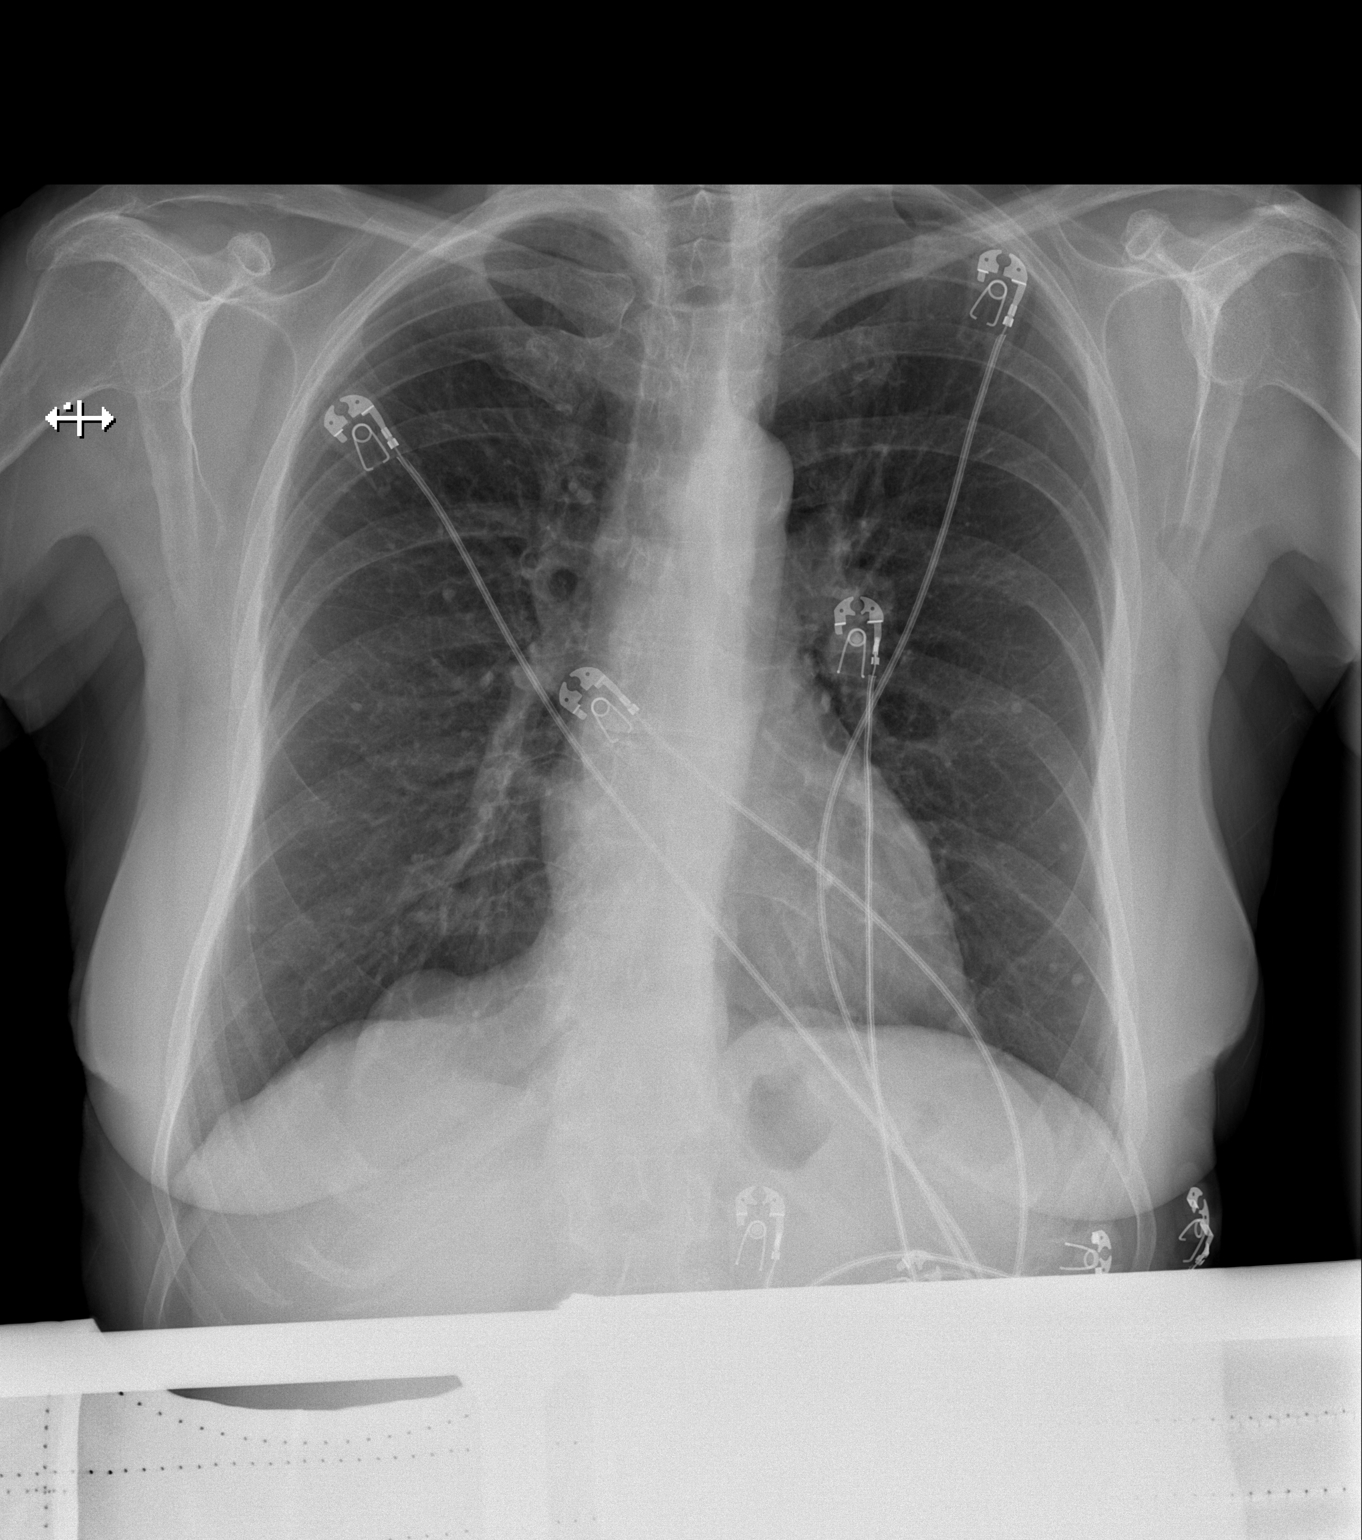

[w chest lat]
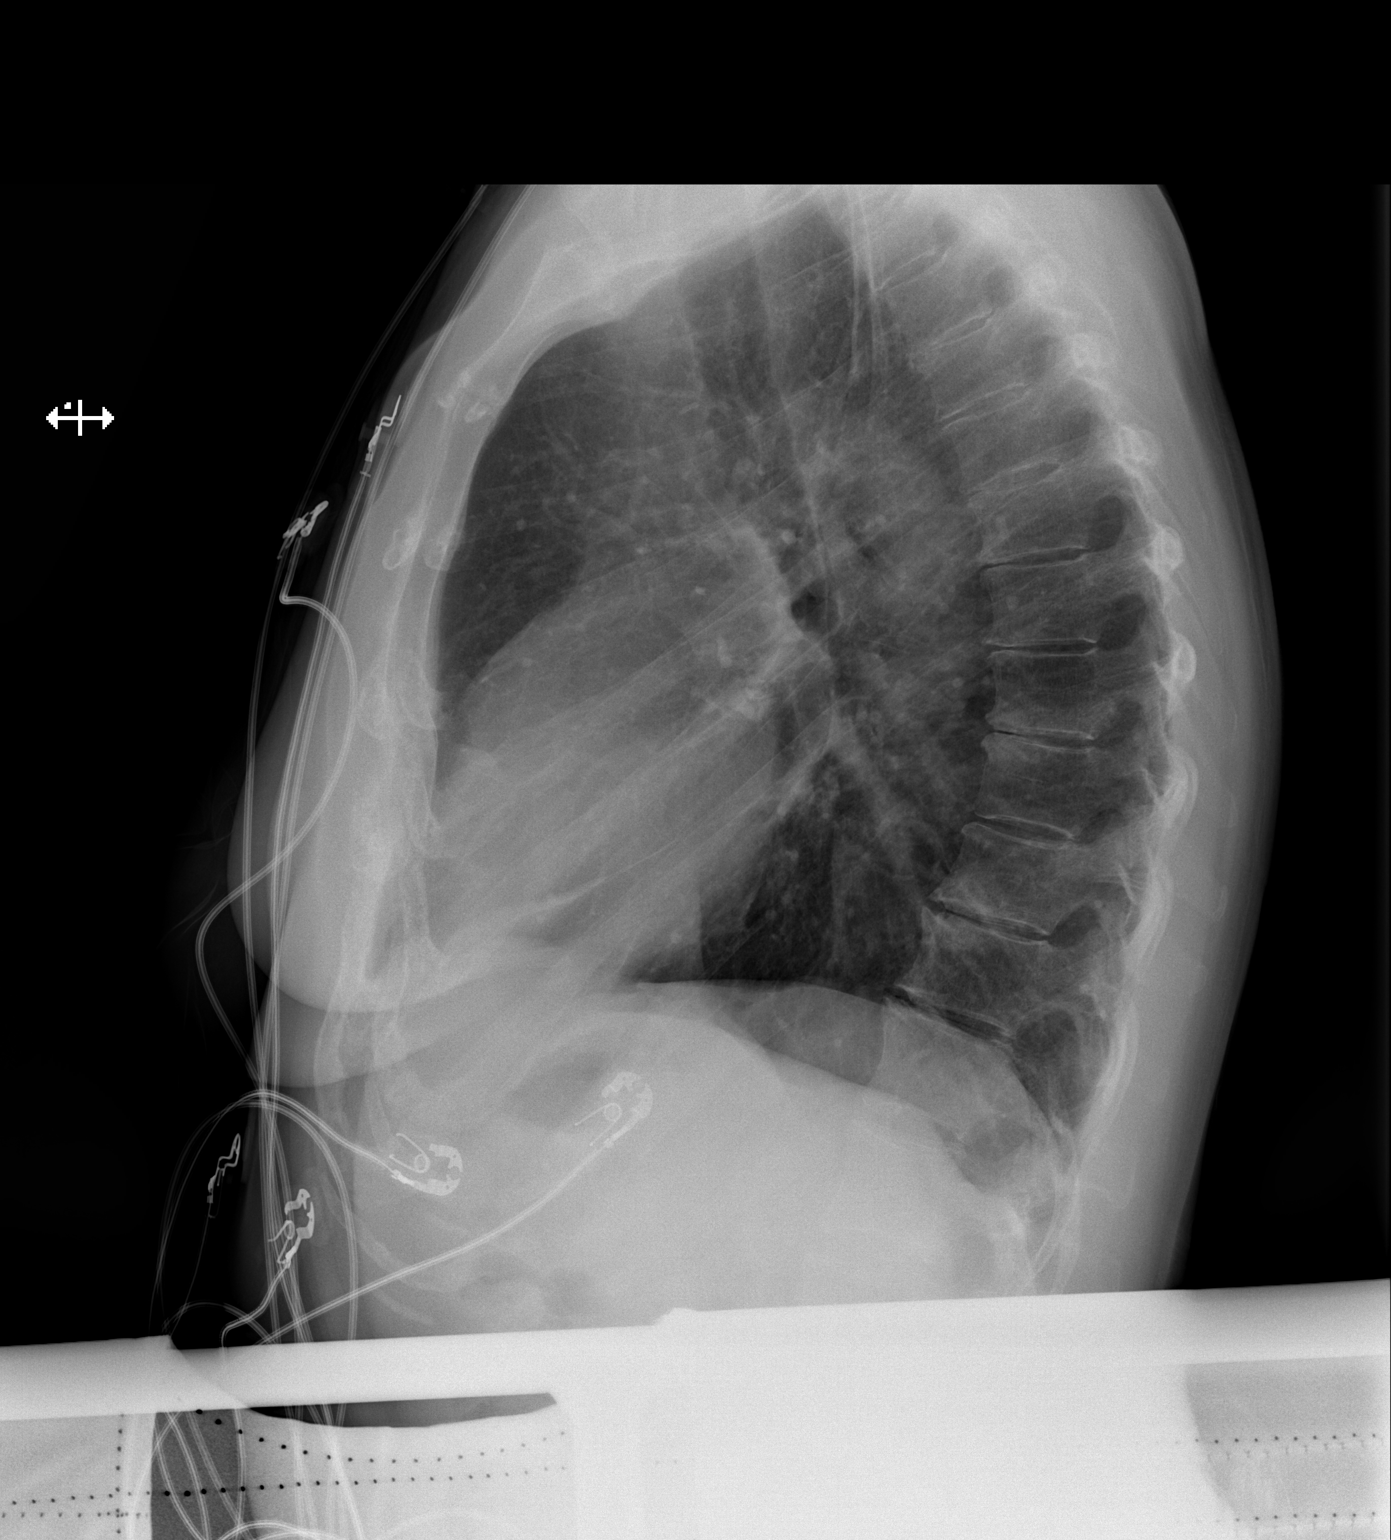

[2 of 2 positions shown; findings below may reference images not displayed]

FINDINGS: There are occasional small scattered calcified granulomas
bilaterally. There is no edema or consolidation. Heart size and
pulmonary vascularity are normal. No adenopathy. There is aortic
atherosclerosis. No bone lesions.
IMPRESSION: Scattered calcified granulomas. No edema or consolidation. Heart
size normal. There is aortic atherosclerosis.

Aortic Atherosclerosis ([UP]-[UP]).

## 2017-06-28 IMAGING — CT CT HEAD W/O CM
3 series · 14 of 45 positions shown, 16 images · non-contrast
Comparison: None.

CLINICAL DATA: Headache and dizziness.

EXAM:
CT HEAD WITHOUT CONTRAST
TECHNIQUE: Contiguous axial images were obtained from the base of the skull
through the vertex without intravenous contrast.

[Series 2: head wo · axial · 0.47mm/px · z∈[+1479,+1594]mm · 8 of 28 slices shown, 10 images]
[im 3/28  brain]
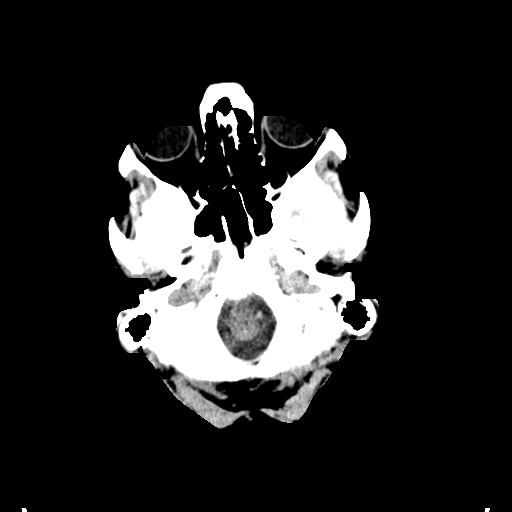
[im 3/28  bone]
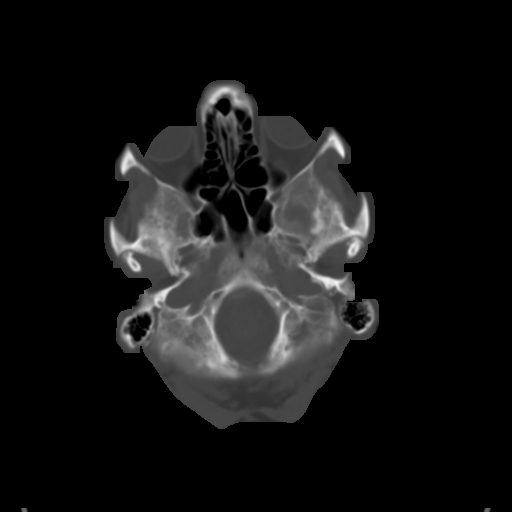
[im 6/28  brain]
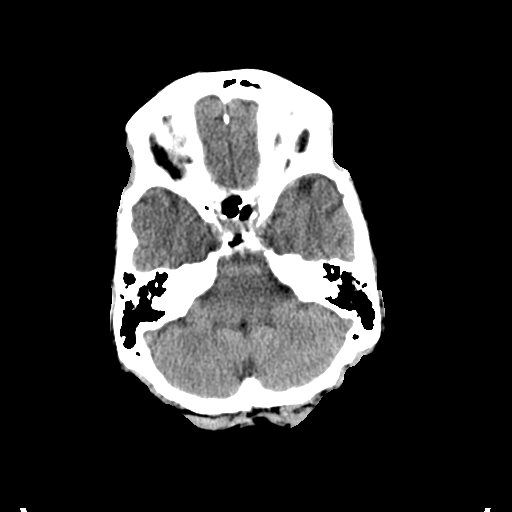
[im 10/28  brain]
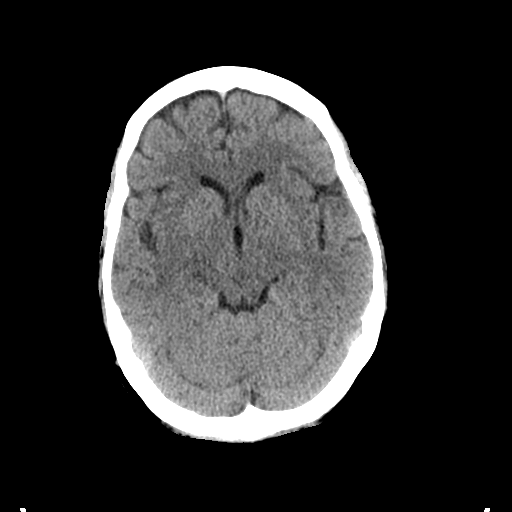
[im 13/28  brain]
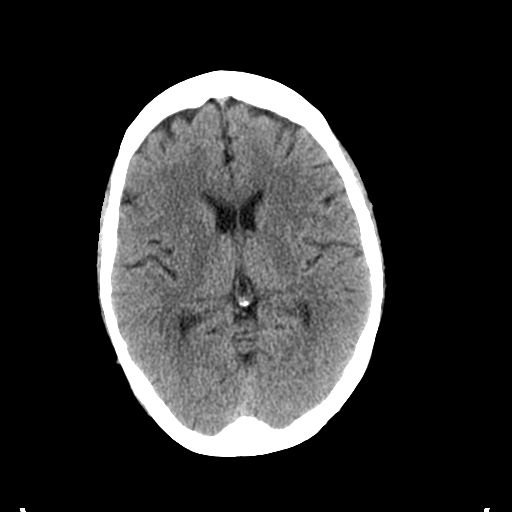
[im 16/28  brain]
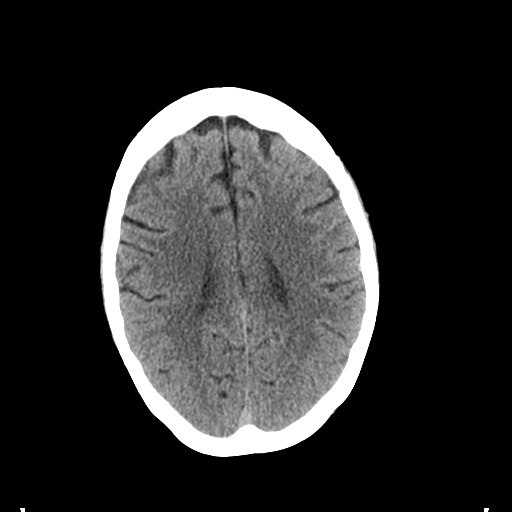
[im 16/28  bone]
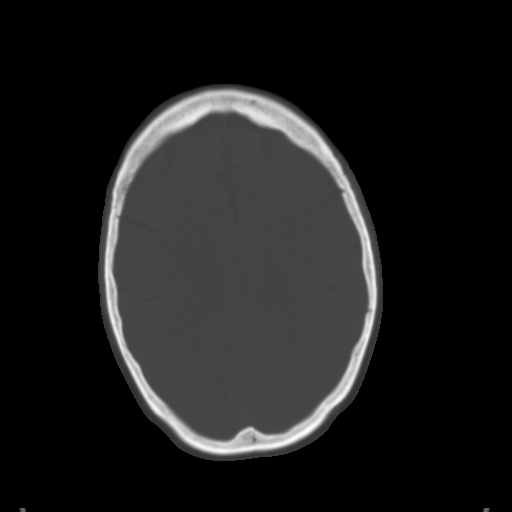
[im 19/28  brain]
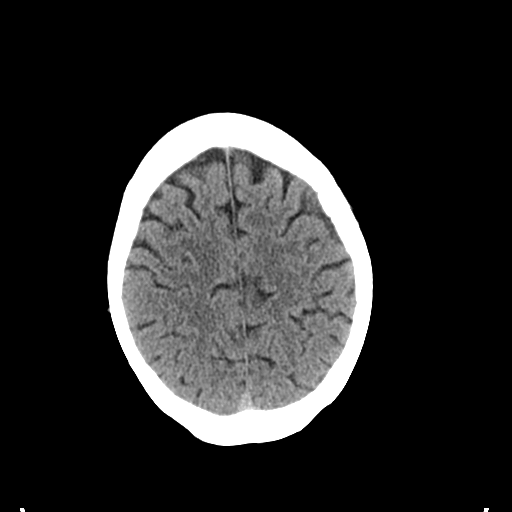
[im 23/28  brain]
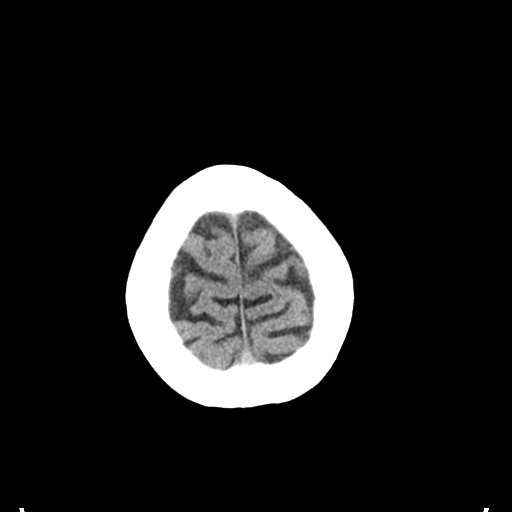
[im 26/28  brain]
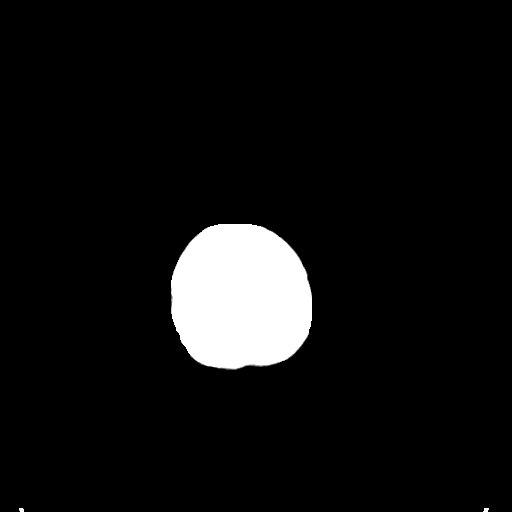

[Series 4: coronal soft tissue · coronal · 0.34mm/px · 3 of 61 slices shown]
[im 21/61  brain]
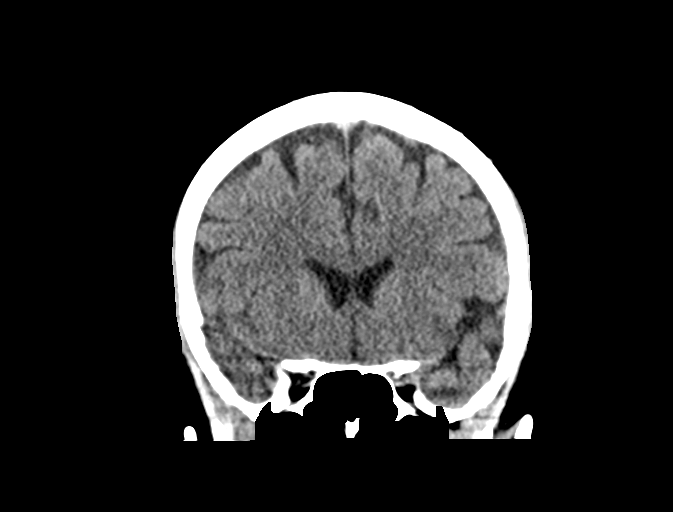
[im 27/61  brain]
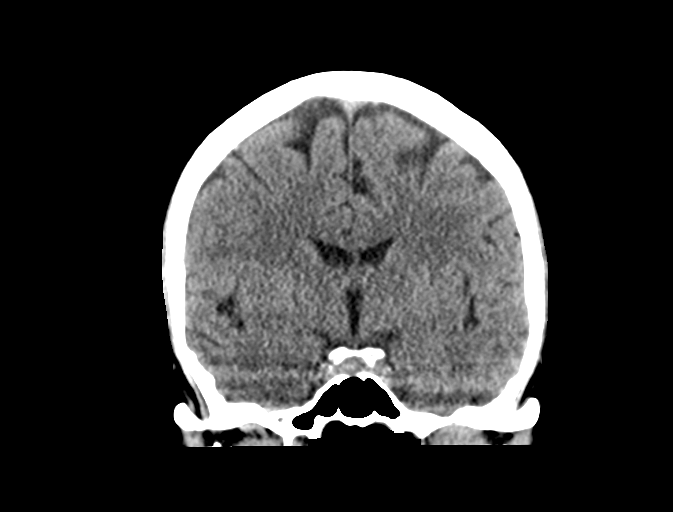
[im 34/61  brain]
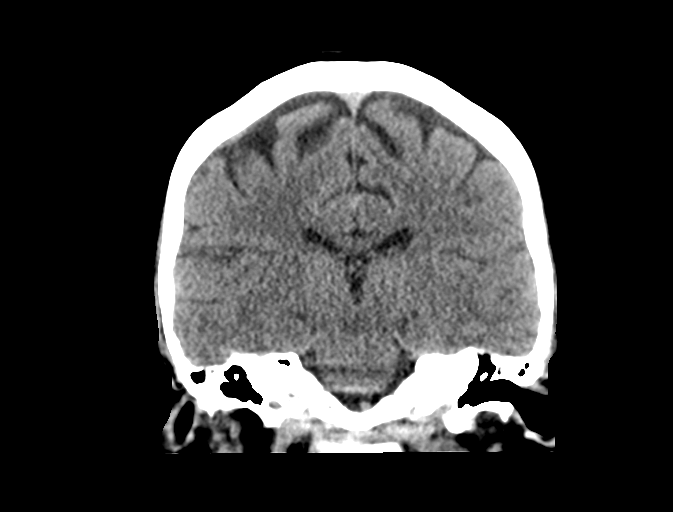

[Series 5: sagittal soft tissue · sagittal · 0.33mm/px · 3 of 46 slices shown]
[im 16/46  brain]
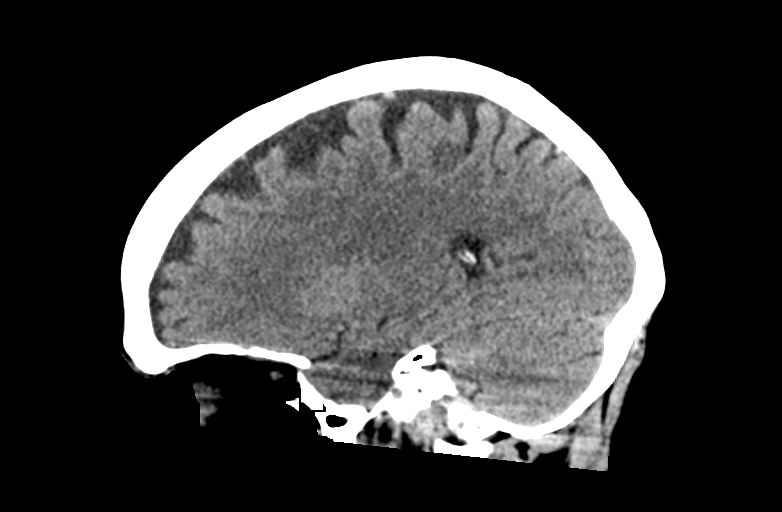
[im 23/46  brain]
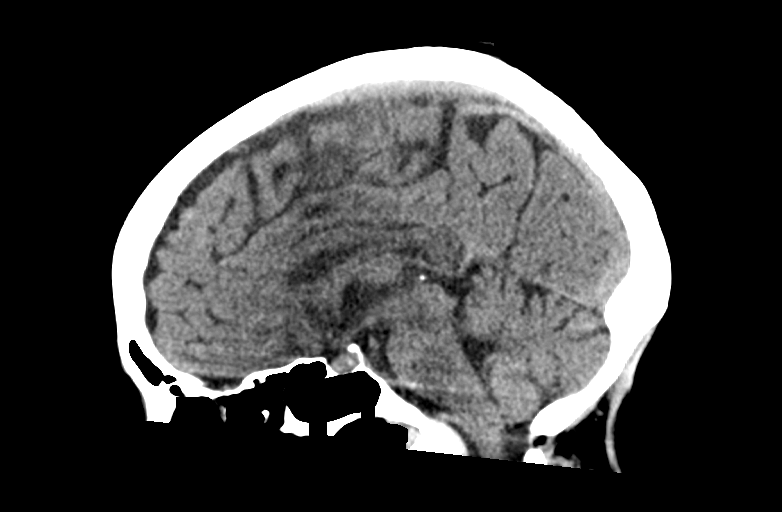
[im 31/46  brain]
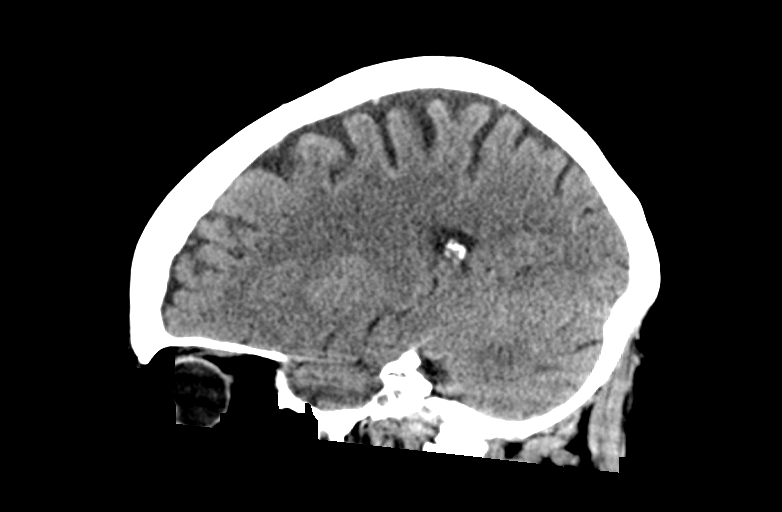

[14 of 45 positions shown; findings below may reference images not displayed]

FINDINGS: Brain: No evidence of acute infarction, hemorrhage, hydrocephalus,
extra-axial collection or mass lesion/mass effect. Mild age related
cerebral atrophy.

Vascular: No hyperdense vessel or unexpected calcification.

Skull: Normal. Negative for fracture or focal lesion.

Sinuses/Orbits: No acute finding.

Other: None.
IMPRESSION: 1. Normal for age noncontrast head CT.

## 2017-06-28 MED ORDER — SODIUM CHLORIDE 0.9 % IV BOLUS
500.0000 mL | Freq: Once | INTRAVENOUS | Status: AC
  Administered 2017-06-28: 500 mL via INTRAVENOUS

## 2017-06-28 NOTE — ED Notes (Signed)
Patient transported to CT 

## 2017-06-28 NOTE — ED Provider Notes (Signed)
[facility] DEPT Provider Note   CSN: [id number] Arrival date & time: 06/28/17  4431     History   Chief Complaint Chief Complaint  Patient presents with  . Arm Pain    HPI Brandi Villarreal is a 72 y.o. female.  HPI   Brandi Villarreal is a 72 y.o. female, with a history of mitral valve prolapse, presenting to the ED with jaw pain that woke her up around 3:30 AM this morning.  There is accompanied by pain in the left shoulder radiating down the left arm as well as the left temporal headache.  Describes a sensation as "it's like someone pushing on it really hard.  Sometimes throbbing."   Patient called a nursing line, was told to wait until the office opened and call back.  She called the doctor's office again in around 8:30 AM and was told to come to the ED. Patient's symptoms lasted until approximately 12 PM.  She notes she has experienced each of these symptoms before intermittently, but got worried because they happened all at the same time. Headache consistent with previous headaches. Left arm pain consistent with previous pain. Patient also notes intermittent dizziness for past 3-4 weeks. "Feels like my head is spinning and I can't walk in a straight line."  Last anywhere from a few minutes to hours.  Accompanied by fatigue, dry eyes, and sleeping more.   Denies fever/chills, syncope, falls/trauma, N/V/D, cough, shortness of breath, chest pain, neuro deficits, vision loss, or any other complaints.   Past Medical History:  Diagnosis Date  . Cholelithiasis    rare symptoms, declined surgery  . Colitis ?1975  . Hemorrhoids   . Lichen sclerosus    sees gyn for management  . Mitral valve prolapse   . Osteoporosis    sees gyn; refused medication  . Varicose veins     Patient Active Problem List   Diagnosis Date Noted  . Cholelithiasis 03/20/2012  . Osteoporosis 08/09/2006    Past Surgical History:  Procedure Laterality Date  . jaw resection     . RHINOPLASTY    . TONSILLECTOMY    . TUBAL LIGATION     bilteral     OB History   None      Home Medications    Prior to Admission medications   Medication Sig Start Date End Date Taking? Authorizing Provider  Homeopathic Products (OSCILLOCOCCINUM PO) Take 1 vial by mouth every 6 (six) hours. 02/01/17  Yes [provider]  ibuprofen (ADVIL,MOTRIN) 200 MG tablet Take 200 mg by mouth every 6 (six) hours as needed for moderate pain.   Yes [provider]  OVER THE COUNTER MEDICATION Place 1 application into both nostrils daily as needed (sinus infection).   Yes [provider]  sodium chloride (OCEAN) 0.65 % SOLN nasal spray Place 2 sprays into both nostrils as needed for congestion.   Yes [provider]  albuterol (VENTOLIN HFA) 108 (90 Base) MCG/ACT inhaler Inhale 2 puffs into the lungs every 4 (four) hours as needed. Patient not taking: Reported on 06/28/2017 07/14/16   Lucretia Kern, DO    Family History Family History  Problem Relation Age of Onset  . Colon cancer Brother 41  . Pancreatic cancer Brother   . Atrial fibrillation Mother   . Alzheimer's disease Father   . Healthy Sister     Social History Social History   Tobacco Use  . Smoking status: Never Smoker  . Smokeless  tobacco: Never Used  Substance Use Topics  . Alcohol use: No    Comment: wine-rare  . Drug use: No     Allergies   Iodine; Penicillins; Aspirin; Morphine; Sulfonamide derivatives; Cortisone; Doxycycline; Fluticasone propionate; and Hydrocodone   Review of Systems Review of Systems  Constitutional: Negative for chills, diaphoresis and fever.  HENT: Negative for trouble swallowing and voice change.   Eyes: Negative for visual disturbance.  Respiratory: Negative for cough and shortness of breath.   Cardiovascular: Negative for chest pain, palpitations and leg swelling.  Gastrointestinal: Negative for abdominal pain, diarrhea, nausea and vomiting.    Musculoskeletal: Positive for arthralgias. Negative for back pain and neck pain.  Neurological: Positive for dizziness and headaches. Negative for syncope, weakness, light-headedness and numbness.  All other systems reviewed and are negative.    Physical Exam Updated Vital Signs BP 137/83 (BP Location: Left Arm)   Pulse 67   Temp 98 F (36.7 C) (Oral)   Resp 18   Ht '5\' 2"'  (1.575 m)   Wt 61.7 kg (136 lb)   SpO2 100%   BMI 24.87 kg/m   Physical Exam  Constitutional: She is oriented to person, place, and time. She appears well-developed and well-nourished. No distress.  HENT:  Head: Normocephalic and atraumatic.  Mouth/Throat: Oropharynx is clear and moist.  No tenderness to left temporal region.  Eyes: Pupils are equal, round, and reactive to light. Conjunctivae and EOM are normal.  Neck: Normal range of motion. Neck supple.  Cardiovascular: Normal rate, regular rhythm, normal heart sounds and intact distal pulses.  Pulmonary/Chest: Effort normal and breath sounds normal. No respiratory distress.  Abdominal: Soft. There is no tenderness. There is no guarding.  Musculoskeletal: She exhibits tenderness. She exhibits no edema or deformity.  Tenderness to left anterior proximal arm and shoulder.  Pain with range of motion, especially with flexion and extension at the shoulder.  Full passive and active range of motion in the cardinal directions of the left shoulder, elbow, and wrist. No noted swelling, increased warmth, erythema, laxity, instability, or deformity.  Normal motor function intact in all extremities and spine. No midline spinal tenderness.   Lymphadenopathy:    She has no cervical adenopathy.  Neurological: She is alert and oriented to person, place, and time.  Reflex Scores:      Bicep reflexes are 2+ on the right side and 2+ on the left side.      Brachioradialis reflexes are 2+ on the right side and 2+ on the left side.      Patellar reflexes are 3+ on the right  side and 3+ on the left side.      Achilles reflexes are 2+ on the right side and 2+ on the left side. No sensory deficits.  No noted speech deficits. No aphasia. Patient handles oral secretions without difficulty. No noted swallowing defects.  Equal grip strength bilaterally. Strength 5/5 in the upper extremities. Strength 5/5 with flexion and extension of the hips, knees, and ankles bilaterally.  Negative Romberg. No gait disturbance.  Coordination intact including heel to shin and finger to nose.  Cranial nerves III-XII grossly intact.  No facial droop.   Skin: Skin is warm and dry. She is not diaphoretic.  Psychiatric: She has a normal mood and affect. Her behavior is normal.  Nursing note and vitals reviewed.    ED Treatments / Results  Labs (all labs ordered are listed, but only abnormal results are displayed) Labs Reviewed  BASIC METABOLIC PANEL -  Abnormal; Notable for the following components:      Result Value   BUN 21 (*)    All other components within normal limits  CBC - Abnormal; Notable for the following components:   Hemoglobin 15.2 (*)    HCT 48.3 (*)    All other components within normal limits  TSH  URINALYSIS, ROUTINE W REFLEX MICROSCOPIC  SEDIMENTATION RATE  C-REACTIVE PROTEIN  I-STAT TROPONIN, ED  I-STAT TROPONIN, ED    EKG EKG Interpretation  Date/Time:  Wednesday June 28 2017 09:01:34 EDT Ventricular Rate:  68 PR Interval:    QRS Duration: 81 QT Interval:  384 QTC Calculation: 409 R Axis:   40 Text Interpretation:  Sinus rhythm Low voltage, precordial leads Nonspecific T abnormalities, lateral leads Normal sinus rhythm Confirmed by Zenovia Jarred [phone]) on 06/28/2017 2:38:55 PM   Radiology Dg Chest 2 View  Result Date: 06/28/2017 CLINICAL DATA:  Chest pain and shortness of breath EXAM: CHEST - 2 VIEW COMPARISON:  July 14, 2016 FINDINGS: There are occasional small scattered calcified granulomas bilaterally. There is no edema or  consolidation. Heart size and pulmonary vascularity are normal. No adenopathy. There is aortic atherosclerosis. No bone lesions. IMPRESSION: Scattered calcified granulomas. No edema or consolidation. Heart size normal. There is aortic atherosclerosis. Aortic Atherosclerosis (ICD10-I70.0). Electronically Signed   By: Lowella Grip III M.D.   On: 06/28/2017 09:48   Ct Head Wo Contrast  Result Date: 06/28/2017 CLINICAL DATA:  Headache and dizziness. EXAM: CT HEAD WITHOUT CONTRAST TECHNIQUE: Contiguous axial images were obtained from the base of the skull through the vertex without intravenous contrast. COMPARISON:  None. FINDINGS: Brain: No evidence of acute infarction, hemorrhage, hydrocephalus, extra-axial collection or mass lesion/mass effect. Mild age related cerebral atrophy. Vascular: No hyperdense vessel or unexpected calcification. Skull: Normal. Negative for fracture or focal lesion. Sinuses/Orbits: No acute finding. Other: None. IMPRESSION: 1. Normal for age noncontrast head CT. Electronically Signed   By: Titus Dubin M.D.   On: 06/28/2017 14:32    Procedures Procedures (including critical care time)  Medications Ordered in ED Medications  sodium chloride 0.9 % bolus 500 mL (0 mLs Intravenous Stopped 06/28/17 1641)     Initial Impression / Assessment and Plan / ED Course  I have reviewed the triage vital signs and the nursing notes.  Pertinent labs & imaging results that were available during my care of the patient were reviewed by me and considered in my medical decision making (see chart for details).  Clinical Course as of Jun 28 1752  Wed Jun 28, 2017  1435 Abnormalities noted have been noted on previous CXR over a year ago.  DG Chest 2 View [SJ]  104 Called PCP office. Dr. Maudie Mercury is out of the office until 4/1.  Spoke with Taneika, who states the other providers are all with their own patients and are unavailable to speak with me. She states I can schedule a follow up  appointment for the patient to be seen within the next few days.  She transferred me to patient scheduling. I reached a voicemail and left a message with callback number.   [SJ]    Clinical Course User Index [SJ] Amaar Oshita C, PA-C    Patient presents with a myriad of complaints including jaw and arm pain. Patient is nontoxic appearing, afebrile, not tachycardic, not tachypneic, not hypotensive, maintains excellent SPO2 on room air, and is in no apparent distress.  It was difficult to accurately discern what the patient was concerned  about today because she would fluidly switch between talking about things today and things from weeks or months ago throughout the interview. She has had documented instances of each of her symptoms over at least the past year, for which she has seen PCP and neurologist.    Low suspicion for ACS. HEART score is 3, indicating low risk for a cardiac event. Wells criteria score is 0, indicating low risk for PE.  Delta troponins negative.  No acute abnormalities on chest x-ray.  Very mild BUN elevation could be from minor dehydration, addressed with IV fluids.  Patient's symptoms resolved and did not recur during her ED course.  The patient was given instructions for home care as well as return precautions. Patient voices understanding of these instructions, accepts the plan, and is comfortable with discharge.  Findings and plan of care discussed with Zenovia Jarred, MD. Dr. Thomasene Lot personally evaluated and examined this patient.   Further patient care details: Review of the PCPs last note February 19 reveals PCP had discussed with patient regarding further testing and imaging, including MRI.  Patient declined.   Neurologist note from June 2018 notes that suspicion for temporal arteritis was low, however, ESR and CRP was discussed with the patient to help rule out.  Patient declined testing. My suspicion for temporal arteritis patient is also low, however, we will  send ESR and CRP and have PCP follow-up. 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 In summary, it looks as though patient has had each of these symptoms intermittently for over a year.   Vitals:   06/28/17 0903 06/28/17 1251 06/28/17 1521 06/28/17 1737  BP: (!) 144/87 137/83 134/76 123/72  Pulse: 70 67 (!) 56 63  Resp: '16 18 12 17  ' Temp: 98 F (36.7 C)     TempSrc: Oral     SpO2: 97% 100% 100% 100%  Weight:      Height:           Final Clinical Impressions(s) / ED Diagnoses   Final diagnoses:  Chronic left shoulder pain  Jaw pain    ED Discharge Orders    None       Layla Maw 06/28/17 1755    [place]ack[place], Fredia Sorrow, MD 07/04/17 1648

## 2017-06-28 NOTE — ED Notes (Signed)
Pt reports that she is unable to urinate at this time. She verbalized that she would call when she was able.

## 2017-06-28 NOTE — Telephone Encounter (Signed)
Confirmed patient has arrived in ER now.  

## 2017-06-28 NOTE — ED Notes (Signed)
Bed: [facility] Expected date:  Expected time:  Means of arrival:  Comments: 

## 2017-06-28 NOTE — ED Triage Notes (Signed)
Pt complaint of ongoing dizziness and left shoulder pain for 3 weeks; seen by PCP for same. New onset lew jaw pain and headache onset 0300 today.

## 2017-06-28 NOTE — Telephone Encounter (Signed)
Will monitor for ED arrival.  

## 2017-06-28 NOTE — Telephone Encounter (Signed)
Patient is calling to report that she had an appointment 06/07/17 for dizziness. Today she is experiencing L jaw pain and L shoulder pain with tingling in her finger tips. She states she has a headache and she has been experiencing vision changes for several days. She did take an aspirin this morning. Per protocol- she need to be evaluated for changes at Ed- patient advised to go and she states she will. Husband is going to take her. Reason for Disposition . Headache  (and neurologic deficit)  Answer Assessment - Initial Assessment Questions 1. SYMPTOM: "What is the main symptom you are concerned about?" (e.g., weakness, numbness)     L jaw pain, shoulder pain with fingers tingling, headache, dizziness 2. ONSET: "When did this start?" (minutes, hours, days; while sleeping)     Dizziness present since last visit- 06/07/17 3. LAST NORMAL: "When was the last time you were normal (no symptoms)?"     New symptoms present 4. PATTERN "Does this come and go, or has it been constant since it started?"  "Is it present now?"     Constant since started today 5. CARDIAC SYMPTOMS: "Have you had any of the following symptoms: chest pain, difficulty breathing, palpitations?"     No chest pain 6. NEUROLOGIC SYMPTOMS: "Have you had any of the following symptoms: headache, dizziness, vision loss, double vision, changes in speech, unsteady on your feet?"     Patient states she has been having vision changes for several days 7. OTHER SYMPTOMS: "Do you have any other symptoms?"     See above #1 8. PREGNANCY: "Is there any chance you are pregnant?" "When was your last menstrual period?"     n/a  Protocols used: NEUROLOGIC DEFICIT-A-AH

## 2017-06-28 NOTE — Discharge Instructions (Addendum)
Please follow up with your primary care provider as soon as possible.  There are lab tests ordered today called ESR and CRP, the results of which are pending and will need to be followed up upon by your PCP.  May need to follow-up with cardiologist, as previously detailed by your primary care provider.  Follow-up with the orthopedist for recurrent left shoulder pain.  Assessment for autoimmune disorders, such as Sjogren's may need to be considered and tested for.  This would not explain all of your symptoms, but may explain some including dry eyes and dry mouth.  Return to the ED for weakness on one side of your body, numbness, chest pain, shortness of breath, confusion, or any other major concerns.

## 2017-06-28 NOTE — ED Notes (Signed)
RN went in to start an IV, pt is currently at CT

## 2017-06-29 ENCOUNTER — Telehealth: Payer: Self-pay | Admitting: Family Medicine

## 2017-06-29 NOTE — Telephone Encounter (Signed)
Pt was seen in ER yesterday 06/28/2017, there was a voice message left on my desk phone from PA in the ER  to call pt and schedule a follow up with PCP. I left a voice message for pt to return my call to schedule appointment.

## 2017-07-03 NOTE — Telephone Encounter (Signed)
I tried to reach pt again, no answer, will try again later.  

## 2017-07-18 ENCOUNTER — Encounter: Payer: Self-pay | Admitting: Family Medicine

## 2017-07-18 ENCOUNTER — Ambulatory Visit: Payer: Medicare Other | Admitting: Family Medicine

## 2017-07-18 VITALS — BP 90/60 | HR 83 | Temp 97.8°F | Ht 62.0 in | Wt 135.0 lb

## 2017-07-18 DIAGNOSIS — I700 Atherosclerosis of aorta: Secondary | ICD-10-CM

## 2017-07-18 DIAGNOSIS — M25512 Pain in left shoulder: Secondary | ICD-10-CM | POA: Diagnosis not present

## 2017-07-18 DIAGNOSIS — J3489 Other specified disorders of nose and nasal sinuses: Secondary | ICD-10-CM | POA: Diagnosis not present

## 2017-07-18 DIAGNOSIS — L723 Sebaceous cyst: Secondary | ICD-10-CM

## 2017-07-18 NOTE — Patient Instructions (Addendum)
BEFORE YOU LEAVE: -follow up: AWV with [name] and CPE with Dr. Maudie Mercury in 3 months - come fasting for fasting labs  Please schedule your mammogram  Please see the ear nose and throat doctor about the nose - please call number provided.  Follow up with your neurologist if headaches worse or dizziness   See orthopedic doctor as planned  Warm compresses for the bump. Follow up if worsens or persists.

## 2017-07-18 NOTE — Progress Notes (Signed)
HPI:  Using dictation device. Unfortunately this device frequently misinterprets words/phrases. Due for AWV.  Acute visit for Left shoulder pain: -chronic -evaluated in emergency room 06/28/17 as she was anxious could be heart related -reported headache, and dizziness as well at the time, had neg cardiac eval, normal CT head in ER, also chronic finding cxr without acute processes - reviewed with pt. She also had bmp, cbc, esr, tsh, crp in ER - unrevealing -today reports: -shoulder improving with wt lifting -seeing ortho for this - she gets a sore in her inner nose recurrently on the R -denies:worsening headaches or dizziness, fevers, CP, SOB -also has bump on R bottock she wants [place] to look at today -reports she completed a colon cancer screening with her insurance nurse   ROS: See pertinent positives and negatives per HPI.  Past Medical History:  Diagnosis Date  . Cholelithiasis    rare symptoms, declined surgery  . Colitis ?1975  . Hemorrhoids   . Lichen sclerosus    sees gyn for management  . Mitral valve prolapse   . Osteoporosis    sees gyn; refused medication  . Varicose veins     Past Surgical History:  Procedure Laterality Date  . jaw resection    . RHINOPLASTY    . TONSILLECTOMY    . TUBAL LIGATION     bilteral    Family History  Problem Relation Age of Onset  . Colon cancer Brother 77  . Pancreatic cancer Brother   . Atrial fibrillation Mother   . Alzheimer's disease Father   . Healthy Sister     SOCIAL HX: see hpi   Current Outpatient Medications:  .  albuterol (VENTOLIN HFA) 108 (90 Base) MCG/ACT inhaler, Inhale 2 puffs into the lungs every 4 (four) hours as needed., Disp: 1 Inhaler, Rfl: 1 .  Homeopathic Products (OSCILLOCOCCINUM PO), Take 1 vial by mouth every 6 (six) hours., Disp: , Rfl:  .  ibuprofen (ADVIL,MOTRIN) 200 MG tablet, Take 200 mg by mouth every 6 (six) hours as needed for moderate pain., Disp: , Rfl:  .  OVER THE COUNTER  MEDICATION, Place 1 application into both nostrils daily as needed (sinus infection)., Disp: , Rfl:  .  OVER THE COUNTER MEDICATION, Xklear eye drops, Disp: , Rfl:   EXAM:  Vitals:   07/18/17 1316  BP: 90/60  Pulse: 83  Temp: 97.8 F (36.6 C)  SpO2: 98%    Body mass index is 24.69 kg/m.  GENERAL: vitals reviewed and listed above, alert, oriented, appears well hydrated and in no acute distress  HEENT: atraumatic, conjunttiva clear, no obvious abnormalities on inspection of external nose and ears, small amount dried blood R ant septum  NECK: no obvious masses on inspection  LUNGS: clear to auscultation bilaterally, no wheezes, rales or rhonchi, good air movement  CV: HRRR, no peripheral edema  SKIN: small 1 cm in diam sub nodule, sl hyperpigmented L bottock  MS: moves all extremities without noticeable abnormality  PSYCH: pleasant and cooperative, no obvious depression or anxiety  ASSESSMENT AND PLAN:  Discussed the following assessment and plan:  Sore in nose  Aortic atherosclerosis (HCC)  Inflamed sebaceous cyst  Acute pain of left shoulder  -glad she is feeling better -advised physical with fasting labs and HM due -compresses for ? Small inflamed cyst, follow up if enlarges or does not resolve -ENT for nose isses -follow up if any concerns in interim -reviewed xray findings -BP baseline today -Patient advised to return or  notify a doctor immediately if symptoms worsen or persist or new concerns arise.  Patient Instructions  BEFORE YOU LEAVE: -follow up: AWV with [name] and CPE with Dr. Maudie Mercury in 3 months - come fasting for fasting labs  Please schedule your mammogram  Please see the ear nose and throat doctor about the nose - please call number provided.  Follow up with your neurologist if headaches worse or dizziness   See orthopedic doctor as planned  Warm compresses for the bump. Follow up if worsens or persists.   Lucretia Kern, DO

## 2017-08-03 ENCOUNTER — Ambulatory Visit (INDEPENDENT_AMBULATORY_CARE_PROVIDER_SITE_OTHER): Payer: Medicare Other | Admitting: Orthopaedic Surgery

## 2017-08-03 ENCOUNTER — Ambulatory Visit (INDEPENDENT_AMBULATORY_CARE_PROVIDER_SITE_OTHER): Payer: Medicare Other

## 2017-08-03 ENCOUNTER — Encounter (INDEPENDENT_AMBULATORY_CARE_PROVIDER_SITE_OTHER): Payer: Self-pay | Admitting: Orthopaedic Surgery

## 2017-08-03 DIAGNOSIS — G8929 Other chronic pain: Secondary | ICD-10-CM | POA: Diagnosis not present

## 2017-08-03 DIAGNOSIS — M25512 Pain in left shoulder: Secondary | ICD-10-CM

## 2017-08-03 DIAGNOSIS — M79672 Pain in left foot: Secondary | ICD-10-CM | POA: Diagnosis not present

## 2017-08-03 NOTE — Progress Notes (Signed)
Office Visit Note   Patient: Brandi Villarreal           Date of Birth: 20-May-1945           MRN: [id number] Visit Date: 08/03/2017              Requested by: Lucretia Kern, DO [place], [place] [address] PCP: Lucretia Kern, DO   Assessment & Plan: Visit Diagnoses:  1. Chronic left shoulder pain   2. Pain in left foot     Plan: In regards to the left shoulder, we we will proceed with a subacromial cortisone injection today.  We will also provide the patient with a prescription for outpatient physical therapy to work on a gentle exercise program.  If she is not any better in the next 4 to 6 weeks, she will call and let us know MRI her left shoulder to assess her rotator cuff.  In regards to her left foot, she does not feels that the pain is bad enough to wear a postoperative shoe or boot for more support.  She does have shoes with a stiff insole and so she will try these out.  She will follow-up with [place] on as-needed basis for her foot.  Call with concerns or questions in the meantime Total face to face encounter time was greater than 45 minutes and over half of this time was spent in counseling and/or coordination of care.  Follow-Up Instructions: Return if symptoms worsen or fail to improve.   Orders:  Orders Placed This Encounter  Procedures  . XR Foot Complete Left  . XR Shoulder Left   No orders of the defined types were placed in this encounter.     Procedures: No procedures performed   Clinical Data: No additional findings.   Subjective: Chief Complaint  Patient presents with  . Left Shoulder - Pain  . Left Foot - Pain    HPI patient is a pleasant 72 year old female presents to our clinic today with left shoulder and left foot pain.  In regards to the left shoulder, this is been bothering her for the past 2 years after a fall landing on the back of her left shoulder.  At that point, she was seen by her primary care provider x-rays were obtained.   These were negative for fracture.  Her pain was initially intermittent until a few weeks ago when it became more constant.  She is not recently noticed weakness as well.  No new injury or change in activity.  Pain she does have is to the deltoid.  Worse with abduction.  She also has pain sleeping on the left side.  No numbness, tingling or burning.  She is tried over-the-counter anti-inflammatories without relief of symptoms.  No previous cortisone injection or surgical intervention to the left shoulder.  In regards to her left foot, she noticed pain a few years ago which never really resolved.  All of her pain was to the midfoot.  This is intermittent in nature and there are no particular aggravators.   Review of Systems as detailed in HPI.  All others are negative.   Objective: Vital Signs: There were no vitals taken for this visit.  Physical Exam well-developed well-nourished female no acute distress.  Alert and oriented x3.  Ortho Exam examination of her left shoulder shows full active range of motion in all planes.  She does have pain past 160 degrees of forward flexion.  She can internally  rotate to L5.  Markedly positive empty can.  Examination of her left foot reveals tenderness to deep palpation early distal third and fourth metatarsals.  Full range of motion.  No swelling.  She is neurovascularly intact distally.  Specialty Comments:  No specialty comments available.  Imaging: Xr Foot Complete Left  Result Date: 08/03/2017 Likely old stress fractures to the third and fourth metatarsal shafts as well as midfoot osteoarthritis  Xr Shoulder Left  Result Date: 08/03/2017 No superior migration of the humeral head.  No other acute findings    PMFS History: Patient Active Problem List   Diagnosis Date Noted  . Chronic left shoulder pain 08/03/2017  . Pain in left foot 08/03/2017  . Aortic atherosclerosis ([place]) 07/18/2017  . Cholelithiasis 03/20/2012  . Osteoporosis 08/09/2006    Past Medical History:  Diagnosis Date  . Cholelithiasis    rare symptoms, declined surgery  . Colitis ?1975  . Hemorrhoids   . Lichen sclerosus    sees gyn for management  . Mitral valve prolapse   . Osteoporosis    sees gyn; refused medication  . Varicose veins     Family History  Problem Relation Age of Onset  . Colon cancer Brother 55  . Pancreatic cancer Brother   . Atrial fibrillation Mother   . Alzheimer's disease Father   . Healthy Sister     Past Surgical History:  Procedure Laterality Date  . jaw resection    . RHINOPLASTY    . TONSILLECTOMY    . TUBAL LIGATION     bilteral   Social History   Occupational History  . Not on file  Tobacco Use  . Smoking status: Never Smoker  . Smokeless tobacco: Never Used  Substance and Sexual Activity  . Alcohol use: No    Comment: wine-rare  . Drug use: No  . Sexual activity: Not on file

## 2017-10-12 DIAGNOSIS — N958 Other specified menopausal and perimenopausal disorders: Secondary | ICD-10-CM | POA: Diagnosis not present

## 2017-10-12 DIAGNOSIS — Z1231 Encounter for screening mammogram for malignant neoplasm of breast: Secondary | ICD-10-CM | POA: Diagnosis not present

## 2017-10-12 DIAGNOSIS — M816 Localized osteoporosis [Lequesne]: Secondary | ICD-10-CM | POA: Diagnosis not present

## 2017-10-20 ENCOUNTER — Encounter: Payer: Self-pay | Admitting: Family Medicine

## 2017-12-21 ENCOUNTER — Telehealth (INDEPENDENT_AMBULATORY_CARE_PROVIDER_SITE_OTHER): Payer: Self-pay | Admitting: Orthopaedic Surgery

## 2017-12-21 NOTE — Telephone Encounter (Signed)
Will bring to you

## 2017-12-21 NOTE — Telephone Encounter (Signed)
Patient called left voicemail message stating that she did not go to (PT) and need another order for (PT) The number to contact patient is [phone]

## 2017-12-21 NOTE — Telephone Encounter (Signed)
See message below. You last saw her in May

## 2017-12-22 NOTE — Telephone Encounter (Signed)
Called patient to let her know no answer. LMOM.  

## 2017-12-22 NOTE — Telephone Encounter (Signed)
Order faxed to [address] location

## 2017-12-25 NOTE — Progress Notes (Signed)
Medicare Annual Preventive Care Visit  (initial annual wellness or annual wellness exam)  Concerns and/or follow up today:  Has a PMH osteoporosis and lichen sclerosis (sees gyn for these issues, [name]), aortic atherosclerosis, mild prediabetes and mild hyperlipidemia.  She has persistent left shoulder pain.  This is intermittent.  Triggered by sleeping on the side or lifting her shoulder above 90 degrees.  She saw an orthopedic specialist about this and he recommended physical therapy.  However she did not do the physical therapy or return the calls from them.  She now needs a re-referral.  No weakness, numbness or radiation of symptoms down the arm.  Pain is in the left lateral shoulder, moderate at times. [facility] Refuses vaccines.  See HM section in Epic for other details of completed HM. See scanned documentation under Media Tab for further documentation HPI, health risk assessment. See Media Tab and Care Teams sections in Epic for other providers.  ROS: negative for report of fevers, unintentional weight loss, vision changes, vision loss, hearing loss or change, chest pain, sob, hemoptysis, melena, hematochezia, hematuria, genital discharge or lesions, falls, bleeding or bruising, loc, thoughts of suicide or self harm, memory loss  1.) Patient-completed health risk assessment  - completed and reviewed, see scanned documentation  2.) Review of Medical History: -PMH, PSH, Family History and current specialty and care providers reviewed and updated and listed below  - see scanned in document in chart and below  Past Medical History:  Diagnosis Date  . Cholelithiasis    rare symptoms, declined surgery  . Colitis ?1975  . Hemorrhoids   . Lichen sclerosus    sees gyn for management  . Mitral valve prolapse   . Osteoporosis    sees gyn; refused medication  . Varicose veins     Past Surgical History:  Procedure Laterality Date  .  jaw resection    . RHINOPLASTY    . TONSILLECTOMY    . TUBAL LIGATION     bilteral    Social History   Socioeconomic History  . Marital status: Married    Spouse name: Not on file  . Number of children: Not on file  . Years of education: Not on file  . Highest education level: Not on file  Occupational History  . Not on file  Social Needs  . Financial resource strain: Not on file  . Food insecurity:    Worry: Not on file    Inability: Not on file  . Transportation needs:    Medical: Not on file    Non-medical: Not on file  Tobacco Use  . Smoking status: Never Smoker  . Smokeless tobacco: Never Used  Substance and Sexual Activity  . Alcohol use: No    Comment: wine-rare  . Drug use: No  . Sexual activity: Not on file  Lifestyle  . Physical activity:    Days per week: Not on file    Minutes per session: Not on file  . Stress: Not on file  Relationships  . Social connections:    Talks on phone: Not on file    Gets together: Not on file    Attends religious service: Not on file    Active member of club or organization: Not on file    Attends meetings of clubs or organizations: Not on file    Relationship status: Not on file  . Intimate partner violence:    Fear of current or  ex partner: Not on file    Emotionally abused: Not on file    Physically abused: Not on file    Forced sexual activity: Not on file  Other Topics Concern  . Not on file  Social History Narrative   Work or School: retired - Merchandiser, retail      Home Situation: lives with husband      Spiritual Beliefs: Christian      Lifestyle: no regular exercise; diet is good    Family History  Problem Relation Age of Onset  . Colon cancer Brother 56  . Pancreatic cancer Brother   . Atrial fibrillation Mother   . Alzheimer's disease Father   . Healthy Sister     Current Outpatient Medications on File Prior to Visit  Medication Sig Dispense Refill  . albuterol (VENTOLIN HFA) 108 (90 Base)  MCG/ACT inhaler Inhale 2 puffs into the lungs every 4 (four) hours as needed. 1 Inhaler 1  . CALCIUM PO Take by mouth.    . Cholecalciferol (VITAMIN D3 PO) Take by mouth.    . Coenzyme Q10 (CO Q-10 PO) Take by mouth.    . Cyanocobalamin (VITAMIN B-12 PO) Take by mouth.    . Homeopathic Products (OSCILLOCOCCINUM PO) Take 1 vial by mouth every 6 (six) hours.    Marland Kitchen ibuprofen (ADVIL,MOTRIN) 200 MG tablet Take 200 mg by mouth every 6 (six) hours as needed for moderate pain.    Marland Kitchen MAGNESIUM PO Take 1,000 mg by mouth.    . Multiple Vitamins-Minerals (ZINC PO) Take by mouth.    Marland Kitchen OVER THE COUNTER MEDICATION Place 1 application into both nostrils daily as needed (sinus infection).    Marland Kitchen OVER THE COUNTER MEDICATION Xclear nose drops    . OVER THE COUNTER MEDICATION CLA    . OVER THE COUNTER MEDICATION Vital reds    . OVER THE COUNTER MEDICATION Enzymes    . OVER THE COUNTER MEDICATION Lutein    . OVER THE COUNTER MEDICATION Turmeric    . OVER THE COUNTER MEDICATION Prebiotic    . Pyridoxine HCl (VITAMIN B-6 PO) Take by mouth.     No current facility-administered medications on file prior to visit.      3.) Review of functional ability and level of safety:  Any difficulty hearing?  See scanned documentation  History of falling?  See scanned documentation  Any trouble with IADLs - using a phone, using transportation, grocery shopping, preparing meals, doing housework, doing laundry, taking medications and managing money?  See scanned documentation  Advance Directives?  Discussed briefly and offered more resources and detailed discussion with our trained staff.   See summary of recommendations in Patient Instructions below.  4.) Physical Exam Vitals:   12/26/17 1324  BP: 102/80  Pulse: 74  Temp: 97.9 F (36.6 C)   Estimated body mass index is 27.03 kg/m as calculated from the following:   Height as of this encounter: 5' 1.5" (1.562 m).   Weight as of this encounter: 145 lb 6.4 oz  (66 kg).  EKG (optional): deferred  General: alert, appear well hydrated and in no acute distress  HEENT: visual acuity grossly intact  CV: HRRR  Lungs: CTA bilaterally  Psych: pleasant and cooperative, no obvious depression or anxiety  Cognitive function grossly intact  MS: No significant tenderness to palpation on exam of the Left  shoulder, she has some pain with impingement testing, she also has a positive shawl sign, otherwise fairly unremarkable exam of the shoulder  neurovascular intact distal.  See patient instructions for recommendations.  Education and counseling regarding the above review of health provided with a plan for the following: -see scanned patient completed form for further details -fall prevention strategies discussed  -healthy lifestyle discussed -importance and resources for completing advanced directives discussed -see patient instructions below for any other recommendations provided  4)The following written screening schedule of preventive measures were reviewed with assessment and plan made per below, orders and patient instructions:      AAA screening done if applicable     Alcohol screening done     Obesity Screening and counseling done     STI screening (Hep C if born 1945-65) offered and per pt wishes     Tobacco Screening done done       Pneumococcal (PPSV23 -one dose after 64, one before if risk factors), influenza yearly and hepatitis B vaccines (if high risk - end stage renal disease, IV drugs, homosexual men, live in home for mentally retarded, hemophilia receiving factors) ASSESSMENT/PLAN: Refuses vaccines      Screening mammograph (yearly if >40) ASSESSMENT/PLAN: utd, [name], GYN, [facility]      Screening Pap smear/pelvic exam (q2 years) ASSESSMENT/PLAN: n/a, declined, does with GYN      Colorectal cancer screening (FOBT yearly or flex sig q4y or colonoscopy q10y or barium enema q4y) ASSESSMENT/PLAN: utd or  ordered      Diabetes outpatient self-management training services ASSESSMENT/PLAN: utd or done      Bone mass measurements(covered q2y if indicated - estrogen def, osteoporosis, hyperparathyroid, vertebral abnormalities, osteoporosis or steroids) ASSESSMENT/PLAN: utd or discussed and ordered per pt wishes      Screening for glaucoma(q1y if high risk - diabetes, FH, AA and > 50 or hispanic and > 65) ASSESSMENT/PLAN: utd or advised      Medical nutritional therapy for individuals with diabetes or renal disease ASSESSMENT/PLAN: see orders      Cardiovascular screening blood tests (lipids q5y) ASSESSMENT/PLAN: see orders and labs      Diabetes screening tests ASSESSMENT/PLAN: see orders and labs   7.) Summary:   Medicare annual wellness visit, subsequent -risk factors and conditions per above assessment were discussed and treatment, recommendations and referrals were offered per documentation above and orders and patient instructions.  Screening for depression -Chart, negative  Chronic left shoulder pain - Plan: Ambulatory referral to Physical Therapy -Referral placed to physical therapy, suspect osteoarthritis of the acromioclavicular joint, also possible rotator cuff pathology, severe findings on exam -Commended follow-up here or with her orthopedic specialist in 1 to 2 months not improving  Patient Instructions   BEFORE YOU LEAVE: -follow up: 1-2 months about the shoulder  Please do the physical therapy for the shoulder. This is what the orthopedic specialist, Dr. Erlinda Hong advised.   Aquaphor for dry skin.  Tiger balm for topical pain control.   Brandi Villarreal , Thank you for taking time to come for your Medicare Wellness Visit. I appreciate your ongoing commitment to your health goals. Please review the following plan we discussed and let me know if I can assist you in the future.   These are the goals we discussed: Goals   Healthy diet Regular aerobic exercise     This  is a list of the screening recommended for you and due dates:  Health Maintenance  Topic Date Due  . Flu Shot  refused  . Pneumonia vaccines (1 of 2 - PCV13) refused  . Stool Blood Test  04/19/2018  .  Mammogram  10/03/2019  . Colon Cancer Screening  05/07/2020  . Tetanus Vaccine  07/27/2026  . DEXA scan (bone density measurement)  Completed  .  Hepatitis C: One time screening is recommended by Center for Disease Control  (CDC) for  adults born from [birth year] through 1965.   Completed  *Topic was postponed. The date shown is not the original due date.      Lucretia Kern, DO

## 2017-12-26 ENCOUNTER — Ambulatory Visit (INDEPENDENT_AMBULATORY_CARE_PROVIDER_SITE_OTHER): Payer: Medicare Other | Admitting: Family Medicine

## 2017-12-26 ENCOUNTER — Encounter: Payer: Self-pay | Admitting: Family Medicine

## 2017-12-26 VITALS — BP 102/80 | HR 74 | Temp 97.9°F | Ht 61.5 in | Wt 145.4 lb

## 2017-12-26 DIAGNOSIS — Z0000 Encounter for general adult medical examination without abnormal findings: Secondary | ICD-10-CM

## 2017-12-26 DIAGNOSIS — G8929 Other chronic pain: Secondary | ICD-10-CM

## 2017-12-26 DIAGNOSIS — Z1331 Encounter for screening for depression: Secondary | ICD-10-CM

## 2017-12-26 DIAGNOSIS — M25512 Pain in left shoulder: Secondary | ICD-10-CM | POA: Diagnosis not present

## 2017-12-26 NOTE — Patient Instructions (Addendum)
BEFORE YOU LEAVE: -follow up: 1-2 months about the shoulder  Please do the physical therapy for the shoulder. This is what the orthopedic specialist, Dr. Erlinda Hong advised.   Aquaphor for dry skin.  Tiger balm for topical pain control.   Brandi Villarreal , Thank you for taking time to come for your Medicare Wellness Visit. I appreciate your ongoing commitment to your health goals. Please review the following plan we discussed and let me know if I can assist you in the future.   These are the goals we discussed: Goals   Healthy diet Regular aerobic exercise     This is a list of the screening recommended for you and due dates:  Health Maintenance  Topic Date Due  . Flu Shot  refused  . Pneumonia vaccines (1 of 2 - PCV13) refused  . Stool Blood Test  04/19/2018  . Mammogram  10/03/2019  . Colon Cancer Screening  05/07/2020  . Tetanus Vaccine  07/27/2026  . DEXA scan (bone density measurement)  Completed  .  Hepatitis C: One time screening is recommended by Center for Disease Control  (CDC) for  adults born from [birth year] through 1965.   Completed  *Topic was postponed. The date shown is not the original due date.

## 2018-01-23 ENCOUNTER — Ambulatory Visit: Payer: Medicare Other | Attending: Family Medicine

## 2018-01-23 ENCOUNTER — Other Ambulatory Visit: Payer: Self-pay

## 2018-01-23 DIAGNOSIS — R293 Abnormal posture: Secondary | ICD-10-CM | POA: Diagnosis not present

## 2018-01-23 DIAGNOSIS — M6281 Muscle weakness (generalized): Secondary | ICD-10-CM | POA: Diagnosis not present

## 2018-01-23 DIAGNOSIS — G8929 Other chronic pain: Secondary | ICD-10-CM | POA: Diagnosis not present

## 2018-01-23 DIAGNOSIS — M25512 Pain in left shoulder: Secondary | ICD-10-CM | POA: Insufficient documentation

## 2018-01-23 NOTE — Therapy (Addendum)
[facility] [address] [place], [place], [address] Phone: [phone]   Fax:  [phone]  Physical Therapy Treatment  Patient Details  Name: Brandi Villarreal MRN: [id number] Date of Birth: Sep 26, 1945 Referring Provider (PT): Colin Benton, MD   Encounter Date: 01/23/2018  PT End of Session - 01/23/18 1228    Visit Number  1    Date for PT Re-Evaluation  03/20/18    Authorization Type  Medicare    PT Start Time  1147    PT Stop Time  1226    PT Time Calculation (min)  39 min    Activity Tolerance  Patient tolerated treatment well    Behavior During Therapy  [facility] for tasks assessed/performed       Past Medical History:  Diagnosis Date  . Cholelithiasis    rare symptoms, declined surgery  . Colitis ?1975  . Hemorrhoids   . Lichen sclerosus    sees gyn for management  . Mitral valve prolapse   . Osteoporosis    sees gyn; refused medication  . Varicose veins     Past Surgical History:  Procedure Laterality Date  . jaw resection    . RHINOPLASTY    . TONSILLECTOMY    . TUBAL LIGATION     bilteral    There were no vitals filed for this visit.  Subjective Assessment - 01/23/18 1156    Subjective  Pt presents to PT with Lt shoulder pain that is chronic.  Pt reports that she had a fall with injury to the Lt shoulder 2 years ago.  Pt was unable to raise her shoulder and had stopped using the arm for a period of time.  Pt is now using the Lt UE but still having pain.      Pertinent History  osteoporosis    Diagnostic tests  x-ray: unable to view.  Pt reports "chronic rotator cuff"    Patient Stated Goals  improve strength, use and reduce pain of Lt shoulder    Currently in Pain?  Yes    Pain Score  0-No pain   up to 4/10   Pain Location  Shoulder    Pain Orientation  Left    Pain Descriptors / Indicators  Sharp    Pain Type  Chronic pain    Pain Onset  More than a month ago    Pain Frequency   Intermittent    Aggravating Factors   reaching into back, doing hair    Pain Relieving Factors  ice/heat, rest         [facility] PT Assessment - 01/23/18 0001      Assessment   Medical Diagnosis  chronic Lt shoulder pain    Referring Provider (PT)  Colin Benton, MD    Onset Date/Surgical Date  01/24/16    Hand Dominance  Right    Next MD Visit  none    Prior Therapy  none      Precautions   Precautions  None      Restrictions   Weight Bearing Restrictions  No      Balance Screen   Has the patient fallen in the past 6 months  Yes    How many times?  1   fell up the steps-holding too much   Has the patient had a decrease in activity level because of a fear of falling?   No    Is the patient reluctant to leave their home because  of a fear of falling?   No      Prior Function   Level of Independence  Independent    Vocation  Retired    Leisure  exercise, Scientific laboratory technician   Overall Cognitive Status  Within Functional Limits for tasks assessed      Observation/Other Assessments   Focus on Therapeutic Outcomes (FOTO)   36% limitation      Posture/Postural Control   Posture/Postural Control  Postural limitations    Postural Limitations  Rounded Shoulders      ROM / Strength   AROM / PROM / Strength  AROM;PROM;Strength      AROM   Overall AROM   Deficits    Overall AROM Comments  Lt=Rt shoulder A/ROM with Lt shoulder pain reported at end range and scapular elevation with forward flexion on the Lt      PROM   Overall PROM   Within functional limits for tasks performed      Strength   Overall Strength  Deficits    Overall Strength Comments  Rt shoulder 4-5, Lt shoulder 3+/5      Palpation   Palpation comment  palpable tenderness over Lt anterior glenohumeral joint and proximal biceps tendon insertion and along Lt medial scapular border.  Reduced inferior glide of Lt glenohumeral joint      Transfers   Transfers  Independent with all Transfers       Ambulation/Gait   Gait Pattern  Within Functional Limits                           PT Education - 01/23/18 1222    Education Details   Access Code: [email or web address]     Person(s) Educated  Patient    Methods  Explanation;Demonstration;Handout    Comprehension  Verbalized understanding;Returned demonstration       PT Short Term Goals - 01/23/18 1153      PT SHORT TERM GOAL #1   Title  be indepenent in initial HEP    Time  4    Period  Weeks    Status  New    Target Date  02/20/18      PT SHORT TERM GOAL #2   Title  report a 30% reduction in Lt shoulder pain with reaching back to put on a jacket or reach an object on the back seat    Time  4    Period  Weeks    Status  New    Target Date  02/20/18      PT SHORT TERM GOAL #3   Title  demonstrate 4-/5 Lt shoulder strength to improve endurance for repetative tasks and overhead use    Time  4    Period  Weeks    Status  New    Target Date  02/20/18        PT Long Term Goals - 01/23/18 1154      PT LONG TERM GOAL #1   Title  be independent in advanced HEP    Time  8    Period  Weeks    Status  New    Target Date  03/20/18      PT LONG TERM GOAL #2   Title  reduce FOTO to < or = to 30% limitation    Time  8    Period  Weeks    Status  New    Target Date  03/20/18      PT LONG TERM GOAL #3   Title  demonstrated 4/5 Lt shoulder strength to improve endurance for overhead use and repetative use    Time  8    Period  Weeks    Status  New    Target Date  03/20/18      PT LONG TERM GOAL #4   Title  report a 70% reduction in Lt shoulder strength with reaching behind to put on a jacket or reach an object in the back seat    Time  8    Period  Weeks    Status  New    Target Date  03/20/18      PT LONG TERM GOAL #5   Title  reach overhead with Lt UE without an increase in pain    Time  8    Period  Weeks    Status  New    Target Date  03/20/18            Plan - 01/23/18 1238    Clinical  Impression Statement  Pt is a Rt hand dominant female who presents to PT with chronic Lt shoulder pain.  Pt had a fall 2 years ago and injured the Lt shoulder and began to limit the use of the Lt UE due to pain.  Pain is less now but use is still limited and pt has intermittent pain.  Pt demonstrates scapular protraction, decreased Lt shoulder strength and pain with A/ROM of the Lt shoulder.  Pt reports up to 3/10 pain with reaching back to put on a jacket or reach into the back seat.  Pt will benefit from skilled PT for postural strength progression, Lt rotator cuff strength and manual/modalities to reduce pain as needed to allow for return to normal use of the Lt UE with daily tasks.      History and Personal Factors relevant to plan of care:  osteoporosis    Clinical Presentation  Stable    Clinical Presentation due to:  2 year history of Lt shoulder pain-improving    Clinical Decision Making  Low    Rehab Potential  Excellent    PT Frequency  1x / week    PT Duration  8 weeks    PT Treatment/Interventions  ADLs/Self Care Home Management;Cryotherapy;Electrical Stimulation;Iontophoresis 35m/ml Dexamethasone;Moist Heat;Functional mobility training;Therapeutic activities;Therapeutic exercise;Patient/family education;Neuromuscular re-education;Manual techniques;Passive range of motion;Taping;Dry needling    PT Next Visit Plan  Lt shoulder strength, postural strength, Lt shoulder use against gravity without scapular elevation,     PT Home Exercise Plan  Access Code: [email or web address]     Consulted and Agree with Plan of Care  Patient       Patient will benefit from skilled therapeutic intervention in order to improve the following deficits and impairments:  Decreased activity tolerance, Decreased strength, Impaired UE functional use, Postural dysfunction, Pain  Visit Diagnosis: Chronic left shoulder pain - Plan: PT plan of care cert/re-cert  Muscle weakness (generalized) - Plan: PT plan of care  cert/re-cert  Abnormal posture - Plan: PT plan of care cert/re-cert     Problem List Patient Active Problem List   Diagnosis Date Noted  . Chronic left shoulder pain 08/03/2017  . [place] 07/18/[place]  . Cholelithiasis 03/20/2012  . Osteoporosis 08/09/2006    KSigurd Sos PT 01/23/18 12:44 PM PHYSICAL THERAPY DISCHARGE SUMMARY  Visits from Start of Care: 1  Current functional level related to goals / functional outcomes: Pt called  after initial evaluation and canceled all remaining appointments.     Remaining deficits: See above for most current status.     Education / Equipment: HEP Plan: Patient agrees to discharge.  Patient goals were not met. Patient is being discharged due to the patient's request.  ?????        Sigurd Sos, PT 03/21/18 12:11 PM  [facility] [address] [place], [place], [address] Phone: [phone]   Fax:  [phone]  Name: Brandi Villarreal MRN: [id number] Date of Birth: [birth year]

## 2018-01-23 NOTE — Patient Instructions (Signed)
Access Code: [email or web address]  URL: [email or web address]  Date: 01/23/2018  Prepared by: Sigurd Sos   Exercises  Seated Shoulder Abduction - Palms Down - 10 reps - 2 sets - 2x daily - 7x weekly  Seated Shoulder Flexion - 10 reps - 2 sets - 2x daily - 7x weekly  Seated Shoulder Scaption - 10 reps - 2 sets - 2x daily - 7x weekly  Shoulder Flexion Wall Slide with Towel - 10 reps - 3 sets - 5 hold - 2x daily - 7x weekly

## 2018-02-02 ENCOUNTER — Encounter

## 2018-02-06 ENCOUNTER — Encounter: Payer: Medicare Other | Admitting: Physical Therapy

## 2018-02-15 ENCOUNTER — Encounter: Payer: Medicare Other | Admitting: Physical Therapy

## 2018-04-18 ENCOUNTER — Ambulatory Visit (INDEPENDENT_AMBULATORY_CARE_PROVIDER_SITE_OTHER): Payer: Medicare Other | Admitting: Family Medicine

## 2018-04-18 ENCOUNTER — Encounter: Payer: Self-pay | Admitting: Family Medicine

## 2018-04-18 VITALS — BP 104/62 | HR 76 | Temp 97.8°F | Wt 147.4 lb

## 2018-04-18 DIAGNOSIS — J209 Acute bronchitis, unspecified: Secondary | ICD-10-CM | POA: Diagnosis not present

## 2018-04-18 MED ORDER — AZITHROMYCIN 250 MG PO TABS: ORAL_TABLET | ORAL | 0 refills | Status: DC

## 2018-04-18 NOTE — Progress Notes (Signed)
   Subjective:    Patient ID: Brandi Villarreal, female    DOB: Dec 24, 1945, 73 y.o.   MRN: [id number]  HPI Here for 2 weeks of stuffy head, PND, chest tightness and coughing up yellow sputum. No fever.    Review of Systems  Constitutional: Negative.   HENT: Positive for congestion and postnasal drip. Negative for sinus pressure, sinus pain and sore throat.   Eyes: Negative.   Respiratory: Positive for cough and chest tightness.        Objective:   Physical Exam Constitutional:      Appearance: She is ill-appearing.  HENT:     Right Ear: Tympanic membrane and ear canal normal.     Left Ear: Tympanic membrane and ear canal normal.     Nose: Nose normal.     Mouth/Throat:     Pharynx: Oropharynx is clear.  Eyes:     Conjunctiva/sclera: Conjunctivae normal.  Pulmonary:     Effort: Pulmonary effort is normal. No respiratory distress.     Breath sounds: No wheezing or rales.     Comments: Scattered rhonchi  Lymphadenopathy:     Cervical: No cervical adenopathy.  Neurological:     Mental Status: She is alert.           Assessment & Plan:  Bronchitis, treat with a Zpack. Add Mucinex and Delsym prn.  Alysia Penna, MD

## 2018-05-02 DIAGNOSIS — D225 Melanocytic nevi of trunk: Secondary | ICD-10-CM | POA: Diagnosis not present

## 2018-05-02 DIAGNOSIS — L900 Lichen sclerosus et atrophicus: Secondary | ICD-10-CM | POA: Diagnosis not present

## 2018-05-02 DIAGNOSIS — L821 Other seborrheic keratosis: Secondary | ICD-10-CM | POA: Diagnosis not present

## 2018-05-02 DIAGNOSIS — L820 Inflamed seborrheic keratosis: Secondary | ICD-10-CM | POA: Diagnosis not present

## 2018-05-21 DIAGNOSIS — H40013 Open angle with borderline findings, low risk, bilateral: Secondary | ICD-10-CM | POA: Diagnosis not present

## 2018-05-28 LAB — FECAL OCCULT BLOOD, GUAIAC: Fecal Occult Blood: NEGATIVE

## 2018-07-10 ENCOUNTER — Encounter: Payer: Self-pay | Admitting: Family Medicine

## 2019-01-02 DIAGNOSIS — L298 Other pruritus: Secondary | ICD-10-CM | POA: Diagnosis not present

## 2019-01-02 DIAGNOSIS — D1801 Hemangioma of skin and subcutaneous tissue: Secondary | ICD-10-CM | POA: Diagnosis not present

## 2019-01-02 DIAGNOSIS — L820 Inflamed seborrheic keratosis: Secondary | ICD-10-CM | POA: Diagnosis not present

## 2019-01-02 DIAGNOSIS — L821 Other seborrheic keratosis: Secondary | ICD-10-CM | POA: Diagnosis not present

## 2019-01-02 DIAGNOSIS — L900 Lichen sclerosus et atrophicus: Secondary | ICD-10-CM | POA: Diagnosis not present

## 2019-01-07 DIAGNOSIS — Z1231 Encounter for screening mammogram for malignant neoplasm of breast: Secondary | ICD-10-CM | POA: Diagnosis not present

## 2019-02-13 DIAGNOSIS — Z13220 Encounter for screening for lipoid disorders: Secondary | ICD-10-CM | POA: Diagnosis not present

## 2019-02-13 DIAGNOSIS — R7989 Other specified abnormal findings of blood chemistry: Secondary | ICD-10-CM | POA: Diagnosis not present

## 2019-02-13 DIAGNOSIS — Z1329 Encounter for screening for other suspected endocrine disorder: Secondary | ICD-10-CM | POA: Diagnosis not present

## 2019-02-13 DIAGNOSIS — L900 Lichen sclerosus et atrophicus: Secondary | ICD-10-CM | POA: Diagnosis not present

## 2019-02-13 DIAGNOSIS — Z1321 Encounter for screening for nutritional disorder: Secondary | ICD-10-CM | POA: Diagnosis not present

## 2019-02-13 DIAGNOSIS — E559 Vitamin D deficiency, unspecified: Secondary | ICD-10-CM | POA: Diagnosis not present

## 2019-02-13 DIAGNOSIS — Z13228 Encounter for screening for other metabolic disorders: Secondary | ICD-10-CM | POA: Diagnosis not present

## 2019-05-02 ENCOUNTER — Other Ambulatory Visit: Payer: Self-pay

## 2019-05-02 ENCOUNTER — Telehealth (INDEPENDENT_AMBULATORY_CARE_PROVIDER_SITE_OTHER): Payer: Medicare Other | Admitting: Family Medicine

## 2019-05-02 DIAGNOSIS — M25521 Pain in right elbow: Secondary | ICD-10-CM

## 2019-05-02 DIAGNOSIS — M25561 Pain in right knee: Secondary | ICD-10-CM

## 2019-05-02 DIAGNOSIS — Z7189 Other specified counseling: Secondary | ICD-10-CM

## 2019-05-02 DIAGNOSIS — M25469 Effusion, unspecified knee: Secondary | ICD-10-CM | POA: Diagnosis not present

## 2019-05-02 NOTE — Progress Notes (Signed)
Virtual Visit via Video Note  I connected with Brandi Villarreal   on 05/02/19 at 11:00 AM EST by a video enabled telemedicine application and verified that I am speaking with the correct person using two identifiers.  Location patient: home Location provider:work or home office Persons participating in the virtual visit: patient, provider, husband  I discussed the limitations of evaluation and management by telemedicine and the availability of in person appointments. The patient expressed understanding and agreed to proceed.   HPI:  Acute visit for knee issues -symptoms include: felt like had swelling around knee caps of bilat legs a few days ago - resolved with a heat pack (did not have redness or warmth), achy knees and swelling around R knee yesterday -went for a walk yesterday - did 7500 steps yesterday and feels fine today -today no swelling, pain or vein issues or pain -has a history of varicose veins over knees from P90x in the past - these are not red, painful or swollen -no fevers, SOB, cough, sickness, tick bites, post leg pain or tenderness, leg swelling otherwise,sick contacts -wt 149.5 -T98.6 -has had some elbow pain for sometime, but only if rests elbow on certain spot on table - denies swelling or redness here -denies exposures outside the home other than daughters whom wear masks and are isolated   ROS: See pertinent positives and negatives per HPI.  Past Medical History:  Diagnosis Date  . Cholelithiasis    rare symptoms, declined surgery  . Colitis ?1975  . Hemorrhoids   . Lichen sclerosus    sees gyn for management  . Mitral valve prolapse   . Osteoporosis    sees gyn; refused medication  . Varicose veins     Past Surgical History:  Procedure Laterality Date  . jaw resection    . RHINOPLASTY    . TONSILLECTOMY    . TUBAL LIGATION     bilteral    Family History  Problem Relation Age of Onset  . Colon cancer Brother 47  . Pancreatic cancer Brother   .  Atrial fibrillation Mother   . Alzheimer's disease Father   . Healthy Sister     SOCIAL HX:see hpi   Current Outpatient Medications:  .  albuterol (VENTOLIN HFA) 108 (90 Base) MCG/ACT inhaler, Inhale 2 puffs into the lungs every 4 (four) hours as needed., Disp: 1 Inhaler, Rfl: 1 .  azithromycin (ZITHROMAX Z-PAK) 250 MG tablet, As directed, Disp: 6 each, Rfl: 0 .  CALCIUM PO, Take by mouth., Disp: , Rfl:  .  Cholecalciferol (VITAMIN D3 PO), Take by mouth., Disp: , Rfl:  .  Coenzyme Q10 (CO Q-10 PO), Take by mouth., Disp: , Rfl:  .  Cyanocobalamin (VITAMIN B-12 PO), Take by mouth., Disp: , Rfl:  .  Homeopathic Products (OSCILLOCOCCINUM PO), Take 1 vial by mouth every 6 (six) hours., Disp: , Rfl:  .  ibuprofen (ADVIL,MOTRIN) 200 MG tablet, Take 200 mg by mouth every 6 (six) hours as needed for moderate pain., Disp: , Rfl:  .  MAGNESIUM PO, Take 1,000 mg by mouth., Disp: , Rfl:  .  Multiple Vitamins-Minerals (ZINC PO), Take by mouth., Disp: , Rfl:  .  OVER THE COUNTER MEDICATION, Place 1 application into both nostrils daily as needed (sinus infection)., Disp: , Rfl:  .  OVER THE COUNTER MEDICATION, Xclear nose drops, Disp: , Rfl:  .  OVER THE COUNTER MEDICATION, CLA, Disp: , Rfl:  .  OVER THE COUNTER MEDICATION, Vital reds, Disp: , Rfl:  .  OVER THE COUNTER MEDICATION, Enzymes, Disp: , Rfl:  .  OVER THE COUNTER MEDICATION, Lutein, Disp: , Rfl:  .  OVER THE COUNTER MEDICATION, Turmeric, Disp: , Rfl:  .  OVER THE COUNTER MEDICATION, Prebiotic, Disp: , Rfl:  .  Pyridoxine HCl (VITAMIN B-6 PO), Take by mouth., Disp: , Rfl:   EXAM:  VITALS per patient if applicable:  GENERAL: alert, oriented, appears well and in no acute distress  HEENT: atraumatic, conjunttiva clear, no obvious abnormalities on inspection of external nose and ears  NECK: normal movements of the head and neck  LUNGS: on inspection no signs of respiratory distress, breathing rate appears normal, no obvious gross SOB,  gasping or wheezing  CV: no obvious cyanosis  MS: moves all visible extremities without noticeable abnormality, limited inspection of knees legs with video visit - ? Minimal swelling around R knee cap, she does not appreciate leg swelling with pressing over shins, she denies any pain to TTP over deep veins or varicose veins, denies redness or warmth, she is able to do single leg squat, ? Mild J sins bilat knees; elbow appears normal on gross exam with good ROM  PSYCH/NEURO: pleasant and cooperative, no obvious depression or anxiety, speech and thought processing grossly intact  ASSESSMENT AND PLAN:  Discussed the following assessment and plan:  Acute pain of right knee  Swelling of knee  Right elbow pain  Other specified counseling  -we discussed possible serious and likely etiologies, options for evaluation and workup, limitations of telemedicine visit vs in person visit, treatment, treatment risks and precautions. Pt prefers to treat via telemedicine empirically rather then risking or undertaking an in person visit at this moment. Discussed various causes for swelling, pain around knee caps and suspect OA or mild PFS may be the issues given symptoms resolved with heat. No symptoms today. Opted for observation, prn nsaid, heat if needed and will have assistant send PFS exercises. Do not think elbow is related, but opted to follow up in 2 weeks to recheck. She has hx RTC issues with L shoulder and will resend HEP for this per her request - reports occ symptoms there. Spent some time counseling on preventive strategies for COVID19 during the pandemic, staying home when able, masks and social distancing, etc. AWV with follow up in 2 week. Will need in office PCP and prefers Dr. Ethlyn Gallery - agrees to establish at some point.  Sent message to schedulers to assist. Sent message to assistant for HEPs. Patient agrees to seek prompt in person care if worsening, new symptoms arise, or if is not improving  with treatment.   I discussed the assessment and treatment plan with the patient. The patient was provided an opportunity to ask questions and all were answered. The patient agreed with the plan and demonstrated an understanding of the instructions.   The patient was advised to call back or seek an in-person evaluation if the symptoms worsen or if the condition fails to improve as anticipated.   Lucretia Kern, DO

## 2019-05-02 NOTE — Progress Notes (Signed)
Exercises and tubing were mailed to the home address.

## 2019-05-07 ENCOUNTER — Telehealth: Payer: Medicare Other | Admitting: Family Medicine

## 2019-05-16 ENCOUNTER — Other Ambulatory Visit: Payer: Self-pay

## 2019-05-16 ENCOUNTER — Telehealth (INDEPENDENT_AMBULATORY_CARE_PROVIDER_SITE_OTHER): Payer: Medicare Other | Admitting: Family Medicine

## 2019-05-16 ENCOUNTER — Encounter: Payer: Self-pay | Admitting: Family Medicine

## 2019-05-16 DIAGNOSIS — K5900 Constipation, unspecified: Secondary | ICD-10-CM

## 2019-05-16 DIAGNOSIS — M25561 Pain in right knee: Secondary | ICD-10-CM | POA: Diagnosis not present

## 2019-05-16 DIAGNOSIS — M25562 Pain in left knee: Secondary | ICD-10-CM

## 2019-05-16 DIAGNOSIS — R109 Unspecified abdominal pain: Secondary | ICD-10-CM

## 2019-05-16 DIAGNOSIS — M25559 Pain in unspecified hip: Secondary | ICD-10-CM

## 2019-05-16 DIAGNOSIS — K30 Functional dyspepsia: Secondary | ICD-10-CM | POA: Diagnosis not present

## 2019-05-16 NOTE — Progress Notes (Signed)
Virtual Visit via Video Note  I connected with Brandi Villarreal  on 05/16/19 at 10:20 AM EST by a video enabled telemedicine application and verified that I am speaking with the correct person using two identifiers.  Location patient: home Location provider:work or home office Persons participating in the virtual visit: patient, provider  I discussed the limitations of evaluation and management by telemedicine and the availability of in person appointments. The patient expressed understanding and agreed to proceed.   HPI:  Follow up knee pain: -see prior note 1/28 - reviewed -this resolved and is doing much better -[place] and [place]  Has had some hip pain: -lateral hips occ -reports saw orthopedic specialist in the past and told was fine, given oral NSAID prn -sharp, wonders what to take or if should get refill on nsaid from ortho  Indigestion: -chronic -occ pain in L upper Q and some belching, gas after meals -was on probiotic in the past and that helped, has not worsened or changed -has had some mild constipation at times   ROS: See pertinent positives and negatives per HPI.  Past Medical History:  Diagnosis Date  . Cholelithiasis    rare symptoms, declined surgery  . Colitis ?1975  . Hemorrhoids   . Lichen sclerosus    sees gyn for management  . Mitral valve prolapse   . Osteoporosis    sees gyn; refused medication  . Varicose veins     Past Surgical History:  Procedure Laterality Date  . jaw resection    . RHINOPLASTY    . TONSILLECTOMY    . TUBAL LIGATION     bilteral    Family History  Problem Relation Age of Onset  . Colon cancer Brother 73  . Pancreatic cancer Brother   . Atrial fibrillation Mother   . Alzheimer's disease Father   . Healthy Sister     SOCIAL HX: see hpi   Current Outpatient Medications:  .  albuterol (VENTOLIN HFA) 108 (90 Base) MCG/ACT inhaler, Inhale 2 puffs into the lungs every 4 (four) hours as needed.,  Disp: 1 Inhaler, Rfl: 1 .  alendronate (FOSAMAX) 70 MG tablet, alendronate 70 mg tablet, Disp: , Rfl:  .  CALCIUM PO, Take by mouth., Disp: , Rfl:  .  Cholecalciferol (VITAMIN D3 PO), Take by mouth., Disp: , Rfl:  .  Coenzyme Q10 (CO Q-10 PO), Take by mouth., Disp: , Rfl:  .  Cyanocobalamin (VITAMIN B-12 PO), Take by mouth., Disp: , Rfl:  .  diphenhydrAMINE HCl (BENADRYL PO), Take by mouth as needed., Disp: , Rfl:  .  ibuprofen (ADVIL,MOTRIN) 200 MG tablet, Take 200 mg by mouth every 6 (six) hours as needed for moderate pain., Disp: , Rfl:  .  MAGNESIUM PO, Take 1,000 mg by mouth., Disp: , Rfl:  .  MELATONIN PO, Take by mouth., Disp: , Rfl:  .  Multiple Vitamins-Minerals (ZINC PO), Take by mouth., Disp: , Rfl:  .  OVER THE COUNTER MEDICATION, Xclear nose drops, Disp: , Rfl:  .  OVER THE COUNTER MEDICATION, CLA, Disp: , Rfl:  .  OVER THE COUNTER MEDICATION, Vital reds, Disp: , Rfl:  .  OVER THE COUNTER MEDICATION, Enzymes, Disp: , Rfl:  .  OVER THE COUNTER MEDICATION, Lutein, Disp: , Rfl:  .  OVER THE COUNTER MEDICATION, Turmeric, Disp: , Rfl:  .  OVER THE COUNTER MEDICATION, Prebiotic, Disp: , Rfl:  .  Pyridoxine HCl (VITAMIN B-6 PO), Take by mouth., Disp: ,  Rfl:   EXAM:  VITALS per patient if applicable:  GENERAL: alert, oriented, appears well and in no acute distress  HEENT: atraumatic, conjunttiva clear, no obvious abnormalities on inspection of external nose and ears  NECK: normal movements of the head and neck  LUNGS: on inspection no signs of respiratory distress, breathing rate appears normal, no obvious gross SOB, gasping or wheezing  CV: no obvious cyanosis  ABD: point to LUQ as area where sometimes has pain  MS: moves all visible extremities without noticeable abnormality, points to lateral hips as area of her hip issues  PSYCH/NEURO: pleasant and cooperative, no obvious depression or anxiety, speech and thought processing grossly intact  ASSESSMENT AND  PLAN:  Discussed the following assessment and plan:  Acute pain of both knees  Hip pain  Indigestion  Constipation, unspecified constipation type  Abdominal pain, unspecified abdominal location  -we discussed possible serious and likely etiologies, options for evaluation and workup, limitations of telemedicine visit vs in person visit, treatment, treatment risks and precautions. Glad knees are better and suspect possible OA. Glad she is able to stay healthy. Hip pain may be IT band or bursitis - advised ice, prn analgesic, IT band stretching and evaluation inperson with PCP/UC/etc if worsening or not improving. She needs PCP and agrees to call and set up appt. In interim for abd pain can try restarting probiotic, fiber supplement and in person care if worsening, new symptoms arise, or if is not improving with treatment.   I discussed the assessment and treatment plan with the patient. The patient was provided an opportunity to ask questions and all were answered. The patient agreed with the plan and demonstrated an understanding of the instructions.   The patient was advised to call back or seek an in-person evaluation if the symptoms worsen or if the condition fails to improve as anticipated.   Brandi Kern, DO

## 2019-05-22 DIAGNOSIS — H35372 Puckering of macula, left eye: Secondary | ICD-10-CM | POA: Diagnosis not present

## 2019-05-26 ENCOUNTER — Ambulatory Visit: Payer: Medicare Other | Attending: Internal Medicine

## 2019-05-26 DIAGNOSIS — Z23 Encounter for immunization: Secondary | ICD-10-CM | POA: Insufficient documentation

## 2019-05-26 NOTE — Progress Notes (Signed)
   Covid-19 Vaccination Clinic  Name:  Brandi Villarreal    MRN: [id number] DOB: 1945-10-04  05/26/2019  [name] was observed post Covid-19 immunization for 15 minutes without incidence. She was provided with Vaccine Information Sheet and instruction to access the V-Safe system.   [name] was instructed to call 911 with any severe reactions post vaccine: Marland Kitchen Difficulty breathing  . Swelling of your face and throat  . A fast heartbeat  . A bad rash all over your body  . Dizziness and weakness    Immunizations Administered    Name Date Dose VIS Date Route   Pfizer COVID-19 Vaccine 05/26/2019 10:01 AM 0.3 mL 03/15/2019 Intramuscular   Manufacturer: [place]   Lot: [id number]   [place]: [id number]

## 2019-06-25 ENCOUNTER — Ambulatory Visit: Payer: Medicare Other | Attending: Internal Medicine

## 2019-06-25 DIAGNOSIS — Z23 Encounter for immunization: Secondary | ICD-10-CM

## 2019-06-25 NOTE — Progress Notes (Signed)
   Covid-19 Vaccination Clinic  Name:  Brandi Villarreal    MRN: [id number] DOB: 1946-01-04  06/25/2019  [name] was observed post Covid-19 immunization for 15 minutes without incident. She was provided with Vaccine Information Sheet and instruction to access the V-Safe system.   [name] was instructed to call 911 with any severe reactions post vaccine: Marland Kitchen Difficulty breathing  . Swelling of face and throat  . A fast heartbeat  . A bad rash all over body  . Dizziness and weakness   Immunizations Administered    Name Date Dose VIS Date Route   Pfizer COVID-19 Vaccine 06/25/2019  9:56 AM 0.3 mL 03/15/2019 Intramuscular   Manufacturer: [place]   Lot: [id number]   [place]: [id number]

## 2019-07-16 DIAGNOSIS — M79644 Pain in right finger(s): Secondary | ICD-10-CM | POA: Diagnosis not present

## 2019-08-08 ENCOUNTER — Other Ambulatory Visit: Payer: Self-pay

## 2019-08-08 ENCOUNTER — Ambulatory Visit (INDEPENDENT_AMBULATORY_CARE_PROVIDER_SITE_OTHER): Payer: Medicare Other | Admitting: Internal Medicine

## 2019-08-08 ENCOUNTER — Encounter: Payer: Self-pay | Admitting: Internal Medicine

## 2019-08-08 DIAGNOSIS — I700 Atherosclerosis of aorta: Secondary | ICD-10-CM

## 2019-08-08 NOTE — Progress Notes (Signed)
   Subjective:    Patient ID: Brandi Villarreal, female    DOB: 13-Nov-1945, 74 y.o.   MRN: [id number]  HPI Review of Systems     Objective:   Physical Exam        Assessment & Plan:

## 2019-08-08 NOTE — Patient Instructions (Signed)
none

## 2019-08-09 ENCOUNTER — Ambulatory Visit: Payer: Medicare Other | Admitting: Family Medicine

## 2019-08-16 ENCOUNTER — Other Ambulatory Visit: Payer: Self-pay

## 2019-08-19 ENCOUNTER — Encounter: Payer: Self-pay | Admitting: Family Medicine

## 2019-08-19 ENCOUNTER — Ambulatory Visit (INDEPENDENT_AMBULATORY_CARE_PROVIDER_SITE_OTHER): Payer: Medicare Other | Admitting: Family Medicine

## 2019-08-19 ENCOUNTER — Other Ambulatory Visit: Payer: Self-pay

## 2019-08-19 VITALS — BP 112/80 | HR 74 | Temp 97.8°F | Ht 61.5 in | Wt 150.9 lb

## 2019-08-19 DIAGNOSIS — Z13220 Encounter for screening for lipoid disorders: Secondary | ICD-10-CM

## 2019-08-19 DIAGNOSIS — E785 Hyperlipidemia, unspecified: Secondary | ICD-10-CM | POA: Diagnosis not present

## 2019-08-19 DIAGNOSIS — B399 Histoplasmosis, unspecified: Secondary | ICD-10-CM

## 2019-08-19 DIAGNOSIS — M810 Age-related osteoporosis without current pathological fracture: Secondary | ICD-10-CM

## 2019-08-19 DIAGNOSIS — Z1211 Encounter for screening for malignant neoplasm of colon: Secondary | ICD-10-CM

## 2019-08-19 DIAGNOSIS — R7301 Impaired fasting glucose: Secondary | ICD-10-CM

## 2019-08-19 DIAGNOSIS — Z862 Personal history of diseases of the blood and blood-forming organs and certain disorders involving the immune mechanism: Secondary | ICD-10-CM | POA: Diagnosis not present

## 2019-08-19 DIAGNOSIS — E538 Deficiency of other specified B group vitamins: Secondary | ICD-10-CM | POA: Diagnosis not present

## 2019-08-19 DIAGNOSIS — R42 Dizziness and giddiness: Secondary | ICD-10-CM

## 2019-08-19 LAB — CBC WITH DIFFERENTIAL/PLATELET
Basophils Absolute: 0 10*3/uL (ref 0.0–0.1)
Basophils Relative: 0.7 % (ref 0.0–3.0)
Eosinophils Absolute: 0.2 10*3/uL (ref 0.0–0.7)
Eosinophils Relative: 2.8 % (ref 0.0–5.0)
HCT: 43.9 % (ref 36.0–46.0)
Hemoglobin: 14.7 g/dL (ref 12.0–15.0)
Lymphocytes Relative: 26.7 % (ref 12.0–46.0)
Lymphs Abs: 1.5 10*3/uL (ref 0.7–4.0)
MCHC: 33.5 g/dL (ref 30.0–36.0)
MCV: 93.1 fl (ref 78.0–100.0)
Monocytes Absolute: 0.6 10*3/uL (ref 0.1–1.0)
Monocytes Relative: 11.2 % (ref 3.0–12.0)
Neutro Abs: 3.3 10*3/uL (ref 1.4–7.7)
Neutrophils Relative %: 58.6 % (ref 43.0–77.0)
Platelets: 218 10*3/uL (ref 150.0–400.0)
RBC: 4.71 Mil/uL (ref 3.87–5.11)
RDW: 13.3 % (ref 11.5–15.5)
WBC: 5.7 10*3/uL (ref 4.0–10.5)

## 2019-08-19 LAB — LIPID PANEL
Cholesterol: 145 mg/dL (ref 0–200)
HDL: 49.8 mg/dL (ref >39.00)
LDL Cholesterol: 81 mg/dL (ref 0–99)
NonHDL: 95.56
Total CHOL/HDL Ratio: 3
Triglycerides: 75 mg/dL (ref 0.0–149.0)
VLDL: 15 mg/dL (ref 0.0–40.0)

## 2019-08-19 LAB — COMPREHENSIVE METABOLIC PANEL
ALT: 13 U/L (ref 0–35)
AST: 19 U/L (ref 0–37)
Albumin: 4.4 g/dL (ref 3.5–5.2)
Alkaline Phosphatase: 80 U/L (ref 39–117)
BUN: 18 mg/dL (ref 6–23)
CO2: 28 mEq/L (ref 19–32)
Calcium: 9.1 mg/dL (ref 8.4–10.5)
Chloride: 105 mEq/L (ref 96–112)
Creatinine, Ser: 0.74 mg/dL (ref 0.40–1.20)
GFR: 76.81 mL/min (ref >60.00)
Glucose, Bld: 92 mg/dL (ref 70–99)
Potassium: 4.5 mEq/L (ref 3.5–5.1)
Sodium: 139 mEq/L (ref 135–145)
Total Bilirubin: 0.6 mg/dL (ref 0.2–1.2)
Total Protein: 6.9 g/dL (ref 6.0–8.3)

## 2019-08-19 LAB — HEMOGLOBIN A1C: Hgb A1c MFr Bld: 5.5 % (ref 4.6–6.5)

## 2019-08-19 NOTE — Progress Notes (Signed)
Brandi Villarreal DOB: 30-Jul-1945 Encounter date: 08/19/2019  This is a 74 y.o. female who presents to establish care.  Chief Complaint  Patient presents with  . Establish Care    History of present illness: Feels kind of weak sometimes; not sure if related to blood pressure. She did throw up last weds - but she ate potatoes. Has had issues with gallbladder in the past. When she got sick she had eaten potatoes. She is good about staying hydrated - drinks at least a quart of water a day. Not certain time of day that weakness, swimmy headedness is coming. She hasn't passed out; but when she was a kid she would pass out. Has hx of MVP. Has avoided salt this week. Just trying different eating plan. No heart racing. But yesterday when bent in garden heard heart in left ear.   Has hard time going to the bathroom. Will go if she takes magnesium, but it makes her have hot flashes. (last colonoscopy was 05/2010 and was normal).   Sees Dr. Elvera Lennox.   Taking Burn: caffeine  Osteoporosis: still on fosamax; [facility]. They also manage lichen sclerosis.   Aortic atherosclerosis: hyperlipidemia diet controlled. Tries to eat healthy, stays active.   Histoplasmosis lungs  Past Medical History:  Diagnosis Date  . Cholelithiasis    rare symptoms, declined surgery  . Colitis ?1975  . Hemorrhoids   . Lichen sclerosus    sees gyn for management  . Mitral valve prolapse   . Osteoporosis    sees gyn  . Varicose veins    Past Surgical History:  Procedure Laterality Date  . jaw resection    . RHINOPLASTY    . TONSILLECTOMY    . TUBAL LIGATION     bilteral   Allergies  Allergen Reactions  . Iodine     Anaphylaxis following IVP dye  . Penicillins     Has patient had a PCN reaction causing immediate rash, facial/tongue/throat swelling, SOB or lightheadedness with hypotension: Yes Has patient had a PCN reaction causing severe rash involving mucus membranes or skin necrosis:  No Has patient had a PCN reaction that required hospitalization: No Has patient had a PCN reaction occurring within the last 10 years: No If all of the above answers are "NO", then may proceed with Cephalosporin use.   . Shellfish Allergy Nausea And Vomiting  . Morphine     Mental status changes  . Sulfonamide Derivatives     itching  . Cortisone Itching    Due to eye disorder  . Doxycycline Hives    Ringing in ears  . Fluticasone Propionate     ? Reaction-advised not to take per eye doctor  . Hydrocodone Itching  . Other Nausea And Vomiting    Potato   Current Meds  Medication Sig  . albuterol (VENTOLIN HFA) 108 (90 Base) MCG/ACT inhaler Inhale 2 puffs into the lungs every 4 (four) hours as needed.  Marland Kitchen alendronate (FOSAMAX) 70 MG tablet alendronate 70 mg tablet  . CALCIUM PO Take by mouth.  . Cholecalciferol (VITAMIN D3 PO) Take by mouth.  . Coenzyme Q10 (CO Q-10 PO) Take by mouth.  . Cyanocobalamin (VITAMIN B-12 PO) Take by mouth.  . diphenhydrAMINE HCl (BENADRYL PO) Take by mouth as needed.  Marland Kitchen ibuprofen (ADVIL,MOTRIN) 200 MG tablet Take 200 mg by mouth every 6 (six) hours as needed for moderate pain.  Marland Kitchen MELATONIN PO Take by mouth.  . Multiple Vitamins-Minerals (ZINC PO) Take  by mouth.  Marland Kitchen OVER THE COUNTER MEDICATION Xclear nose drops  . OVER THE COUNTER MEDICATION CLA  . OVER THE COUNTER MEDICATION Vital reds  . OVER THE COUNTER MEDICATION Enzymes  . OVER THE COUNTER MEDICATION Lutein  . OVER THE COUNTER MEDICATION Turmeric  . OVER THE COUNTER MEDICATION Prebiotic  . Pyridoxine HCl (VITAMIN B-6 PO) Take by mouth.  . [DISCONTINUED] MAGNESIUM PO Take 1,000 mg by mouth.   Social History   Tobacco Use  . Smoking status: Never Smoker  . Smokeless tobacco: Never Used  Substance Use Topics  . Alcohol use: No    Comment: wine-rare   Family History  Problem Relation Age of Onset  . Colon cancer Brother 64       died at age 76  . Pancreatic cancer Brother   . Atrial  fibrillation Mother   . Alzheimer's disease Father   . Healthy Sister   . Healthy Sister   . Other Sister        immune issues?  . Breast cancer Sister 64  . Healthy Brother      Review of Systems  Constitutional: Negative for chills, fatigue and fever.  Respiratory: Negative for cough, chest tightness, shortness of breath and wheezing.   Cardiovascular: Negative for chest pain, palpitations and leg swelling.    Objective:  BP 112/80 (BP Location: Left Arm, Patient Position: Sitting, Cuff Size: Normal)   Pulse 74   Temp 97.8 F (36.6 C) (Temporal)   Ht 5' 1.5" (1.562 m)   Wt 150 lb 14.4 oz (68.4 kg)   SpO2 97%   BMI 28.05 kg/m   Weight: 150 lb 14.4 oz (68.4 kg)   BP Readings from Last 3 Encounters:  08/19/19 112/80  04/18/18 104/62  12/26/17 102/80   Wt Readings from Last 3 Encounters:  08/19/19 150 lb 14.4 oz (68.4 kg)  04/18/18 147 lb 6 oz (66.8 kg)  12/26/17 145 lb 6.4 oz (66 kg)    Physical Exam Constitutional:      General: She is not in acute distress.    Appearance: She is well-developed.  Neck:     Vascular: No carotid bruit.  Cardiovascular:     Rate and Rhythm: Normal rate and regular rhythm.     Heart sounds: Normal heart sounds. No murmur. No friction rub.  Pulmonary:     Effort: Pulmonary effort is normal. No respiratory distress.     Breath sounds: Normal breath sounds. No wheezing or rales.  Musculoskeletal:     Right lower leg: No edema.     Left lower leg: No edema.  Lymphadenopathy:     Cervical: No cervical adenopathy.  Neurological:     Mental Status: She is alert and oriented to person, place, and time.  Psychiatric:        Behavior: Behavior normal.     Assessment/Plan: 1. Light headed Start with bloodwork and consider further evaluation pending these results. - CBC with Differential/Platelet; Future - Comprehensive metabolic panel; Future - TSH; Future - TSH - Comprehensive metabolic panel - CBC with  Differential/Platelet  2. Elevated fasting glucose - Hemoglobin A1c; Future - Hemoglobin A1c  3. Osteoporosis, unspecified osteoporosis type, unspecified pathological fracture presence [facility] with [facility].  She is on Fosamax. - VITAMIN D 25 Hydroxy (Vit-D Deficiency, Fractures); Future - VITAMIN D 25 Hydroxy (Vit-D Deficiency, Fractures)  4. Lipid screening - Lipid panel; Future - Lipid panel  5. History of anemia - IBC + Ferritin; Future -  IBC + Ferritin  6. B12 deficiency - Vitamin B12; Future - Vitamin B12  7. Colon cancer screening - Cologuard  8. Histoplasmosis This is been noted on previous lung imaging.  Return for pending bloodwork.  Micheline Rough, MD

## 2019-08-20 LAB — IBC + FERRITIN
Ferritin: 26.5 ng/mL (ref 10.0–291.0)
Iron: 60 ug/dL (ref 42–145)
Saturation Ratios: 15.8 % — ABNORMAL LOW (ref 20.0–50.0)
Transferrin: 271 mg/dL (ref 212.0–360.0)

## 2019-08-20 LAB — VITAMIN D 25 HYDROXY (VIT D DEFICIENCY, FRACTURES): VITD: 59.02 ng/mL (ref 30.00–100.00)

## 2019-08-20 LAB — VITAMIN B12: Vitamin B-12: 533 pg/mL (ref 211–911)

## 2019-08-20 LAB — TSH: TSH: 0.73 u[IU]/mL (ref 0.35–4.50)

## 2019-08-21 DIAGNOSIS — B399 Histoplasmosis, unspecified: Secondary | ICD-10-CM | POA: Insufficient documentation

## 2019-08-29 DIAGNOSIS — M7062 Trochanteric bursitis, left hip: Secondary | ICD-10-CM | POA: Diagnosis not present

## 2019-08-29 DIAGNOSIS — M25552 Pain in left hip: Secondary | ICD-10-CM | POA: Diagnosis not present

## 2019-09-18 DIAGNOSIS — H35372 Puckering of macula, left eye: Secondary | ICD-10-CM | POA: Diagnosis not present

## 2020-01-09 ENCOUNTER — Telehealth: Payer: Self-pay | Admitting: Family Medicine

## 2020-01-09 NOTE — Telephone Encounter (Signed)
Brandi Manns, NP from [place] called to let Dr. Ethlyn Gallery know that she did a P.A.D. test and it showed that both legs showed moderate reading.  The nurse told the patient to contact her doctor if she gets to feeling worse.

## 2020-01-10 NOTE — Telephone Encounter (Signed)
Noted in chart.

## 2020-01-20 ENCOUNTER — Telehealth: Payer: Self-pay | Admitting: Family Medicine

## 2020-01-20 NOTE — Telephone Encounter (Signed)
Left message for patient to call back and schedule Medicare Annual Wellness Visit (AWV) either virtually or in office.  Last AWV 12/26/17; please schedule at anytime with [facility] Nurse Health Advisor 2.  This should be a 45 minute visit.

## 2020-02-18 ENCOUNTER — Encounter: Payer: Self-pay | Admitting: Family Medicine

## 2020-02-18 LAB — FECAL OCCULT BLOOD, GUAIAC: Fecal Occult Blood: NEGATIVE

## 2020-02-22 LAB — IFOBT (OCCULT BLOOD): IFOBT: NEGATIVE

## 2020-03-05 ENCOUNTER — Encounter: Payer: Self-pay | Admitting: Family Medicine

## 2020-05-13 ENCOUNTER — Ambulatory Visit (INDEPENDENT_AMBULATORY_CARE_PROVIDER_SITE_OTHER): Payer: Medicare Other

## 2020-05-13 ENCOUNTER — Other Ambulatory Visit: Payer: Self-pay

## 2020-05-13 VITALS — BP 118/72 | HR 75 | Temp 97.9°F | Wt 147.3 lb

## 2020-05-13 DIAGNOSIS — Z0000 Encounter for general adult medical examination without abnormal findings: Secondary | ICD-10-CM

## 2020-05-13 NOTE — Progress Notes (Signed)
Subjective:   Brandi Villarreal is a 75 y.o. female who presents for Medicare Annual (Subsequent) preventive examination.  Review of Systems    N/A  Cardiac Risk Factors include: advanced age (>22men, >58 women)     Objective:    Today's Vitals   05/13/20 1505  BP: 118/72  Pulse: 75  Temp: 97.9 F (36.6 C)  TempSrc: Oral  SpO2: 98%  Weight: 147 lb 5 oz (66.8 kg)   Body mass index is 27.38 kg/m.  Advanced Directives 05/13/2020 01/23/2018 08/23/2016  Does Patient Have a Medical Advance Directive? Yes Yes Yes  Type of Paramedic of [place];Living will [place];Living will Living will;Healthcare Power of Attorney  Does patient want to make changes to medical advance directive? No - Patient declined No - Patient declined No - Patient declined  Copy of [place] in Chart? Yes - validated most recent copy scanned in chart (See row information) No - copy requested No - copy requested    Current Medications (verified) Outpatient Encounter Medications as of 05/13/2020  Medication Sig  . alendronate (FOSAMAX) 70 MG tablet alendronate 70 mg tablet  . CALCIUM PO Take by mouth.  . Cholecalciferol (VITAMIN D3 PO) Take by mouth.  . Cyanocobalamin (VITAMIN B-12 PO) Take by mouth.  Marland Kitchen ibuprofen (ADVIL,MOTRIN) 200 MG tablet Take 200 mg by mouth every 6 (six) hours as needed for moderate pain.  Marland Kitchen MELATONIN PO Take by mouth.  Marland Kitchen OVER THE COUNTER MEDICATION Xclear nose drops  . OVER THE COUNTER MEDICATION Vital reds  . OVER THE COUNTER MEDICATION Enzymes  . OVER THE COUNTER MEDICATION Turmeric  . Pyridoxine HCl (VITAMIN B-6 PO) Take by mouth.  Marland Kitchen albuterol (VENTOLIN HFA) 108 (90 Base) MCG/ACT inhaler Inhale 2 puffs into the lungs every 4 (four) hours as needed. (Patient not taking: Reported on 05/13/2020)  . Coenzyme Q10 (CO Q-10 PO) Take by mouth. (Patient not taking: Reported on 05/13/2020)  . Multiple Vitamins-Minerals (ZINC PO) Take by  mouth. (Patient not taking: Reported on 05/13/2020)  . OVER THE COUNTER MEDICATION CLA (Patient not taking: Reported on 05/13/2020)  . OVER THE COUNTER MEDICATION Lutein (Patient not taking: Reported on 05/13/2020)  . OVER THE COUNTER MEDICATION Prebiotic (Patient not taking: Reported on 05/13/2020)  . [DISCONTINUED] diphenhydrAMINE HCl (BENADRYL PO) Take by mouth as needed. (Patient not taking: Reported on 05/13/2020)   No facility-administered encounter medications on file as of 05/13/2020.    Allergies (verified) Iodine, Penicillins, Shellfish allergy, Morphine, Sulfonamide derivatives, Cortisone, Doxycycline, Fluticasone propionate, Hydrocodone, and Other   History: Past Medical History:  Diagnosis Date  . Cholelithiasis    rare symptoms, declined surgery  . Colitis ?1975  . Hemorrhoids   . Lichen sclerosus    sees gyn for management  . Mitral valve prolapse   . Osteoporosis    sees gyn  . Varicose veins    Past Surgical History:  Procedure Laterality Date  . jaw resection    . RHINOPLASTY    . TONSILLECTOMY    . TUBAL LIGATION     bilteral   Family History  Problem Relation Age of Onset  . Colon cancer Brother 87       died at age 41  . Pancreatic cancer Brother   . Atrial fibrillation Mother   . Alzheimer's disease Father   . Healthy Sister   . Healthy Sister   . Other Sister        immune issues?  Marland Kitchen  Breast cancer Sister 11  . Healthy Brother    Social History   Socioeconomic History  . Marital status: Married    Spouse name: Not on file  . Number of children: Not on file  . Years of education: Not on file  . Highest education level: Not on file  Occupational History  . Not on file  Tobacco Use  . Smoking status: Never Smoker  . Smokeless tobacco: Never Used  Vaping Use  . Vaping Use: Never used  Substance and Sexual Activity  . Alcohol use: No    Comment: wine-rare  . Drug use: No  . Sexual activity: Not on file  Other Topics Concern  . Not on file   Social History Narrative   Work or School: retired - Merchandiser, retail      Home Situation: lives with husband      Spiritual Beliefs: Christian      Lifestyle: no regular exercise; diet is good   Social Determinants of Radio broadcast assistant Strain: Low Risk   . Difficulty of Paying Living Expenses: Not hard at all  Food Insecurity: No Food Insecurity  . Worried About Charity fundraiser in the Last Year: Never true  . Ran Out of Food in the Last Year: Never true  Transportation Needs: No Transportation Needs  . Lack of Transportation (Medical): No  . Lack of Transportation (Non-Medical): No  Physical Activity: Sufficiently Active  . Days of Exercise per Week: 7 days  . Minutes of Exercise per Session: 60 min  Stress: No Stress Concern Present  . Feeling of Stress : Not at all  Social Connections: Moderately Integrated  . Frequency of Communication with Friends and Family: More than three times a week  . Frequency of Social Gatherings with Friends and Family: More than three times a week  . Attends Religious Services: More than 4 times per year  . Active Member of Clubs or Organizations: No  . Attends Archivist Meetings: Never  . Marital Status: Married    Tobacco Counseling Counseling given: Not Answered   Clinical Intake:  Pre-visit preparation completed: Yes  Pain : No/denies pain     Nutritional Risks: None Diabetes: No  How often do you need to have someone help you when you read instructions, pamphlets, or other written materials from your doctor or pharmacy?: 1 - Never  Diabetic?No   Interpreter Needed?: No  Information entered by :: [place] of Daily Living In your present state of health, do you have any difficulty performing the following activities: 05/13/2020  Hearing? N  Vision? N  Difficulty concentrating or making decisions? N  Walking or climbing stairs? N  Dressing or bathing? N  Doing errands, shopping? N   Preparing Food and eating ? N  Using the Toilet? N  In the past six months, have you accidently leaked urine? Y  Do you have problems with loss of bowel control? N  Managing your Medications? N  Managing your Finances? N  Housekeeping or managing your Housekeeping? N  Some recent data might be hidden    Patient Care Team: Caren Macadam, MD as PCP - General (Family Medicine) Harriett Sine, MD as Consulting Physician (Dermatology) Ladene Artist, MD as Consulting Physician (Gastroenterology) Leandrew Koyanagi, MD as Attending Physician (Orthopedic Surgery) [facility], [facility]  Indicate any recent Medical Services you may have received from other than [facility] providers in the past year (date may  be approximate).     Assessment:   This is a routine wellness examination for Brandi Villarreal.  Hearing/Vision screen  Hearing Screening   125Hz  250Hz  500Hz  1000Hz  2000Hz  3000Hz  4000Hz  6000Hz  8000Hz   Right ear:           Left ear:           Vision Screening Comments: Patient gets eyes examined once every 6 months. Has wrinkle on macular of left eye   Dietary issues and exercise activities discussed: Current Exercise Habits: Home exercise routine, Type of exercise: stretching;strength training/weights;walking, Time (Minutes): 60, Frequency (Times/Week): 7, Weekly Exercise (Minutes/Week): 420, Intensity: Moderate  Goals    . Patient Stated     I would like to keep getting stronger      Depression Screen PHQ 2/9 Scores 05/13/2020 12/26/2017 07/26/2016  PHQ - 2 Score 0 0 0    Fall Risk Fall Risk  05/13/2020 12/26/2017 07/26/2016  Falls in the past year? 0 Yes No  Number falls in past yr: 0 1 -  Injury with Fall? 0 Yes -  Risk for fall due to : No Fall Risks - -  Follow up Falls evaluation completed;Falls prevention discussed - -    FALL RISK PREVENTION PERTAINING TO THE HOME:  Any stairs in or around the home? Yes  If so, are there any without handrails? No  Home free of  loose throw rugs in walkways, pet beds, electrical cords, etc? Yes  Adequate lighting in your home to reduce risk of falls? Yes   ASSISTIVE DEVICES UTILIZED TO PREVENT FALLS:  Life alert? No  Use of a cane, walker or w/c? No  Grab bars in the bathroom? No  Shower chair or bench in shower? No  Elevated toilet seat or a handicapped toilet? No   TIMED UP AND GO:  Was the test performed? Yes .  Length of time to ambulate 10 feet: 3 sec.   Gait steady and fast without use of assistive device  Cognitive Function: Normal cognitive status assessed by direct observation by this Nurse Health Advisor. No abnormalities found.          Immunizations Immunization History  Administered Date(s) Administered  . PFIZER(Purple Top)SARS-COV-2 Vaccination 05/26/2019, 06/25/2019  . Td 07/26/2016    TDAP status: Up to date  Flu Vaccine status: Due, Education has been provided regarding the importance of this vaccine. Advised may receive this vaccine at local pharmacy or Health Dept. Aware to provide a copy of the vaccination record if obtained from local pharmacy or Health Dept. Verbalized acceptance and understanding.  Pneumococcal vaccine status: Declined,  Education has been provided regarding the importance of this vaccine but patient still declined. Advised may receive this vaccine at local pharmacy or Health Dept. Aware to provide a copy of the vaccination record if obtained from local pharmacy or Health Dept. Verbalized acceptance and understanding.   Covid-19 vaccine status: Completed vaccines  Qualifies for Shingles Vaccine? Yes   Zostavax completed No   Shingrix Completed?: No.    Education has been provided regarding the importance of this vaccine. Patient has been advised to call insurance company to determine out of pocket expense if they have not yet received this vaccine. Advised may also receive vaccine at local pharmacy or Health Dept. Verbalized acceptance and  understanding.  Screening Tests Health Maintenance  Topic Date Due  . MAMMOGRAM  10/03/2019  . COVID-19 Vaccine (3 - Booster for Pfizer series) 12/26/2019  . COLONOSCOPY (Pts 45-45yrs Insurance coverage will  need to be confirmed)  05/07/2020  . INFLUENZA VACCINE  11/30/2025 (Originally 11/03/2019)  . PNA vac Low Risk Adult (1 of 2 - PCV13) 11/30/2025 (Originally 01/21/2011)  . COLON CANCER SCREENING ANNUAL FOBT  02/17/2021  . TETANUS/TDAP  07/27/2026  . DEXA SCAN  Completed  . Hepatitis C Screening  Completed    Health Maintenance  Health Maintenance Due  Topic Date Due  . MAMMOGRAM  10/03/2019  . COVID-19 Vaccine (3 - Booster for Pfizer series) 12/26/2019  . COLONOSCOPY (Pts 45-20yrs Insurance coverage will need to be confirmed)  05/07/2020    Colorectal cancer screening: Type of screening: FOBT/FIT. Completed 02/18/2020. Repeat every 1 years  Mammogram status: No longer required due to patient preference .  Bone Density status: Ordered 05/13/2020. Pt provided with contact info and advised to call to schedule appt.  Lung Cancer Screening: (Low Dose CT Chest recommended if Age 65-80 years, 30 pack-year currently smoking OR have quit w/in 15years.) does not qualify.   Lung Cancer Screening Referral: N/A   Additional Screening:  Hepatitis C Screening: does qualify; Completed 07/26/2016  Vision Screening: Recommended annual ophthalmology exams for early detection of glaucoma and other disorders of the eye. Is the patient up to date with their annual eye exam?  Yes  Who is the provider or what is the name of the office in which the patient attends annual eye exams? Dr. [place] If pt is not established with a provider, would they like to be referred to a provider to establish care? No .   Dental Screening: Recommended annual dental exams for proper oral hygiene  Community Resource Referral / Chronic Care Management: CRR required this visit?  No   CCM required this visit?  No       Plan:     I have personally reviewed and noted the following in the patient's chart:   . Medical and social history . Use of alcohol, tobacco or illicit drugs  . Current medications and supplements . Functional ability and status . Nutritional status . Physical activity . Advanced directives . List of other physicians . Hospitalizations, surgeries, and ER visits in previous 12 months . Vitals . Screenings to include cognitive, depression, and falls . Referrals and appointments  In addition, I have reviewed and discussed with patient certain preventive protocols, quality metrics, and best practice recommendations. A written personalized care plan for preventive services as well as general preventive health recommendations were provided to patient.     Ofilia Neas, LPN   10/07/4490   Nurse Notes: None

## 2020-05-13 NOTE — Patient Instructions (Signed)
Ms. Hayse , Thank you for taking time to come for your Medicare Wellness Visit. I appreciate your ongoing commitment to your health goals. Please review the following plan we discussed and let me know if I can assist you in the future.   Screening recommendations/referrals: Colonoscopy: Up to date, next FOBT test due 02/2021 Mammogram: Patient declined  Bone Density: Currently due, orders placed this visit  Recommended yearly ophthalmology/optometry visit for glaucoma screening and checkup Recommended yearly dental visit for hygiene and checkup  Vaccinations: Influenza vaccine: Patient declined  Pneumococcal vaccine: Patient declined  Tdap vaccine: Up to date, next due 07/27/2026 Shingles vaccine: Currently due,you may receive at your local pharmacy.    Advanced directives: Please bring copies of your advanced medical directives so that we may scan into your chart.  Conditions/risks identified: None   Next appointment: None    Preventive Care 65 Years and Older, Female Preventive care refers to lifestyle choices and visits with your health care provider that can promote health and wellness. What does preventive care include?  A yearly physical exam. This is also called an annual well check.  Dental exams once or twice a year.  Routine eye exams. Ask your health care provider how often you should have your eyes checked.  Personal lifestyle choices, including:  Daily care of your teeth and gums.  Regular physical activity.  Eating a healthy diet.  Avoiding tobacco and drug use.  Limiting alcohol use.  Practicing safe sex.  Taking low-dose aspirin every day.  Taking vitamin and mineral supplements as recommended by your health care provider. What happens during an annual well check? The services and screenings done by your health care provider during your annual well check will depend on your age, overall health, lifestyle risk factors, and family history of  disease. Counseling  Your health care provider may ask you questions about your:  Alcohol use.  Tobacco use.  Drug use.  Emotional well-being.  Home and relationship well-being.  Sexual activity.  Eating habits.  History of falls.  Memory and ability to understand (cognition).  Work and work Statistician.  Reproductive health. Screening  You may have the following tests or measurements:  Height, weight, and BMI.  Blood pressure.  Lipid and cholesterol levels. These may be checked every 5 years, or more frequently if you are over 27 years old.  Skin check.  Lung cancer screening. You may have this screening every year starting at age 60 if you have a 30-pack-year history of smoking and currently smoke or have quit within the past 15 years.  Fecal occult blood test (FOBT) of the stool. You may have this test every year starting at age 55.  Flexible sigmoidoscopy or colonoscopy. You may have a sigmoidoscopy every 5 years or a colonoscopy every 10 years starting at age 67.  Hepatitis C blood test.  Hepatitis B blood test.  Sexually transmitted disease (STD) testing.  Diabetes screening. This is done by checking your blood sugar (glucose) after you have not eaten for a while (fasting). You may have this done every 1-3 years.  Bone density scan. This is done to screen for osteoporosis. You may have this done starting at age 66.  Mammogram. This may be done every 1-2 years. Talk to your health care provider about how often you should have regular mammograms. Talk with your health care provider about your test results, treatment options, and if necessary, the need for more tests. Vaccines  Your health care provider may  recommend certain vaccines, such as:  Influenza vaccine. This is recommended every year.  Tetanus, diphtheria, and acellular pertussis (Tdap, Td) vaccine. You may need a Td booster every 10 years.  Zoster vaccine. You may need this after age  6.  Pneumococcal 13-valent conjugate (PCV13) vaccine. One dose is recommended after age 52.  Pneumococcal polysaccharide (PPSV23) vaccine. One dose is recommended after age 79. Talk to your health care provider about which screenings and vaccines you need and how often you need them. This information is not intended to replace advice given to you by your health care provider. Make sure you discuss any questions you have with your health care provider. Document Released: 04/17/2015 Document Revised: 12/09/2015 Document Reviewed: 01/20/2015 Elsevier Interactive Patient Education  2017 [place] Prevention in the Home Falls can cause injuries. They can happen to people of all ages. There are many things you can do to make your home safe and to help prevent falls. What can I do on the outside of my home?  Regularly fix the edges of walkways and driveways and fix any cracks.  Remove anything that might make you trip as you walk through a door, such as a raised step or threshold.  Trim any bushes or trees on the path to your home.  Use bright outdoor lighting.  Clear any walking paths of anything that might make someone trip, such as rocks or tools.  Regularly check to see if handrails are loose or broken. Make sure that both sides of any steps have handrails.  Any raised decks and porches should have guardrails on the edges.  Have any leaves, snow, or ice cleared regularly.  Use sand or salt on walking paths during winter.  Clean up any spills in your garage right away. This includes oil or grease spills. What can I do in the bathroom?  Use night lights.  Install grab bars by the toilet and in the tub and shower. Do not use towel bars as grab bars.  Use non-skid mats or decals in the tub or shower.  If you need to sit down in the shower, use a plastic, non-slip stool.  Keep the floor dry. Clean up any water that spills on the floor as soon as it happens.  Remove  soap buildup in the tub or shower regularly.  Attach bath mats securely with double-sided non-slip rug tape.  Do not have throw rugs and other things on the floor that can make you trip. What can I do in the bedroom?  Use night lights.  Make sure that you have a light by your bed that is easy to reach.  Do not use any sheets or blankets that are too big for your bed. They should not hang down onto the floor.  Have a firm chair that has side arms. You can use this for support while you get dressed.  Do not have throw rugs and other things on the floor that can make you trip. What can I do in the kitchen?  Clean up any spills right away.  Avoid walking on wet floors.  Keep items that you use a lot in easy-to-reach places.  If you need to reach something above you, use a strong step stool that has a grab bar.  Keep electrical cords out of the way.  Do not use floor polish or wax that makes floors slippery. If you must use wax, use non-skid floor wax.  Do not have throw rugs and  other things on the floor that can make you trip. What can I do with my stairs?  Do not leave any items on the stairs.  Make sure that there are handrails on both sides of the stairs and use them. Fix handrails that are broken or loose. Make sure that handrails are as long as the stairways.  Check any carpeting to make sure that it is firmly attached to the stairs. Fix any carpet that is loose or worn.  Avoid having throw rugs at the top or bottom of the stairs. If you do have throw rugs, attach them to the floor with carpet tape.  Make sure that you have a light switch at the top of the stairs and the bottom of the stairs. If you do not have them, ask someone to add them for you. What else can I do to help prevent falls?  Wear shoes that:  Do not have high heels.  Have rubber bottoms.  Are comfortable and fit you well.  Are closed at the toe. Do not wear sandals.  If you use a  stepladder:  Make sure that it is fully opened. Do not climb a closed stepladder.  Make sure that both sides of the stepladder are locked into place.  Ask someone to hold it for you, if possible.  Clearly mark and make sure that you can see:  Any grab bars or handrails.  First and last steps.  Where the edge of each step is.  Use tools that help you move around (mobility aids) if they are needed. These include:  Canes.  Walkers.  Scooters.  Crutches.  Turn on the lights when you go into a dark area. Replace any light bulbs as soon as they burn out.  Set up your furniture so you have a clear path. Avoid moving your furniture around.  If any of your floors are uneven, fix them.  If there are any pets around you, be aware of where they are.  Review your medicines with your doctor. Some medicines can make you feel dizzy. This can increase your chance of falling. Ask your doctor what other things that you can do to help prevent falls. This information is not intended to replace advice given to you by your health care provider. Make sure you discuss any questions you have with your health care provider. Document Released: 01/15/2009 Document Revised: 08/27/2015 Document Reviewed: 04/25/2014 Elsevier Interactive Patient Education  2017 [employer].

## 2020-06-24 DIAGNOSIS — H40013 Open angle with borderline findings, low risk, bilateral: Secondary | ICD-10-CM | POA: Diagnosis not present

## 2020-09-09 ENCOUNTER — Ambulatory Visit: Payer: Medicare Other | Attending: Internal Medicine

## 2020-09-09 DIAGNOSIS — Z20822 Contact with and (suspected) exposure to covid-19: Secondary | ICD-10-CM | POA: Diagnosis not present

## 2020-09-10 LAB — SARS-COV-2, NAA 2 DAY TAT

## 2020-09-10 LAB — NOVEL CORONAVIRUS, NAA: SARS-CoV-2, NAA: NOT DETECTED

## 2020-10-12 DIAGNOSIS — M25562 Pain in left knee: Secondary | ICD-10-CM | POA: Diagnosis not present

## 2020-10-26 DIAGNOSIS — M79671 Pain in right foot: Secondary | ICD-10-CM | POA: Diagnosis not present

## 2020-10-26 DIAGNOSIS — M5441 Lumbago with sciatica, right side: Secondary | ICD-10-CM | POA: Diagnosis not present

## 2020-10-26 DIAGNOSIS — M25561 Pain in right knee: Secondary | ICD-10-CM | POA: Diagnosis not present

## 2020-10-27 DIAGNOSIS — M545 Low back pain, unspecified: Secondary | ICD-10-CM | POA: Diagnosis not present

## 2020-10-28 DIAGNOSIS — M79605 Pain in left leg: Secondary | ICD-10-CM | POA: Diagnosis not present

## 2020-10-28 DIAGNOSIS — M79661 Pain in right lower leg: Secondary | ICD-10-CM | POA: Diagnosis not present

## 2020-10-30 DIAGNOSIS — M6281 Muscle weakness (generalized): Secondary | ICD-10-CM | POA: Diagnosis not present

## 2020-11-02 ENCOUNTER — Encounter: Payer: Self-pay | Admitting: *Deleted

## 2020-11-04 ENCOUNTER — Other Ambulatory Visit: Payer: Self-pay

## 2020-11-04 ENCOUNTER — Ambulatory Visit: Payer: Medicare Other | Admitting: Diagnostic Neuroimaging

## 2020-11-04 ENCOUNTER — Telehealth: Payer: Self-pay | Admitting: Diagnostic Neuroimaging

## 2020-11-04 ENCOUNTER — Encounter: Payer: Self-pay | Admitting: Diagnostic Neuroimaging

## 2020-11-04 VITALS — BP 118/62 | HR 67 | Ht 61.0 in | Wt 145.0 lb

## 2020-11-04 DIAGNOSIS — R531 Weakness: Secondary | ICD-10-CM

## 2020-11-04 DIAGNOSIS — G959 Disease of spinal cord, unspecified: Secondary | ICD-10-CM

## 2020-11-04 DIAGNOSIS — R200 Anesthesia of skin: Secondary | ICD-10-CM

## 2020-11-04 DIAGNOSIS — Z0189 Encounter for other specified special examinations: Secondary | ICD-10-CM | POA: Diagnosis not present

## 2020-11-04 NOTE — Progress Notes (Signed)
[facility]  PATIENT: Brandi Villarreal DOB: 09/01/1945  REFERRING CLINICIAN: Phylliss Bob, MD HISTORY FROM: patient  REASON FOR VISIT: new consult    HISTORICAL  CHIEF COMPLAINT:  Chief Complaint  Patient presents with   New Patient (Initial Visit)    RM 7 with husband- here to discuss worsening numbness/tingling in right leg. Pt has completed ortho work up, unremarkable finding to explain her symptoms.Marland Kitchen     HISTORY OF PRESENT ILLNESS:   75 year old female here for evaluation of right leg numbness and weakness.  10/10/2020 patient was climbing into bed, on her knees when all of a sudden her left knee was injured.  The next morning her left knee was swollen and she had difficulty bearing weight on the left side.  2 days later she was back in [place] and went to [facility] orthopedic for evaluation.  She had cortisone injection of the left knee.  Left knee steadily improved over the next week.  10/24/2020 patient was out of town and woke up with sudden right toe and foot numbness and tingling.  She was having difficulty with muscle weakness on the right side and bearing weight.  Symptoms worsened over the next day.  She also noticed some right shoulder numbness and pain.  2 days later she also noticed some tingling station of the left foot.  She had MRI of the lumbar spine which showed mild to moderate spinal stenosis at L4-5 and some other degenerative changes.  Since then patient continues to have problems with gait and balance difficulty.  She is mainly using a wheelchair.  She is not able to stand on her own.  Needs extreme help from husband to transfer and use bathroom.  1 month prior to onset of symptoms patient and husband had a flu symptoms.  They were COVID-negative at that time.    REVIEW OF SYSTEMS: Full 14 system review of systems performed and negative with exception of: as per HPI.  ALLERGIES: Allergies  Allergen Reactions   Iodine      Anaphylaxis following IVP dye   Penicillins     Has patient had a PCN reaction causing immediate rash, facial/tongue/throat swelling, SOB or lightheadedness with hypotension: Yes Has patient had a PCN reaction causing severe rash involving mucus membranes or skin necrosis: No Has patient had a PCN reaction that required hospitalization: No Has patient had a PCN reaction occurring within the last 10 years: No If all of the above answers are "NO", then may proceed with Cephalosporin use.    Shellfish Allergy Nausea And Vomiting   Morphine     Mental status changes   Sulfonamide Derivatives     itching   Cortisone Itching    Due to eye disorder   Doxycycline Hives    Ringing in ears   Fluticasone Propionate     ? Reaction-advised not to take per eye doctor   Hydrocodone Itching   Other Nausea And Vomiting    Potato    HOME MEDICATIONS: Outpatient Medications Prior to Visit  Medication Sig Dispense Refill   albuterol (VENTOLIN HFA) 108 (90 Base) MCG/ACT inhaler Inhale 2 puffs into the lungs every 4 (four) hours as needed. 1 Inhaler 1   alendronate (FOSAMAX) 70 MG tablet alendronate 70 mg tablet     CALCIUM PO Take by mouth.     gabapentin (NEURONTIN) 100 MG capsule Take 100-300 mg by mouth at bedtime.     ibuprofen (ADVIL,MOTRIN) 200 MG tablet Take 200 mg by  mouth every 6 (six) hours as needed for moderate pain.     MELATONIN PO Take by mouth.     Multiple Vitamins-Minerals (ZINC PO) Take by mouth.     OVER THE COUNTER MEDICATION Xclear nose drops     OVER THE COUNTER MEDICATION CLA     OVER THE COUNTER MEDICATION Vital reds     OVER THE COUNTER MEDICATION Enzymes     OVER THE COUNTER MEDICATION Lutein     OVER THE COUNTER MEDICATION Turmeric     OVER THE COUNTER MEDICATION Prebiotic     Pyridoxine HCl (VITAMIN B-6 PO) Take by mouth.     Cholecalciferol (VITAMIN D3 PO) Take by mouth. (Patient not taking: Reported on 11/04/2020)     Coenzyme Q10 (CO Q-10 PO) Take by mouth.  (Patient not taking: No sig reported)     Cyanocobalamin (VITAMIN B-12 PO) Take by mouth. (Patient not taking: Reported on 11/04/2020)     No facility-administered medications prior to visit.    PAST MEDICAL HISTORY: Past Medical History:  Diagnosis Date   Cholelithiasis    rare symptoms, declined surgery   Colitis ?1975   Hemorrhoids    Lichen sclerosus    sees gyn for management   Mitral valve prolapse    Osteoporosis    sees gyn   Right leg weakness    Varicose veins     PAST SURGICAL HISTORY: Past Surgical History:  Procedure Laterality Date   jaw resection     RHINOPLASTY     TONSILLECTOMY     TUBAL LIGATION     bilteral    FAMILY HISTORY: Family History  Problem Relation Age of Onset   Colon cancer Brother 42       died at age 19   Pancreatic cancer Brother    Atrial fibrillation Mother    Alzheimer's disease Father    Healthy Sister    Healthy Sister    Other Sister        immune issues?   Breast cancer Sister 56   Healthy Brother     SOCIAL HISTORY: Social History   Socioeconomic History   Marital status: Married    Spouse name: [name]   Number of children: Not on file   Years of education: Not on file   Highest education level: Not on file  Occupational History    Comment: retired Radio broadcast assistant  Tobacco Use   Smoking status: Never   Smokeless tobacco: Never  Vaping Use   Vaping Use: Never used  Substance and Sexual Activity   Alcohol use: No    Comment: wine-rare   Drug use: No   Sexual activity: Not on file  Other Topics Concern   Not on file  Social History Narrative   Work or School: retired - Merchandiser, retail      Home Situation: lives with husband      Spiritual Beliefs: Christian      Lifestyle: no regular exercise; diet is good      Right handed       MB RN 11/04/2020    Social Determinants of Health   Financial Resource Strain: Low Risk    Difficulty of Paying Living Expenses: Not hard at all  Food Insecurity: No Food  Insecurity   Worried About Charity fundraiser in the Last Year: Never true   [place] in the Last Year: Never true  Transportation Needs: No Transportation Needs   Lack of Transportation (Medical): No  Lack of Transportation (Non-Medical): No  Physical Activity: Sufficiently Active   Days of Exercise per Week: 7 days   Minutes of Exercise per Session: 60 min  Stress: No Stress Concern Present   Feeling of Stress : Not at all  Social Connections: Moderately Integrated   Frequency of Communication with Friends and Family: More than three times a week   Frequency of Social Gatherings with Friends and Family: More than three times a week   Attends Religious Services: More than 4 times per year   Active Member of [employer] or Organizations: No   Attends Archivist Meetings: Never   Marital Status: Married  Human resources officer Violence: Not At Risk   Fear of Current or Ex-Partner: No   Emotionally Abused: No   Physically Abused: No   Sexually Abused: No     PHYSICAL EXAM  GENERAL EXAM/CONSTITUTIONAL: Vitals:  Vitals:   11/04/20 1021  BP: 118/62  Pulse: 67  SpO2: 97%  Weight: 145 lb (65.8 kg)  Height: '5\' 4"'$  (1.626 m)   Body mass index is 24.89 kg/m. Wt Readings from Last 3 Encounters:  11/04/20 145 lb (65.8 kg)  05/13/20 147 lb 5 oz (66.8 kg)  08/19/19 150 lb 14.4 oz (68.4 kg)   Patient is in no distress; well developed, nourished and groomed; neck is supple  CARDIOVASCULAR: Examination of carotid arteries is normal; no carotid bruits Regular rate and rhythm, no murmurs Examination of peripheral vascular system by observation and palpation is normal  EYES: Ophthalmoscopic exam of optic discs and posterior segments is normal; no papilledema or hemorrhages No results found.  MUSCULOSKELETAL: Gait, strength, tone, movements noted in Neurologic exam below  NEUROLOGIC: MENTAL STATUS:  No flowsheet data found. awake, alert, oriented to person, place and  time recent and remote memory intact normal attention and concentration language fluent, comprehension intact, naming intact fund of knowledge appropriate  CRANIAL NERVE:  2nd - no papilledema on fundoscopic exam 2nd, 3rd, 4th, 6th - pupils equal and reactive to light, visual fields full to confrontation, extraocular muscles intact, no nystagmus 5th - facial sensation symmetric 7th - facial strength symmetric 8th - hearing intact 9th - palate elevates symmetrically, uvula midline 11th - shoulder shrug symmetric 12th - tongue protrusion midline  MOTOR:  RUE DELTOID 3, TRICEPS 4; OTHERWISE 5 LUE 5 RLE HIP FLEX 3-4 (LIMITED BY PAIN), KNEE EXT 5, KNEE FLEX 2-3, DF 3 LLE 5  SENSORY:  normal and symmetric to light touch, temperature, vibration; EXCEPT DECR IN RIGHT KNEE  COORDINATION:  finger-nose-finger, fine finger movements normal  REFLEXES:  deep tendon reflexes 2+ and symmetric  GAIT/STATION:  BARELY ABLE TO STAND WITH ASSISTANCE; CANNOT TAKE ANY STEPS WITH RIGHT LEG     DIAGNOSTIC DATA (LABS, IMAGING, TESTING) - I reviewed patient records, labs, notes, testing and imaging myself where available.  Lab Results  Component Value Date   WBC 5.7 08/19/2019   HGB 14.7 08/19/2019   HCT 43.9 08/19/2019   MCV 93.1 08/19/2019   PLT 218.0 08/19/2019      Component Value Date/Time   NA 139 08/19/2019 1333   K 4.5 08/19/2019 1333   CL 105 08/19/2019 1333   CO2 28 08/19/2019 1333   GLUCOSE 92 08/19/2019 1333   BUN 18 08/19/2019 1333   CREATININE 0.74 08/19/2019 1333   CALCIUM 9.1 08/19/2019 1333   PROT 6.9 08/19/2019 1333   ALBUMIN 4.4 08/19/2019 1333   AST 19 08/19/2019 1333   ALT 13 08/19/2019 1333  ALKPHOS 80 08/19/2019 1333   BILITOT 0.6 08/19/2019 1333   GFRNONAA >60 06/28/2017 0940   GFRAA >60 06/28/2017 0940   Lab Results  Component Value Date   CHOL 145 08/19/2019   HDL 49.80 08/19/2019   LDLCALC 81 08/19/2019   TRIG 75.0 08/19/2019   CHOLHDL 3  08/19/2019   Lab Results  Component Value Date   HGBA1C 5.5 08/19/2019   Lab Results  Component Value Date   [id number] 08/19/2019   Lab Results  Component Value Date   TSH 0.73 08/19/2019    08/03/17 MRI lumbar spine [I reviewed images myself and agree with interpretation. -VRP]  1. At L4-L5, grade 1 anterolisthesis with mild to moderate canal and bilateral subarticular recess stenosis and mild right greater than left foraminal stenosis. 2. At L3-L4, right eccentric facet hypertrophy closely approximates the exiting right L3 nerve with mild-to-moderate right foraminal stenosis.    ASSESSMENT AND PLAN  75 y.o. year old female here with sudden onset right side leg numbness, weakness and pain starting on 10/24/2020.  Also with some right shoulder weakness.  Could represent stroke.  Other possibilities include cervical myelopathy or acute peripheral neuropathy such as Guillain-Barr syndrome.  We will proceed with further work-up.  Dx:  1. Right sided weakness   2. Lower extremity numbness   3. Disease of spinal cord (HCC)       PLAN:  - MRI brain (rule out stroke) - MRI cervical spine (rule out myelopathy) - EMG/NCS (eval neuropathy / GBS) - check labs - use wheelchair, walker; fall precautions  Orders Placed This Encounter  Procedures   MR BRAIN W WO CONTRAST   MR CERVICAL SPINE W WO CONTRAST   Vitamin B12   Hemoglobin A1c   TSH   CBC with Differential/Platelet   Comprehensive metabolic panel   NCV with EMG(electromyography)   Return for for NCV/EMG.    Penni Bombard, MD [id number], [id number] AM Certified in Neurology, Neurophysiology and Neuroimaging  [facility] [address] [place], [place] [address] [phone]

## 2020-11-04 NOTE — Telephone Encounter (Signed)
MRI cervical spine w/wo & brain w/wo [employer] medicare: NPR ref # [id number]   Sent to GI for scheduling

## 2020-11-05 LAB — HEMOGLOBIN A1C
Est. average glucose Bld gHb Est-mCnc: 114 mg/dL
Hgb A1c MFr Bld: 5.6 % (ref 4.8–5.6)

## 2020-11-05 LAB — COMPREHENSIVE METABOLIC PANEL
ALT: 14 IU/L (ref 0–32)
AST: 16 IU/L (ref 0–40)
Albumin/Globulin Ratio: 1.5 (ref 1.2–2.2)
Albumin: 4 g/dL (ref 3.7–4.7)
Alkaline Phosphatase: 112 IU/L (ref 44–121)
BUN/Creatinine Ratio: 21 (ref 12–28)
BUN: 14 mg/dL (ref 8–27)
Bilirubin Total: 0.4 mg/dL (ref 0.0–1.2)
CO2: 24 mmol/L (ref 20–29)
Calcium: 9.2 mg/dL (ref 8.7–10.3)
Chloride: 104 mmol/L (ref 96–106)
Creatinine, Ser: 0.68 mg/dL (ref 0.57–1.00)
Globulin, Total: 2.6 g/dL (ref 1.5–4.5)
Glucose: 94 mg/dL (ref 65–99)
Potassium: 4.6 mmol/L (ref 3.5–5.2)
Sodium: 142 mmol/L (ref 134–144)
Total Protein: 6.6 g/dL (ref 6.0–8.5)
eGFR: 91 mL/min/{1.73_m2} (ref >59)

## 2020-11-05 LAB — CBC WITH DIFFERENTIAL/PLATELET
Basophils Absolute: 0 10*3/uL (ref 0.0–0.2)
Basos: 1 %
EOS (ABSOLUTE): 0.2 10*3/uL (ref 0.0–0.4)
Eos: 3 %
Hematocrit: 45.8 % (ref 34.0–46.6)
Hemoglobin: 15 g/dL (ref 11.1–15.9)
Immature Grans (Abs): 0 10*3/uL (ref 0.0–0.1)
Immature Granulocytes: 0 %
Lymphocytes Absolute: 1.7 10*3/uL (ref 0.7–3.1)
Lymphs: 23 %
MCH: 30.8 pg (ref 26.6–33.0)
MCHC: 32.8 g/dL (ref 31.5–35.7)
MCV: 94 fL (ref 79–97)
Monocytes Absolute: 0.9 10*3/uL (ref 0.1–0.9)
Monocytes: 12 %
Neutrophils Absolute: 4.4 10*3/uL (ref 1.4–7.0)
Neutrophils: 61 %
Platelets: 239 10*3/uL (ref 150–450)
RBC: 4.87 x10E6/uL (ref 3.77–5.28)
RDW: 12.6 % (ref 11.7–15.4)
WBC: 7.2 10*3/uL (ref 3.4–10.8)

## 2020-11-05 LAB — TSH: TSH: 1.3 u[IU]/mL (ref 0.450–4.500)

## 2020-11-05 LAB — VITAMIN B12: Vitamin B-12: 1184 pg/mL (ref 232–1245)

## 2020-11-09 ENCOUNTER — Telehealth: Payer: Self-pay | Admitting: Diagnostic Neuroimaging

## 2020-11-09 ENCOUNTER — Other Ambulatory Visit: Payer: Self-pay | Admitting: Diagnostic Neuroimaging

## 2020-11-09 DIAGNOSIS — R531 Weakness: Secondary | ICD-10-CM

## 2020-11-09 DIAGNOSIS — G959 Disease of spinal cord, unspecified: Secondary | ICD-10-CM

## 2020-11-09 DIAGNOSIS — R200 Anesthesia of skin: Secondary | ICD-10-CM

## 2020-11-09 NOTE — Telephone Encounter (Signed)
[employer] medicare no auth. Patient is scheduled at [facility] long for 11/17/20.

## 2020-11-10 NOTE — Telephone Encounter (Signed)
Pt's husband, Helem Kwasnik (on [place]) called, want to make sure insurance has been set up with [facility] for scheduled MRI on 8/16. Would like a call from the nurse.

## 2020-11-10 NOTE — Telephone Encounter (Signed)
I spoke with the patient husband and informed him no authorization is require to go to [facility] and that they were good to go for 11/17/20.

## 2020-11-12 ENCOUNTER — Encounter: Payer: Self-pay | Admitting: *Deleted

## 2020-11-17 ENCOUNTER — Ambulatory Visit (HOSPITAL_COMMUNITY)
Admission: RE | Admit: 2020-11-17 | Discharge: 2020-11-17 | Disposition: A | Discharge status: Home / Self Care | Payer: Medicare Other | Source: Ambulatory Visit | Attending: Diagnostic Neuroimaging | Admitting: Diagnostic Neuroimaging

## 2020-11-17 ENCOUNTER — Other Ambulatory Visit: Payer: Self-pay

## 2020-11-17 DIAGNOSIS — R200 Anesthesia of skin: Secondary | ICD-10-CM

## 2020-11-17 DIAGNOSIS — Z8669 Personal history of other diseases of the nervous system and sense organs: Secondary | ICD-10-CM | POA: Diagnosis not present

## 2020-11-17 DIAGNOSIS — R531 Weakness: Secondary | ICD-10-CM

## 2020-11-17 DIAGNOSIS — G959 Disease of spinal cord, unspecified: Secondary | ICD-10-CM | POA: Diagnosis not present

## 2020-11-17 DIAGNOSIS — M4319 Spondylolisthesis, multiple sites in spine: Secondary | ICD-10-CM | POA: Diagnosis not present

## 2020-11-17 IMAGING — MR MR CERVICAL SPINE WO/W CM
24 series · 48 of 48 positions shown · IV contrast (gadavist)
Comparison: No pertinent prior exams available for comparison.

CLINICAL DATA: Right sided weakness [ZL] ([ZL]-CM). Myelopathy,
acute or progressive; right sided weakness; neck pain; rule out
myelopathy. Lower extremity numbness [ZL] ([ZL]-CM). Disease of
spinal cord (HCC) [ZL] ([ZL]-CM). Additional history provided by
technologist: Right-sided weakness, myelopathy, neck pain,
difficulty ambulating.

EXAM:
MRI CERVICAL SPINE WITHOUT AND WITH CONTRAST
TECHNIQUE: Multiplanar and multiecho pulse sequences of the cervical spine, to
include the craniocervical junction and cervicothoracic junction,
were obtained without and with intravenous contrast.
CONTRAST:  6mL GADAVIST GADOBUTROL 1 MMOL/ML IV SOLN

[Series 5: DWI · axial · 3.0mm · 1.36mm/px · z∈[-35,+110]mm · 5 of 100 slices shown (1 of 2)]
[im 1/100]
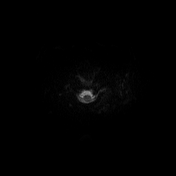
[im 25/100]
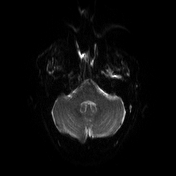
[im 50/100]
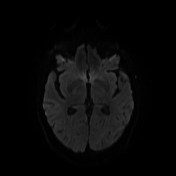
[im 75/100]
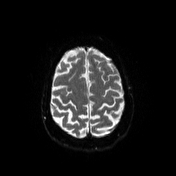
[im 100/100]
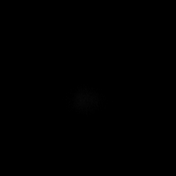

[Series 6: DWI · axial · 3.0mm · 1.36mm/px · z∈[-35,+110]mm · 2 of 50 slices shown (2 of 2)]
[im 1/50]
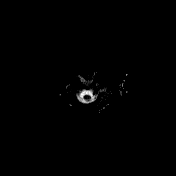
[im 50/50]
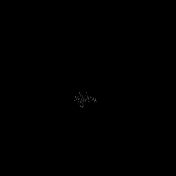

[Series 7: T1 · sagittal · 5.0mm · 0.75mm/px · 1 of 24 slices shown (1 of 5)]
[im 1/24]
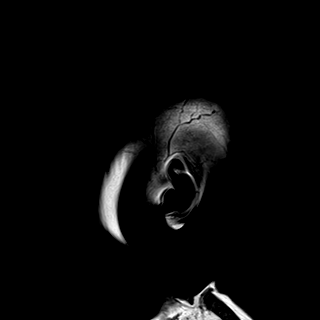

[Series 8: T2 · axial · 5.0mm · 0.62mm/px · 1 of 23 slices shown (1 of 3)]
[im 1/23]
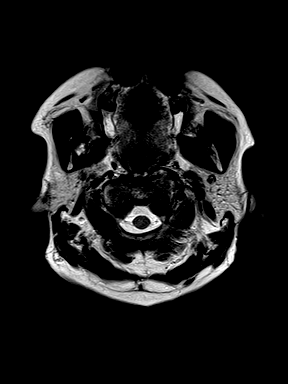

[Series 9: swi_images · axial · 3.0mm · 0.75mm/px · z∈[-29,+121]mm · 2 of 52 slices shown]
[im 1/52]
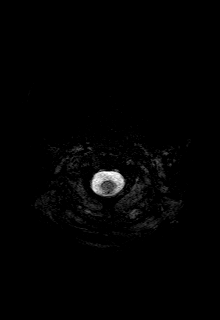
[im 52/52]
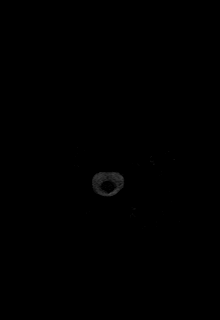

[Series 11: FLAIR · axial · 3.0mm · 0.75mm/px · z∈[-24,+115]mm · 2 of 48 slices shown]
[im 1/48]
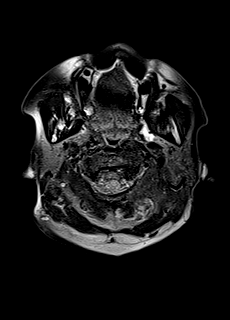
[im 48/48]
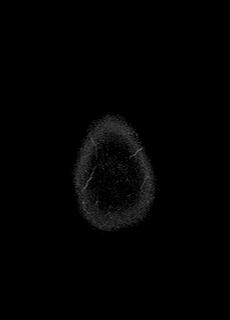

[Series 12: T1 · sagittal · 5.0mm · 0.75mm/px · 1 of 22 slices shown (2 of 5)]
[im 1/22]
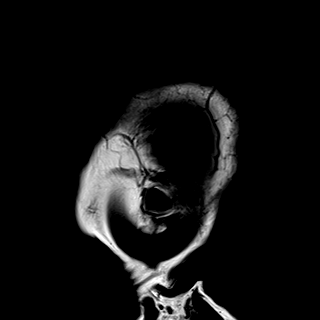

[Series 13: T1 · axial · 1.0mm · 0.94mm/px · z∈[-29,+127]mm · 8 of 160 slices shown (3 of 5)]
[im 1/160]
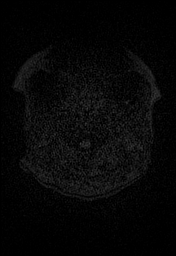
[im 23/160]
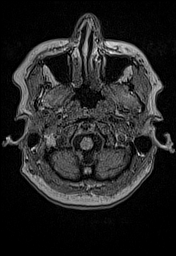
[im 46/160]
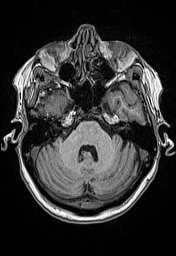
[im 69/160]
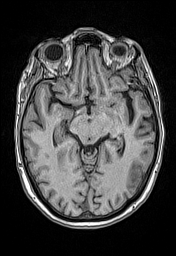
[im 91/160]
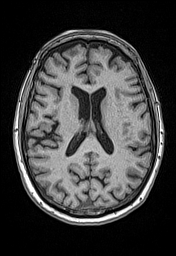
[im 114/160]
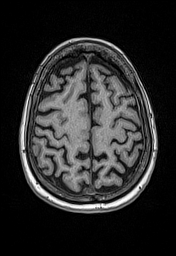
[im 137/160]
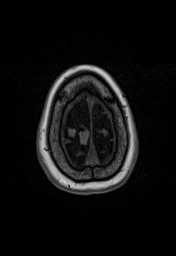
[im 160/160]
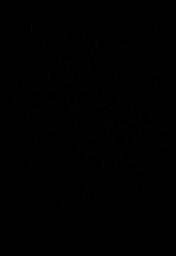

[Series 14: cor dwi_tracew · coronal · 5.0mm · 1.53mm/px · 3 of 54 slices shown]
[im 1/54]
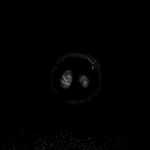
[im 27/54]
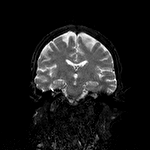
[im 54/54]
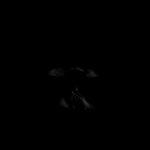

[Series 15: cor dwi_adc · coronal · 5.0mm · 1.53mm/px · 1 of 27 slices shown]
[im 1/27]
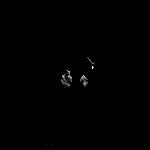

[Series 16: T2 · coronal · 5.0mm · 0.57mm/px · 2 of 32 slices shown (2 of 3)]
[im 1/32]
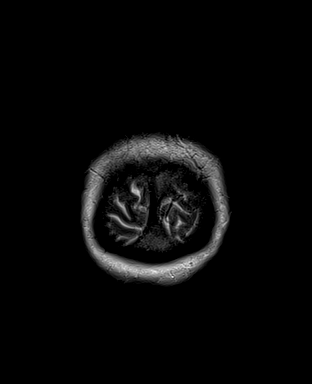
[im 32/32]
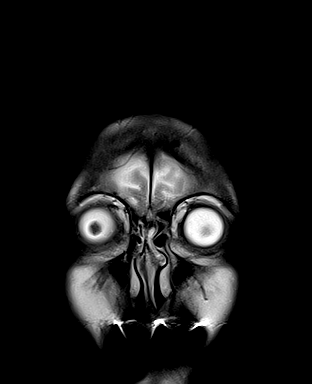

[Series 21: T1 · sagittal · 3.0mm · 0.69mm/px · 1 of 13 slices shown (4 of 5)]
[im 1/13]
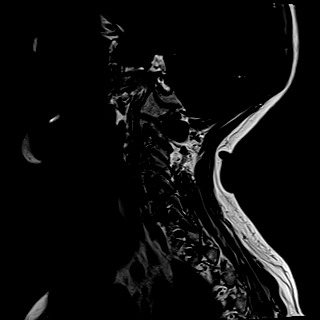

[Series 22: STIR · sagittal · 3.0mm · 0.86mm/px · 1 of 13 slices shown]
[im 1/13]
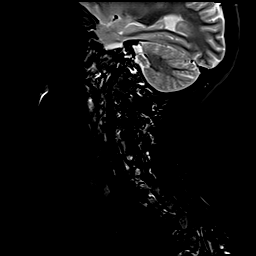

[Series 23: T2 · axial · 3.0mm · 0.70mm/px · 1 of 29 slices shown (3 of 3)]
[im 1/29]
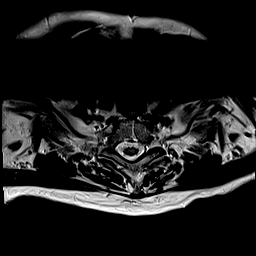

[Series 24: GRE · axial · 3.0mm · 0.35mm/px · 1 of 29 slices shown]
[im 1/29]
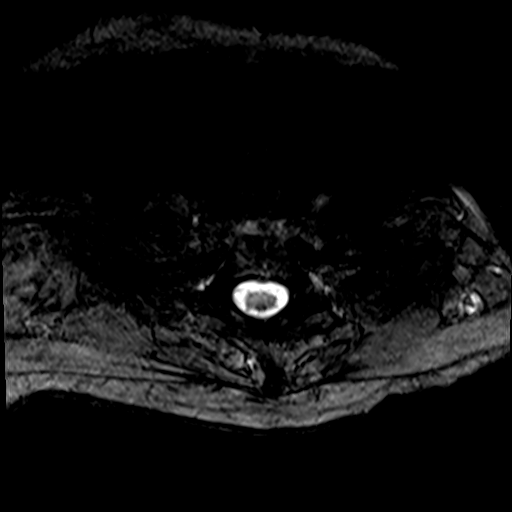

[Series 25: T1 · axial · 3.0mm · 0.35mm/px · 1 of 29 slices shown (5 of 5)]
[im 1/29]
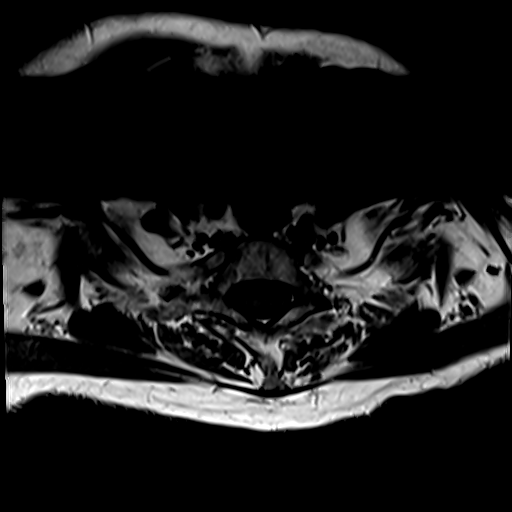

[Series 26: T2 post-contrast · sagittal · 3.0mm · 0.69mm/px · 1 of 13 slices shown]
[im 1/13]
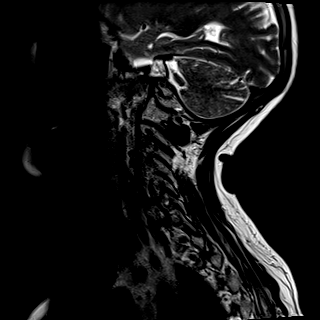

[Series 27: T1 fat-sat post-contrast · sagittal · 3.0mm · 0.69mm/px · 1 of 13 slices shown (1 of 2)]
[im 1/13]
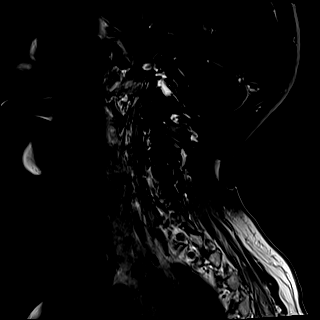

[Series 28: T1 post-contrast · axial · 3.0mm · 0.35mm/px · 1 of 30 slices shown (1 of 5)]
[im 1/30]
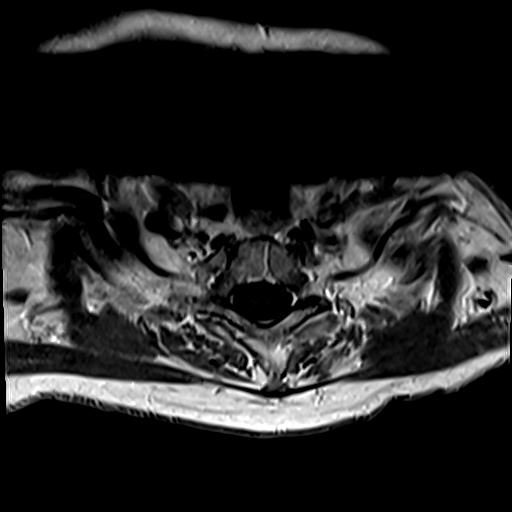

[Series 29: T1 fat-sat post-contrast · sagittal · 3.0mm · 0.69mm/px · 1 of 13 slices shown (2 of 2)]
[im 1/13]
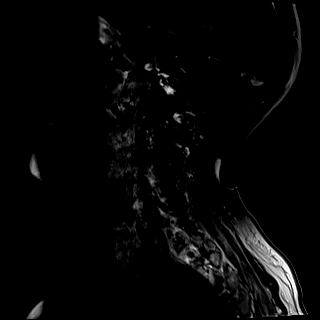

[Series 30: T1 post-contrast · axial · 1.0mm · 0.94mm/px · z∈[-23,+117]mm · 7 of 144 slices shown (2 of 5)]
[im 1/144]
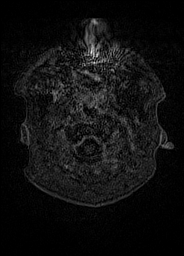
[im 24/144]
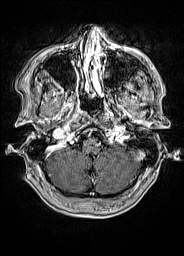
[im 48/144]
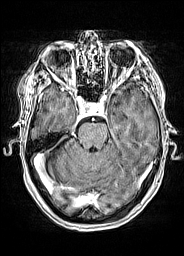
[im 72/144]
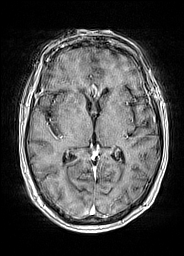
[im 96/144]
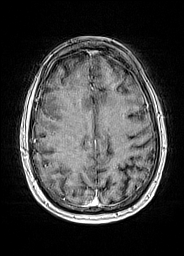
[im 120/144]
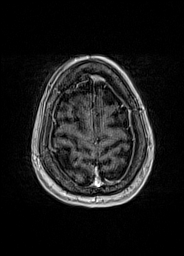
[im 144/144]
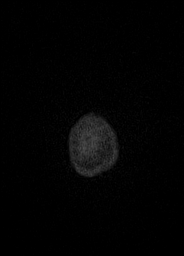

[Series 31: T1 post-contrast · sagittal · 5.0mm · 0.75mm/px · 1 of 24 slices shown (3 of 5)]
[im 1/24]
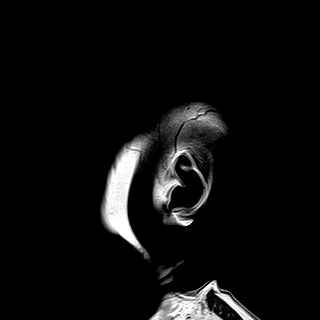

[Series 32: T1 post-contrast · coronal · 5.0mm · 0.43mm/px · 1 of 29 slices shown (4 of 5)]
[im 1/29]
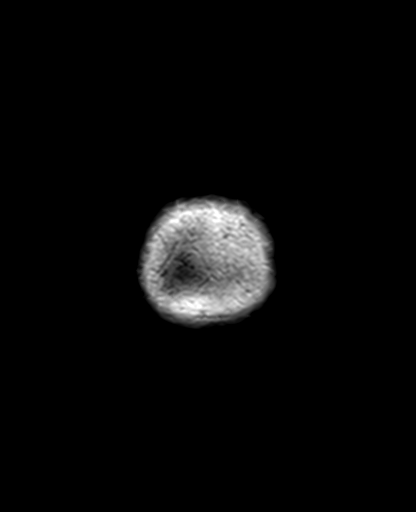

[Series 33: T1 post-contrast · axial · 3.0mm · 0.45mm/px · z∈[-28,+119]mm · 2 of 51 slices shown (5 of 5)]
[im 1/51]
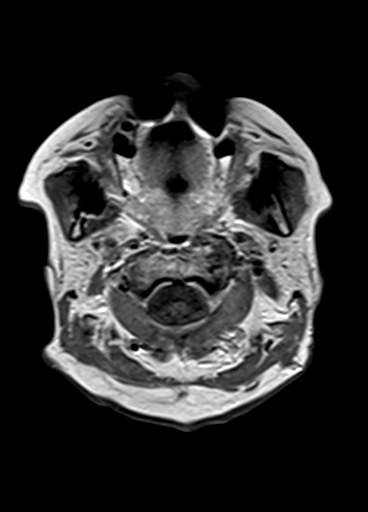
[im 51/51]
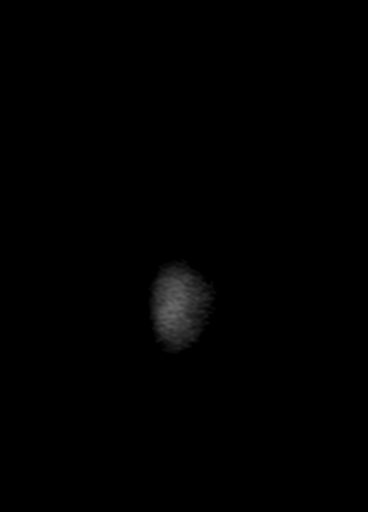

[48 of 48 positions shown; findings below may reference images not displayed]

FINDINGS: Mild intermittent motion degradation.

Alignment: Straightening of the expected cervical lordosis. Trace
C4-C5 grade 1 anterolisthesis. Trace grade 1 anterolisthesis at the
C7-T1, T1-T2 and T2-T3 levels.

Vertebrae: Vertebral body height is maintained. Nonspecific 7 mm
sclerotic focus within the right aspect of the C4 vertebral body.
Trace degenerative endplate edema and enhancement at C5-C6 and
C6-C7. Mild marrow edema and enhancement within the dens, which may
also be degenerative. However, sequela of an inflammatory
arthropathy cannot be excluded.

Cord: No spinal cord signal abnormality is identified. No abnormal
spinal cord enhancement.

Posterior Fossa, vertebral arteries, paraspinal tissues: Posterior
fossa better assessed on same-day brain MRI. Flow voids preserved
within the imaged cervical vertebral arteries. Paraspinal soft
tissues unremarkable.

Disc levels:

Multilevel disc degeneration, greatest at C5-C6 and C6-C7 (moderate
to moderately advanced at these levels).

C2-C3: Mild bilateral uncovertebral hypertrophy. Mild to moderate
facet arthrosis. No significant disc herniation or stenosis.

C3-C4: Posterior disc osteophyte complex with bilateral disc
osteophyte ridge/uncinate hypertrophy. Moderate facet arthrosis on
the left. Minimal partial effacement of the ventral thecal sac
(without spinal cord mass effect). Bilateral neural foraminal
narrowing (mild right, mild/moderate left).

C4-C5: Trace grade 1 anterolisthesis. Slight disc uncovering and
disc bulge. Uncovertebral hypertrophy (greater on the left).
Moderate facet arthrosis on the left. No significant spinal canal
stenosis. Mild/moderate left neural foraminal narrowing

C5-C6: Posterior disc osteophyte complex with bilateral disc
osteophyte ridge/uncinate hypertrophy. Mild facet arthrosis on the
left. Mild ligamentum flavum buckling. Mild spinal canal stenosis.
The posterior disc osteophyte complex contacts the ventral spinal
cord. Severe bilateral neural foraminal narrowing.

C6-C7: Posterior disc osteophyte complex with bilateral disc
osteophyte ridge/uncinate hypertrophy. Minimal facet arthrosis and
ligamentum flavum buckling. Minimal partial effacement of the
ventral thecal sac (without spinal cord mass effect). Mild left
neural foraminal narrowing.

C7-T1: Trace grade 1 anterolisthesis. Slight disc uncovering.
Minimal facet arthrosis. No significant spinal canal or foraminal
stenosis.
IMPRESSION: Mildly motion degraded exam.

Cervical spondylosis, as outlined and with findings most notably as
follows.

At C5-C6, there is moderate to moderately advanced disc
degeneration. Posterior disc osteophyte complex with bilateral disc
osteophyte ridge/uncinate hypertrophy. Mild facet arthrosis and
ligamentum flavum buckling. Mild spinal canal stenosis with contact
upon the ventral spinal cord. Severe bilateral neural foraminal
narrowing.

At C6-C7, there is moderate to moderately advanced disc
degeneration. Posterior disc osteophyte complex with bilateral disc
osteophyte ridge/uncinate hypertrophy. Minimal facet arthrosis and
ligamentum flavum hypertrophy. Minimal relative spinal canal
narrowing. Mild left neural foraminal narrowing.

No more than mild relative spinal canal narrowing at the remaining
levels. Additional sites of foraminal stenosis, as detailed and
greatest on the left at C3-C4 (mild/moderate) and on the left at
C4-C5 (mild/moderate).

Mild degenerative endplate edema and enhancement at C5-C6 and C6-C7.

Mild edema and enhancement within the dens. This may also be
degenerative. However, sequela of an inflammatory arthropathy cannot
be excluded.

Nonspecific 7 mm sclerotic focus within the right aspect of the C4
vertebral body.

No cervical spinal cord signal abnormality or abnormal cord
enhancement.

## 2020-11-17 IMAGING — MR MR HEAD WO/W CM
24 series · 48 of 48 positions shown · IV contrast (gadavist)
Comparison: Report from brain MRI [DATE] (images unavailable).
Head CT [DATE].

CLINICAL DATA: Right sided weakness [W3] ([W3]-CM). Neuro
deficit, acute, stroke suspected; right sided weakness; numbness.
Lower extremity numbness [W3] ([W3]-CM). Additional history
provided by scanning technologist: Right-sided weakness, myelopathy,
neck pain, difficulty ambulating.

EXAM:
MRI HEAD WITHOUT AND WITH CONTRAST
TECHNIQUE: Multiplanar, multiecho pulse sequences of the brain and surrounding
structures were obtained without and with intravenous contrast.
CONTRAST:  6mL GADAVIST GADOBUTROL 1 MMOL/ML IV SOLN

[Series 5: DWI · axial · 3.0mm · 1.36mm/px · z∈[-35,+110]mm · 5 of 100 slices shown (1 of 2)]
[im 1/100]
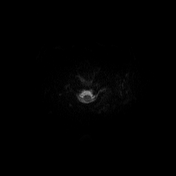
[im 25/100]
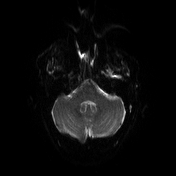
[im 50/100]
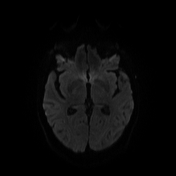
[im 75/100]
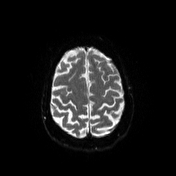
[im 100/100]
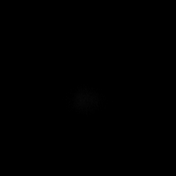

[Series 6: DWI · axial · 3.0mm · 1.36mm/px · z∈[-35,+110]mm · 2 of 50 slices shown (2 of 2)]
[im 1/50]
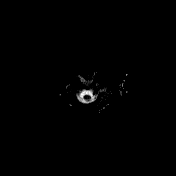
[im 50/50]
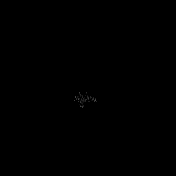

[Series 7: T1 · sagittal · 5.0mm · 0.75mm/px · 1 of 24 slices shown (1 of 5)]
[im 1/24]
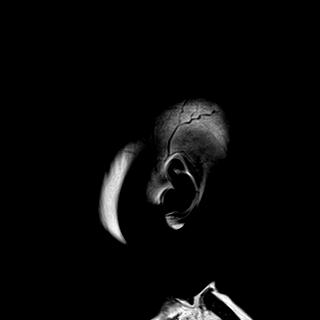

[Series 8: T2 · axial · 5.0mm · 0.62mm/px · 1 of 23 slices shown (1 of 3)]
[im 1/23]
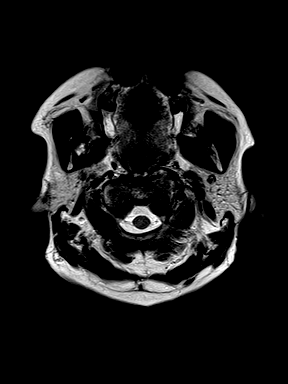

[Series 9: swi_images · axial · 3.0mm · 0.75mm/px · z∈[-29,+121]mm · 2 of 52 slices shown]
[im 1/52]
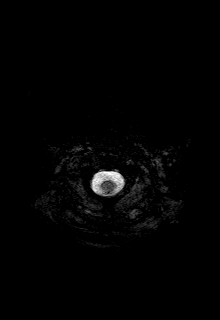
[im 52/52]
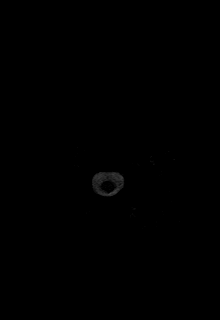

[Series 11: FLAIR · axial · 3.0mm · 0.75mm/px · z∈[-24,+115]mm · 2 of 48 slices shown]
[im 1/48]
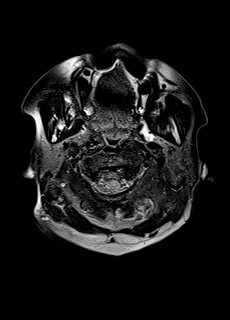
[im 48/48]
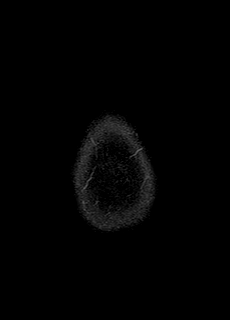

[Series 12: T1 · sagittal · 5.0mm · 0.75mm/px · 1 of 22 slices shown (2 of 5)]
[im 1/22]
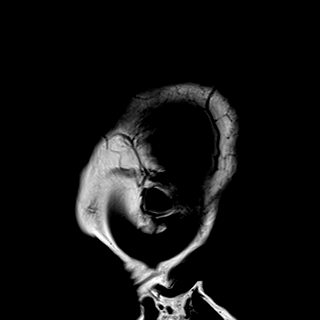

[Series 13: T1 · axial · 1.0mm · 0.94mm/px · z∈[-29,+127]mm · 8 of 160 slices shown (3 of 5)]
[im 1/160]
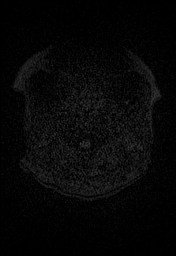
[im 23/160]
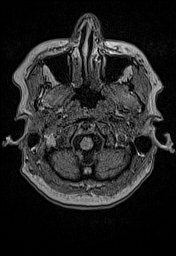
[im 46/160]
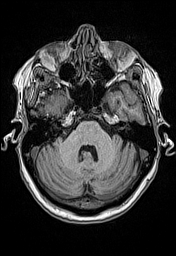
[im 69/160]
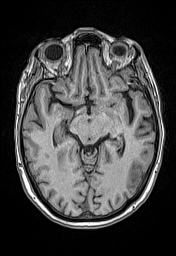
[im 91/160]
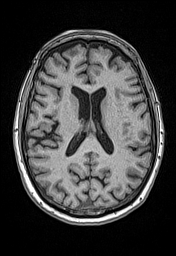
[im 114/160]
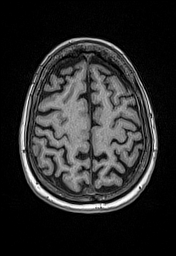
[im 137/160]
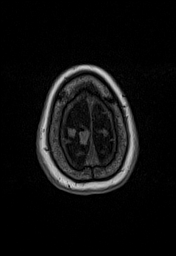
[im 160/160]
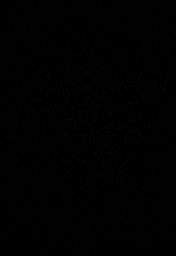

[Series 14: cor dwi_tracew · coronal · 5.0mm · 1.53mm/px · 3 of 54 slices shown]
[im 1/54]
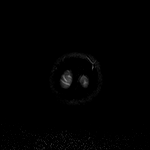
[im 27/54]
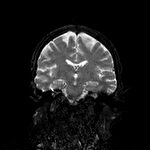
[im 54/54]
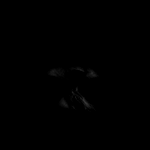

[Series 15: cor dwi_adc · coronal · 5.0mm · 1.53mm/px · 1 of 27 slices shown]
[im 1/27]
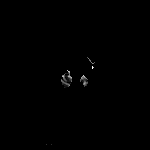

[Series 16: T2 · coronal · 5.0mm · 0.57mm/px · 2 of 32 slices shown (2 of 3)]
[im 1/32]
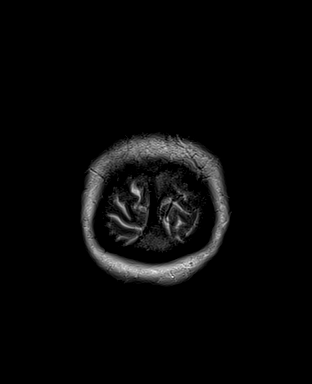
[im 32/32]
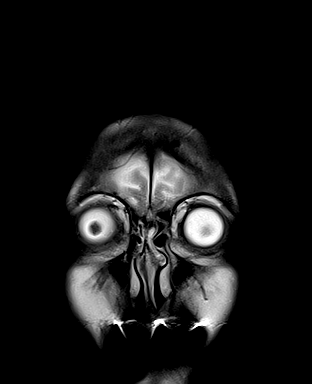

[Series 21: T1 · sagittal · 3.0mm · 0.69mm/px · 1 of 13 slices shown (4 of 5)]
[im 1/13]
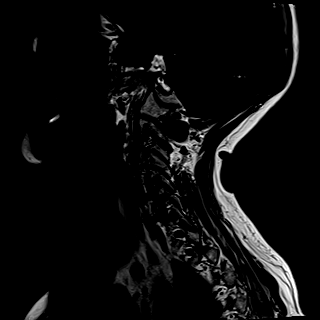

[Series 22: STIR · sagittal · 3.0mm · 0.86mm/px · 1 of 13 slices shown]
[im 1/13]
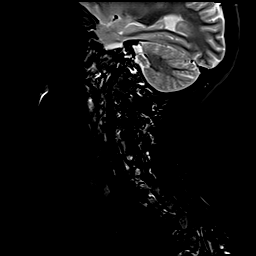

[Series 23: T2 · axial · 3.0mm · 0.70mm/px · 1 of 29 slices shown (3 of 3)]
[im 1/29]
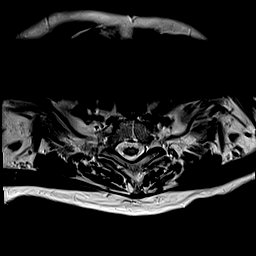

[Series 24: GRE · axial · 3.0mm · 0.35mm/px · 1 of 29 slices shown]
[im 1/29]
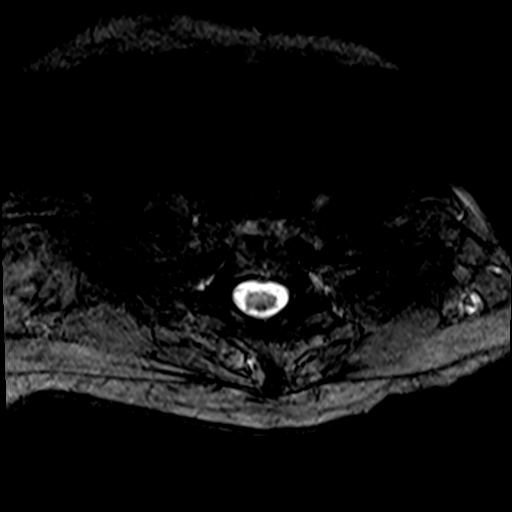

[Series 25: T1 · axial · 3.0mm · 0.35mm/px · 1 of 29 slices shown (5 of 5)]
[im 1/29]
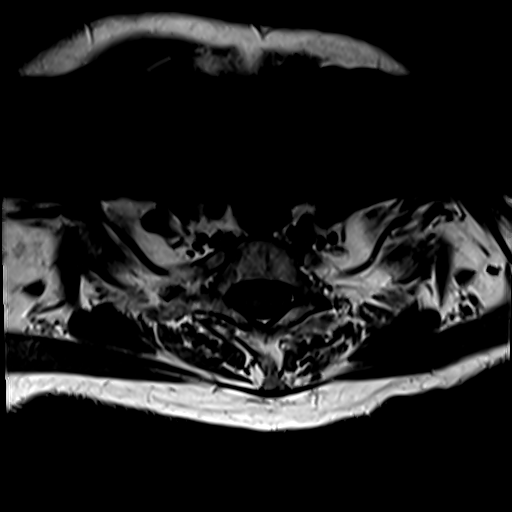

[Series 26: T2 post-contrast · sagittal · 3.0mm · 0.69mm/px · 1 of 13 slices shown]
[im 1/13]
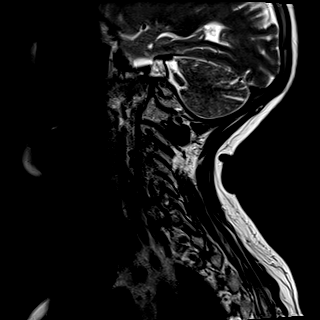

[Series 27: T1 fat-sat post-contrast · sagittal · 3.0mm · 0.69mm/px · 1 of 13 slices shown (1 of 2)]
[im 1/13]
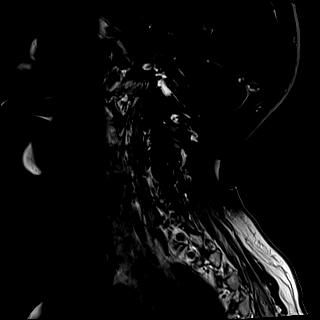

[Series 28: T1 post-contrast · axial · 3.0mm · 0.35mm/px · 1 of 30 slices shown (1 of 5)]
[im 1/30]
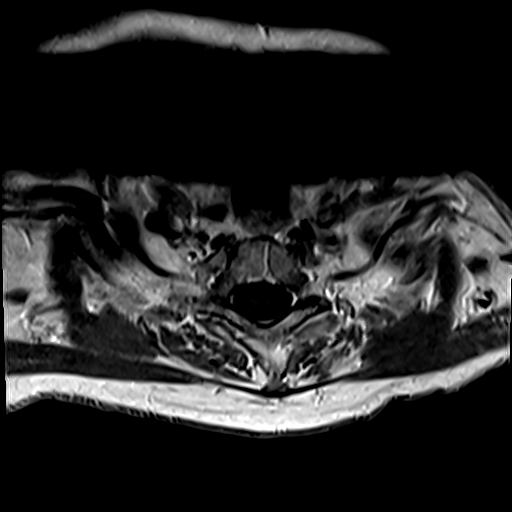

[Series 29: T1 fat-sat post-contrast · sagittal · 3.0mm · 0.69mm/px · 1 of 13 slices shown (2 of 2)]
[im 1/13]
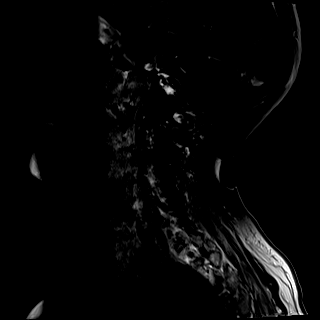

[Series 30: T1 post-contrast · axial · 1.0mm · 0.94mm/px · z∈[-23,+117]mm · 7 of 144 slices shown (2 of 5)]
[im 1/144]
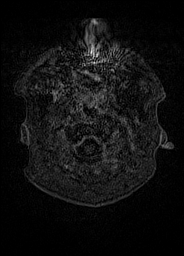
[im 24/144]
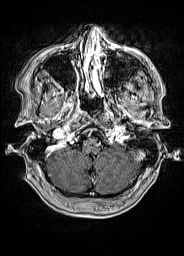
[im 48/144]
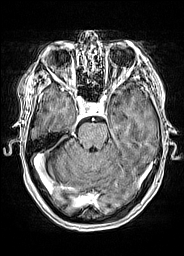
[im 72/144]
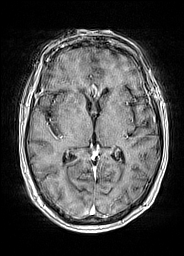
[im 96/144]
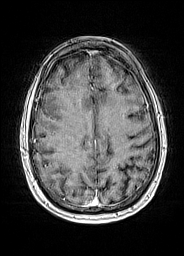
[im 120/144]
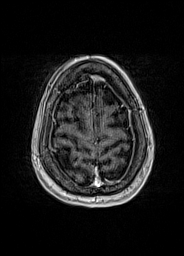
[im 144/144]
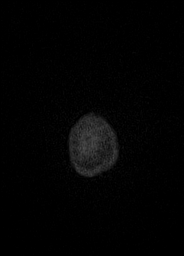

[Series 31: T1 post-contrast · sagittal · 5.0mm · 0.75mm/px · 1 of 24 slices shown (3 of 5)]
[im 1/24]
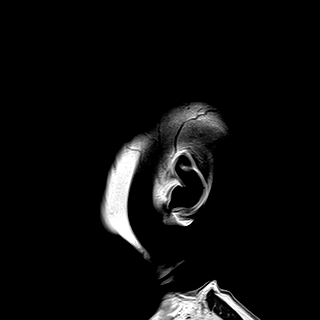

[Series 32: T1 post-contrast · coronal · 5.0mm · 0.43mm/px · 1 of 29 slices shown (4 of 5)]
[im 1/29]
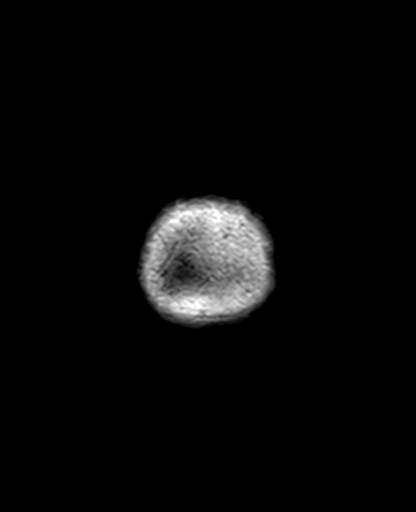

[Series 33: T1 post-contrast · axial · 3.0mm · 0.45mm/px · z∈[-28,+119]mm · 2 of 51 slices shown (5 of 5)]
[im 1/51]
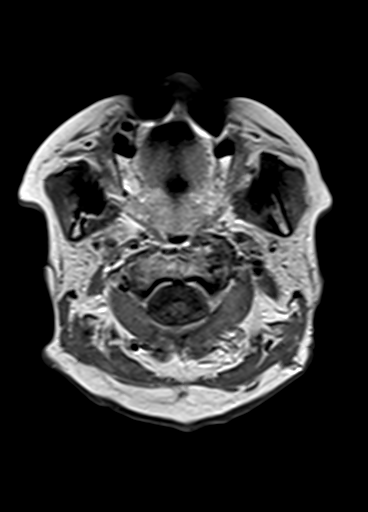
[im 51/51]
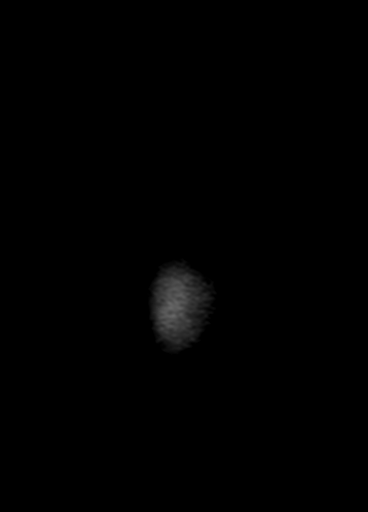

[48 of 48 positions shown; findings below may reference images not displayed]

FINDINGS: Brain:

Mild-to-moderate intermittent motion degradation. This includes
mild-to-moderate motion degradation of the axial T1 weighted
postcontrast sequence.

Cerebral volume is normal for age.

A few small scattered foci of T2/FLAIR hyperintensity within the
cerebral white matter are nonspecific, but compatible with minimal
changes of chronic small vessel ischemia.

There is no acute infarct.

No evidence of an intracranial mass.

No chronic intracranial blood products.

No extra-axial fluid collection.

No midline shift.

No pathologic intracranial enhancement is identified.

Vascular: Maintained flow voids within the proximal large arterial
vessels.

Skull and upper cervical spine: No focal suspicious marrow lesion.

Sinuses/Orbits: Visualized orbits show no acute finding. No
significant paranasal sinus disease.

Other: Trace fluid within the bilateral mastoid air cells.
IMPRESSION: Intermittently motion degraded examination, as described.

No evidence of acute intracranial abnormality.

Minimal chronic small-vessel ischemic changes within the cerebral
white matter.

Otherwise unremarkable MRI appearance of the brain for age.

Trace fluid within the bilateral mastoid air cells.

## 2020-11-17 MED ORDER — GADOBUTROL 1 MMOL/ML IV SOLN
6.0000 mL | Freq: Once | INTRAVENOUS | Status: AC | PRN
  Administered 2020-11-17: 6 mL via INTRAVENOUS

## 2020-11-19 ENCOUNTER — Telehealth: Payer: Self-pay | Admitting: Diagnostic Neuroimaging

## 2020-11-19 NOTE — Telephone Encounter (Signed)
Pt's husband called inquiring about wife's MRI results. Pt's husband requesting a call back.

## 2020-11-20 NOTE — Telephone Encounter (Signed)
Called husband and informed him Dr Leta Baptist stated the MRI cervical spine showed multilevel degenerative changes.  these can contribute to neck pain and bilateral arm pain.  There's no spinal cord compression or lesions.  No explanation for gait difficulty. Her MRI brain scan was an unremarkable study. No major findings. The next step is EMG/NCS. Answered his questions to stated satisfaction. He  verbalized understanding, appreciation.

## 2020-11-23 ENCOUNTER — Other Ambulatory Visit: Payer: Medicare Other

## 2020-11-26 ENCOUNTER — Ambulatory Visit (INDEPENDENT_AMBULATORY_CARE_PROVIDER_SITE_OTHER): Payer: Medicare Other | Admitting: Diagnostic Neuroimaging

## 2020-11-26 ENCOUNTER — Other Ambulatory Visit: Payer: Self-pay

## 2020-11-26 ENCOUNTER — Encounter: Payer: Medicare Other | Admitting: Diagnostic Neuroimaging

## 2020-11-26 DIAGNOSIS — Z0289 Encounter for other administrative examinations: Secondary | ICD-10-CM

## 2020-11-26 DIAGNOSIS — R531 Weakness: Secondary | ICD-10-CM

## 2020-11-26 DIAGNOSIS — R200 Anesthesia of skin: Secondary | ICD-10-CM

## 2020-11-26 NOTE — Procedures (Signed)
[facility]  NCS (NERVE CONDUCTION STUDY) WITH EMG (ELECTROMYOGRAPHY) REPORT   STUDY DATE: 11/26/20 PATIENT NAME: Brandi Villarreal DOB: [birth year] MRN: [id number]  ORDERING CLINICIAN: .Andrey Spearman, MD   TECHNOLOGIST: Sherre Scarlet ELECTROMYOGRAPHER: Earlean Polka. Teancum Brule, MD  CLINICAL INFORMATION: 74 year old female with right leg pain and weakness; bilateral foot pain and numbness.  FINDINGS: NERVE CONDUCTION STUDY:  Right median, right ulnar, right peroneal and right tibial motor responses are normal.  Right median, right ulnar, right sural and right superficial peroneal sensory responses are normal.  Right tibial and right ulnar F-wave latencies are normal.   NEEDLE ELECTROMYOGRAPHY:  Needle examination of right lower extremity is normal: vastus medialis, tibialis anterior, gastrocnemius.    IMPRESSION:   Normal study.  No electrodiagnostic evidence of large fiber neuropathy at this time.    INTERPRETING PHYSICIAN:  Penni Bombard, MD Certified in Neurology, Neurophysiology and Neuroimaging  [facility] [address] [place], [place] [address] [phone]   [facility]    Nerve / Sites Muscle Latency Ref. Amplitude Ref. Rel Amp Segments Distance Velocity Ref. Area    ms ms mV mV %  cm m/s m/s mVms  R Median - APB     Wrist APB 2.6 ?4.4 7.6 ?4.0 100 Wrist - APB 7   21.6     Upper arm APB 6.0  7.4  96.7 Upper arm - Wrist 20 59 ?49 22.1  R Ulnar - ADM     Wrist ADM 2.2 ?3.3 8.5 ?6.0 100 Wrist - ADM 7   22.8     B.Elbow ADM 4.9  7.4  86.5 B.Elbow - Wrist 19 71 ?49 22.2     A.Elbow ADM 6.3  7.3  98.7 A.Elbow - B.Elbow 10 71 ?49 20.8  R Peroneal - EDB     Ankle EDB 4.7 ?6.5 4.6 ?2.0 100 Ankle - EDB 9   19.5     Fib head EDB 10.1  4.3  93.9 Fib head - Ankle 25 46 ?44 21.5     Pop fossa EDB 12.3  4.2  96.9 Pop fossa - Fib head 10 46 ?44 20.4         Pop fossa - Ankle      R Tibial - AH     Ankle AH 3.7 ?5.8 11.3  ?4.0 100 Ankle - AH 9   36.7     Pop fossa AH 12.2  9.1  80.1 Pop fossa - Ankle 35 41 ?41 34.0             SNC    Nerve / Sites Rec. Site Peak Lat Ref.  Amp Ref. Segments Distance    ms ms V V  cm  R Sural - Ankle (Calf)     Calf Ankle 3.9 ?4.4 16 ?6 Calf - Ankle 14  R Superficial peroneal - Ankle     Lat leg Ankle 4.1 ?4.4 8 ?6 Lat leg - Ankle 14  R Median - Orthodromic (Dig II, Mid palm)     Dig II Wrist 2.8 ?3.4 15 ?10 Dig II - Wrist 13  R Ulnar - Orthodromic, (Dig V, Mid palm)     Dig V Wrist 2.3 ?3.1 15 ?5 Dig V - Wrist 49             F  Wave    Nerve F Lat Ref.   ms ms  R Tibial - AH 51.7 ?56.0  R Ulnar - ADM 23.9 ?32.0  EMG Summary Table    Spontaneous MUAP Recruitment  Muscle IA Fib PSW Fasc Other Amp Dur. Poly Pattern  R. Vastus medialis Normal None None None _______ Normal Normal Normal Normal  R. Tibialis anterior Normal None None None _______ Normal Normal Normal Normal  R. Gastrocnemius (Medial head) Normal None None None _______ Normal Normal Normal Normal

## 2020-12-01 ENCOUNTER — Telehealth: Payer: Self-pay | Admitting: Diagnostic Neuroimaging

## 2020-12-01 ENCOUNTER — Encounter: Payer: Self-pay | Admitting: *Deleted

## 2020-12-01 DIAGNOSIS — G959 Disease of spinal cord, unspecified: Secondary | ICD-10-CM

## 2020-12-01 DIAGNOSIS — M6281 Muscle weakness (generalized): Secondary | ICD-10-CM | POA: Diagnosis not present

## 2020-12-01 DIAGNOSIS — R531 Weakness: Secondary | ICD-10-CM

## 2020-12-01 DIAGNOSIS — M7062 Trochanteric bursitis, left hip: Secondary | ICD-10-CM | POA: Diagnosis not present

## 2020-12-01 DIAGNOSIS — R200 Anesthesia of skin: Secondary | ICD-10-CM

## 2020-12-01 NOTE — Telephone Encounter (Signed)
Pt's husband called stating that he would like to know who the provider would recommend for PT. Please advise.

## 2020-12-01 NOTE — Telephone Encounter (Signed)
Per Dr Leta Baptist, ordered PT for patient. Called husband and advised him of referral, sent my chart with [facility] # and advised he can call in several days if they haven't gotten call. He verbalized understanding, appreciation.

## 2020-12-03 ENCOUNTER — Encounter: Payer: Self-pay | Admitting: Physical Therapy

## 2020-12-03 ENCOUNTER — Ambulatory Visit: Payer: Medicare Other | Attending: Diagnostic Neuroimaging | Admitting: Physical Therapy

## 2020-12-03 ENCOUNTER — Other Ambulatory Visit: Payer: Self-pay

## 2020-12-03 DIAGNOSIS — R262 Difficulty in walking, not elsewhere classified: Secondary | ICD-10-CM | POA: Insufficient documentation

## 2020-12-03 DIAGNOSIS — M25552 Pain in left hip: Secondary | ICD-10-CM | POA: Insufficient documentation

## 2020-12-03 DIAGNOSIS — R2681 Unsteadiness on feet: Secondary | ICD-10-CM | POA: Diagnosis not present

## 2020-12-03 DIAGNOSIS — M6281 Muscle weakness (generalized): Secondary | ICD-10-CM | POA: Insufficient documentation

## 2020-12-03 DIAGNOSIS — R2689 Other abnormalities of gait and mobility: Secondary | ICD-10-CM | POA: Diagnosis not present

## 2020-12-03 NOTE — Therapy (Addendum)
[facility] [address] [place], [place], [address] Phone: [phone]   Fax:  [phone]  Physical Therapy Evaluation  Patient Details  Name: Brandi Villarreal MRN: [id number] Date of Birth: [birth year] Referring Provider (PT): Dr. Leta Baptist   Encounter Date: 12/03/2020   PT End of Session - 12/03/20 1212     Visit Number 1    Number of Visits 17    Date for PT Re-Evaluation 03/03/21    Authorization Type [employer] Medicare - will need 10th visit PN    PT Start Time 0847    PT Stop Time 0930    PT Time Calculation (min) 43 min    Equipment Utilized During Treatment Gait belt    Activity Tolerance Patient tolerated treatment well;Patient limited by pain    Behavior During Therapy WFL for tasks assessed/performed             Past Medical History:  Diagnosis Date   Cholelithiasis    rare symptoms, declined surgery   Colitis ?1975   Hemorrhoids    Lichen sclerosus    sees gyn for management   Mitral valve prolapse    Osteoporosis    sees gyn   Right leg weakness    Varicose veins     Past Surgical History:  Procedure Laterality Date   jaw resection     RHINOPLASTY     TONSILLECTOMY     TUBAL LIGATION     bilteral    There were no vitals filed for this visit.    Subjective Assessment - 12/03/20 0850     Subjective Had sudden onset R leg numbness, weakness and pain on 10/24/20 - began in her lower leg and went up to her hip and then was unable to walk and have any control of her leg. Was assessed for a CVA and was negative. Has seen Dr. Leta Baptist and MRI brain was unremarkable and cervical spine MRI showed multilevel degenerative changes. NCS study was normal. No spinal cord compression or lesions. Has been walking a tiny bit with the RW (50') 1 or 2 times a day in the last couple of days. Primarily in a w/c.  Got a shot in her L hip for trochanteric bursitis last thursday and reports that she has been moving  better since then. Still has tingling below B knees. Pt's husband has been helping with everything. Prior was ambulating with no AD - would walk several times a week for 1-2 miles.    Patient is accompained by: Family member   pt's husband, lynn   Pertinent History PMH: osteoporosis    Limitations Walking;Standing;House hold activities    How long can you stand comfortably? about 5 minutes    How long can you walk comfortably? approx. 81' with RW    Diagnostic tests MRI of the lumbar spine which showed mild to moderate spinal stenosis at L4-5 and some other degenerative changes. NCS on 11/26/20: Normal study.  No electrodiagnostic evidence of large fiber neuropathy at this time. MRI cervical spine showed multilevel degenerative changes.  MRI brain was unremarkable.    Patient Stated Goals wants to get back to normal    Currently in Pain? No/denies                [facility] PT Assessment - 12/03/20 0901       Assessment   Medical Diagnosis R leg weakness    Referring Provider (PT) Dr. Leta Baptist    Onset Date/Surgical Date  10/24/20    Hand Dominance Right    Prior Therapy previous PT for a R broken ankle (5-6 yrs ago)      Precautions   Precautions Fall    Precaution Comments osteoporosis      Balance Screen   Has the patient fallen in the past 6 months No    Has the patient had a decrease in activity level because of a fear of falling?  No    Is the patient reluctant to leave their home because of a fear of falling?  No      Home Environment   Living Environment Private residence    Living Arrangements Spouse/significant other    Type of [place] to enter    Entrance Stairs-Number of Steps 2   in front, 5 or 6 in back   Entrance Stairs-Rails None;Right   in front, railing in back and there is a wall   Home Layout Two level    Alternate Level Stairs-Number of Steps 12    Alternate Level Stairs-Rails Right    Home Equipment Wheelchair - manual;Walker -  standard;Other (comment)   has an exercise room upstairs   Additional Comments had to move bed down to living room, made ramp in front of outdoor steps, has been taking a bath once a week(either at their house or their beach house) - full baths are upstairs. husband has been assisting her get up and down stairs when needed      Prior Function   Level of Independence Independent    Vocation Retired    Leisure likes to ride her bike. has a beach house in [place] Impaired Detail    Proprioception Appears Intact   at B ankles   Additional Comments able to detect, incr B tingling below knees and also bottom of feet      Coordination   Gross Motor Movements are Fluid and Coordinated Yes      ROM / Strength   AROM / PROM / Strength Strength      Strength   Overall Strength Comments hip ABD/ADD tested in seated    Strength Assessment Site Knee;Hip;Ankle    Right/Left Hip Right;Left    Right Hip Flexion 4/5    Left Hip Flexion 5/5    Right/Left Knee Left;Right    Right Knee Flexion 3+/5    Right Knee Extension 4-/5    Left Knee Flexion 5/5    Left Knee Extension 5/5    Right/Left Ankle Left;Right    Right Ankle Dorsiflexion 4+/5    Left Ankle Dorsiflexion 5/5      Transfers   Transfers Stand to Sit;Sit to Stand    Sit to Stand 5: Supervision;With upper extremity assist;4: Min guard    Sit to Stand Details (indicate cue type and reason) performed from manual w/c and standing to RW. cues for UE placement    Five time sit to stand comments  18.12    Stand to Sit 5: Supervision;With upper extremity assist      Ambulation/Gait   Ambulation/Gait Yes    Ambulation/Gait Assistance 4: Min guard    Ambulation Distance (Feet) 50 Feet    Assistive device Rolling walker    Gait Pattern Step-to pattern;Step-through pattern;Decreased stance time - right;Decreased step length - left;Decreased hip/knee flexion - right;Decreased weight shift to right;Antalgic     Ambulation Surface Level;Indoor  Gait velocity 56.69 seconds = .58 ft/sec    Gait Comments pt's husband following with manual w/c. limited distance due to L trochanteric bursitis pain, needing to stop in standing 3 times due to pain                        Objective measurements completed on examination: See above findings.               PT Education - 12/03/20 1212     Education Details clinical findings, POC    Person(s) Educated Patient;Spouse    Methods Explanation    Comprehension Verbalized understanding              PT Short Term Goals - 12/03/20 2035       PT SHORT TERM GOAL #1   Title Pt and pt's spouse will be independent with initial HEP in order to build upon functional gains made in therapy. ALL STGS DUE 12/31/20    Time 4    Period Weeks    Status New    Target Date 12/31/20      PT SHORT TERM GOAL #2   Title Pt will undergo TUG with RW and LTG written in order to demo decr fall risk.    Time 4    Period Weeks    Status New      PT SHORT TERM GOAL #3   Title Pt will improve gait speed with RW to at least 1.0 ft/sec in order to demo improved gait efficiency/decr fall risk.    Baseline .58 ft/sec    Time 4    Period Weeks    Status New      PT SHORT TERM GOAL #4   Title Pt will ambulate at least 43' with RW and supervision over level indoor surfaces in order to demo improved household mobility.    Time 4    Period Weeks    Status New               PT Long Term Goals - 12/03/20 2037       PT LONG TERM GOAL #1   Title Pt and pt's spouse will be independent with final HEP in order to build upon functional gains made in therapy. ALL LTGS DUE 01/28/21    Time 8    Period Weeks    Status New    Target Date 01/28/21      PT LONG TERM GOAL #2   Title TUG goal to be written as appropriate in order to demo decr fall risk.    Time 8    Period Weeks    Status New      PT LONG TERM GOAL #3   Title Pt will perform 5x  sit <> stand in 15 seconds or less in order to demo improved functional BLE strength.    Baseline 18.12 seconds    Time 8    Period Weeks    Status New      PT LONG TERM GOAL #4   Title Pt will improve gait speed with RW to at least 2.0 ft/sec in order to demo improved gait efficiency.    Baseline .58 ft/sec    Time 8    Period Weeks    Status New      PT LONG TERM GOAL #5   Title Pt will ambulate at least 75' with supervision and RW vs. LRAD in order to demo improved gait  efficiency.    Baseline 50' with RW and min guard    Time 8    Period Weeks    Status New      Additional Long Term Goals   Additional Long Term Goals Yes      PT LONG TERM GOAL #6   Title Pt will go up and down 8 steps with bilat vs. single handrail with step to pattern and supervision in order to safely go up the stairs in her house.    Baseline not yet performed.    Time 8    Period Weeks    Status New                    Plan - 12/03/20 2040     Clinical Impression Statement Patient is a 75 year old female referred to Neuro OPPT for RLE weakness. Pt with sudden onset right side leg numbness, weakness and pain starting on 10/24/2020 and was unable to walk. Pt has seen Dr. Leta Baptist, per his notes - MRI of the brain was unremarkable, MRI of the lumbar spine which showed mild to moderate spinal stenosis at L4-5 and some other degenerative changes, NCS was normal, MRI of cervical spine showed multilevel degenerative changes. There's no spinal cord compression or lesions.  No explanation for gait difficulty. Pt recently saw an orthopedic physician and got a cortisone shot in her L hip for trochanteric bursitis. Pt has been able to walk more since this (approx. 53' at home with RW). Prior to sudden onset of weakess, pt was independent and ambulating with no AD. Pt's husband now has to help her with all ADLs. Pt's PMH is significant for: osteoporosis. The following deficits were present during the exam:  impaired sensation, postural abnormalities, decr strength (RLE>LLE), gait abnormalities, decr activity tolerance, impaired balance, L hip pain (notably during gait). Based on 5x sit <> stand and gait speed, pt is an incr risk for falls. Pt would benefit from skilled PT to address these impairments and functional limitations to maximize functional mobility independence    Personal Factors and Comorbidities Comorbidity 1;Time since onset of injury/illness/exacerbation;Past/Current Experience    Comorbidities osteoporosis    Examination-Activity Limitations Locomotion Level;Lift;Dressing;Hygiene/Grooming;Bathing;Reach Overhead;Transfers;Stand;Stairs    Examination-Participation Restrictions Community Activity;Driving;Cleaning;Laundry    Stability/Clinical Decision Making Evolving/Moderate complexity    Clinical Decision Making Moderate    Rehab Potential Good    PT Frequency 2x / week    PT Duration 12 weeks    PT Treatment/Interventions ADLs/Self Care Home Management;Aquatic Therapy;Gait training;DME Instruction;Stair training;Functional mobility training;Therapeutic activities;Neuromuscular re-education;Balance training;Therapeutic exercise;Patient/family education;Passive range of motion;Manual techniques;Energy conservation;Vestibular    PT Next Visit Plan initial HEP for RLE strengthening/ROM, standing balance. gait training with RW. Scifit/Nustep for strengthening.    Consulted and Agree with Plan of Care Patient;Family member/caregiver    Family Member Consulted pt's husband             Patient will benefit from skilled therapeutic intervention in order to improve the following deficits and impairments:  Abnormal gait, Decreased balance, Decreased activity tolerance, Decreased mobility, Difficulty walking, Decreased strength, Impaired sensation, Pain, Postural dysfunction  Visit Diagnosis: Unsteadiness on feet  Pain in left hip  Difficulty in walking, not elsewhere  classified  Muscle weakness (generalized)  Other abnormalities of gait and mobility     Problem List Patient Active Problem List   Diagnosis Date Noted   Histoplasmosis 08/21/2019   Chronic left shoulder pain 08/03/2017   Aortic atherosclerosis ([place]) 07/18/2017  Cholelithiasis 03/20/2012   Osteoporosis 08/09/2006    Arliss Journey, PT, DPT  12/03/2020, 8:49 PM  [facility] [address] [place], [place], [address] Phone: [phone]   Fax:  [phone]  Name: Brandi Villarreal MRN: [id number] Date of Birth: Apr 25, 1945

## 2020-12-03 NOTE — Addendum Note (Signed)
Addended by: Arliss Journey on: 12/03/2020 08:50 PM   Modules accepted: Orders

## 2020-12-08 ENCOUNTER — Ambulatory Visit: Payer: Medicare Other | Admitting: Physical Therapy

## 2020-12-11 ENCOUNTER — Other Ambulatory Visit: Payer: Self-pay

## 2020-12-11 ENCOUNTER — Ambulatory Visit: Payer: Medicare Other

## 2020-12-11 DIAGNOSIS — M6281 Muscle weakness (generalized): Secondary | ICD-10-CM

## 2020-12-11 DIAGNOSIS — R262 Difficulty in walking, not elsewhere classified: Secondary | ICD-10-CM | POA: Diagnosis not present

## 2020-12-11 DIAGNOSIS — R2681 Unsteadiness on feet: Secondary | ICD-10-CM | POA: Diagnosis not present

## 2020-12-11 DIAGNOSIS — R2689 Other abnormalities of gait and mobility: Secondary | ICD-10-CM

## 2020-12-11 DIAGNOSIS — M25552 Pain in left hip: Secondary | ICD-10-CM | POA: Diagnosis not present

## 2020-12-11 NOTE — Therapy (Signed)
[facility] [address] [place], [place], [address] Phone: [phone]   Fax:  [phone]  Physical Therapy Treatment  Patient Details  Name: GENISES SCADUTO MRN: [id number] Date of Birth: Jul 18, 1945 Referring Provider (PT): Dr. Leta Baptist   Encounter Date: 12/11/2020   PT End of Session - 12/11/20 0849     Visit Number 2    Number of Visits 17    Date for PT Re-Evaluation 03/03/21    Authorization Type UHC Medicare - will need 10th visit PN    PT Start Time 0845    PT Stop Time 0930    PT Time Calculation (min) 45 min    Activity Tolerance Patient tolerated treatment well;Patient limited by pain    Behavior During Therapy [facility] for tasks assessed/performed             Past Medical History:  Diagnosis Date   Cholelithiasis    rare symptoms, declined surgery   Colitis ?1975   Hemorrhoids    Lichen sclerosus    sees gyn for management   Mitral valve prolapse    Osteoporosis    sees gyn   Right leg weakness    Varicose veins     Past Surgical History:  Procedure Laterality Date   jaw resection     RHINOPLASTY     TONSILLECTOMY     TUBAL LIGATION     bilteral    There were no vitals filed for this visit.                      [facility] PT Treatment/Exercise - 12/11/20 0001       Transfers   Transfers Stand to Sit;Sit to Stand    Sit to Stand 5: Supervision    Sit to Stand Details (indicate cue type and reason) from WC/mat    Stand to Sit 5: Supervision;With upper extremity assist    Comments 5 reps STS with OH reach once standing      Lumbar Exercises: Stretches   Single Knee to Chest Stretch Right;30 seconds;3 reps    Single Knee to Chest Stretch Limitations hands to posterior knee    Piriformis Stretch 3 reps;30 seconds;Right      Lumbar Exercises: Supine   Bridge Non-compliant;10 reps    Bridge Limitations 2x10      Knee/Hip Exercises: Stretches   Piriformis  Stretch Right;3 reps;30 seconds      Knee/Hip Exercises: Supine   Other Supine Knee/Hip Exercises knee/hip fallouts 3x10      Manual Therapy   Manual therapy comments point tender to trochanteric bursitis and R piriformis fibers    Soft tissue mobilization piriformis release to R for 8' duration                       PT Short Term Goals - 12/11/20 [id number]       PT SHORT TERM GOAL #1   Title Pt and pt's spouse will be independent with initial HEP in order to build upon functional gains made in therapy. ALL STGS DUE 12/31/20    Baseline 12/11/20 [id number]    Time 4    Period Weeks    Status New    Target Date 12/31/20      PT SHORT TERM GOAL #2   Title Pt will undergo TUG with RW and LTG written in order to demo decr fall risk.    Time 4  Period Weeks    Status New      PT SHORT TERM GOAL #3   Title Pt will improve gait speed with RW to at least 1.0 ft/sec in order to demo improved gait efficiency/decr fall risk.    Baseline .58 ft/sec    Time 4    Period Weeks    Status New      PT SHORT TERM GOAL #4   Title Pt will ambulate at least 4' with RW and supervision over level indoor surfaces in order to demo improved household mobility.    Time 4    Period Weeks    Status New               PT Long Term Goals - 12/03/20 2037       PT LONG TERM GOAL #1   Title Pt and pt's spouse will be independent with final HEP in order to build upon functional gains made in therapy. ALL LTGS DUE 01/28/21    Time 8    Period Weeks    Status New    Target Date 01/28/21      PT LONG TERM GOAL #2   Title TUG goal to be written as appropriate in order to demo decr fall risk.    Time 8    Period Weeks    Status New      PT LONG TERM GOAL #3   Title Pt will perform 5x sit <> stand in 15 seconds or less in order to demo improved functional BLE strength.    Baseline 18.12 seconds    Time 8    Period Weeks    Status New      PT LONG TERM GOAL #4   Title Pt will  improve gait speed with RW to at least 2.0 ft/sec in order to demo improved gait efficiency.    Baseline .58 ft/sec    Time 8    Period Weeks    Status New      PT LONG TERM GOAL #5   Title Pt will ambulate at least 37' with supervision and RW vs. LRAD in order to demo improved gait efficiency.    Baseline 50' with RW and min guard    Time 8    Period Weeks    Status New      Additional Long Term Goals   Additional Long Term Goals Yes      PT LONG TERM GOAL #6   Title Pt will go up and down 8 steps with bilat vs. single handrail with step to pattern and supervision in order to safely go up the stairs in her house.    Baseline not yet performed.    Time 8    Period Weeks    Status New                   Plan - 12/11/20 0932     Clinical Impression Statement Todays session focused on symptom relief and establishment of a HEP.  Patient presents with tight and irritated R piriformis muscle which responded well to release techniques today.  Established and demoed a HEP for pirifomis and hip stretching followed by sngth and stabilizing exercises to pelvis and gluteus medius.  Able to ransfer sit/stand and stand pivot under S only    Personal Factors and Comorbidities Comorbidity 1;Time since onset of injury/illness/exacerbation;Past/Current Experience    Comorbidities osteoporosis    Examination-Activity Limitations Locomotion Level;Lift;Dressing;Hygiene/Grooming;Bathing;Reach Overhead;Transfers;Stand;Stairs    Examination-Participation  Restrictions Community Activity;Driving;Cleaning;Laundry    Stability/Clinical Decision Making Evolving/Moderate complexity    Rehab Potential Good    PT Frequency 2x / week    PT Duration 12 weeks    PT Treatment/Interventions ADLs/Self Care Home Management;Aquatic Therapy;Gait training;DME Instruction;Stair training;Functional mobility training;Therapeutic activities;Neuromuscular re-education;Balance training;Therapeutic  exercise;Patient/family education;Passive range of motion;Manual techniques;Energy conservation;Vestibular    PT Next Visit Plan standing balance. gait training with RW. Scifit/Nustep for strengthening. HEP f/u    PT Home Exercise Plan [id number]    Consulted and Agree with Plan of Care Patient;Family member/caregiver    Family Member Consulted pt's husband             Patient will benefit from skilled therapeutic intervention in order to improve the following deficits and impairments:  Abnormal gait, Decreased balance, Decreased activity tolerance, Decreased mobility, Difficulty walking, Decreased strength, Impaired sensation, Pain, Postural dysfunction  Visit Diagnosis: Unsteadiness on feet  Difficulty in walking, not elsewhere classified  Muscle weakness (generalized)  Other abnormalities of gait and mobility     Problem List Patient Active Problem List   Diagnosis Date Noted   Histoplasmosis 08/21/2019   Chronic left shoulder pain 08/03/2017   Aortic atherosclerosis ([place]) 07/18/2017   Cholelithiasis 03/20/2012   Osteoporosis 08/09/2006    Lanice Shirts, PT 12/11/2020, 9:41 AM  [facility] [address] [place], [place], [address] Phone: [phone]   Fax:  [phone]  Name: GIOCONDA DEHOFF MRN: [id number] Date of Birth: [birth year]

## 2020-12-11 NOTE — Patient Instructions (Signed)
Access Code: [email or web address] URL: [email or web address] Date: 12/11/2020 Prepared by: Sharlynn Oliphant  Exercises Hooklying Single Knee to Chest - 2 x daily - 7 x weekly - 1 sets - 3 reps - 30s hold Bent Knee Fallouts - 2 x daily - 7 x weekly - 3 sets - 10 reps Supine Piriformis Stretch with Foot on Ground - 2 x daily - 7 x weekly - 1 sets - 3 reps - 30s hold Supine Bridge - 2 x daily - 7 x weekly - 2 sets - 10 reps Clamshell - 2 x daily - 7 x weekly - 2 sets - 10 reps

## 2020-12-15 ENCOUNTER — Encounter: Payer: Self-pay | Admitting: Physical Therapy

## 2020-12-15 ENCOUNTER — Ambulatory Visit: Payer: Medicare Other | Admitting: Physical Therapy

## 2020-12-15 ENCOUNTER — Other Ambulatory Visit: Payer: Self-pay

## 2020-12-15 DIAGNOSIS — R2689 Other abnormalities of gait and mobility: Secondary | ICD-10-CM

## 2020-12-15 DIAGNOSIS — M6281 Muscle weakness (generalized): Secondary | ICD-10-CM | POA: Diagnosis not present

## 2020-12-15 DIAGNOSIS — R2681 Unsteadiness on feet: Secondary | ICD-10-CM

## 2020-12-15 DIAGNOSIS — R262 Difficulty in walking, not elsewhere classified: Secondary | ICD-10-CM

## 2020-12-15 DIAGNOSIS — M25552 Pain in left hip: Secondary | ICD-10-CM | POA: Diagnosis not present

## 2020-12-16 NOTE — Therapy (Signed)
[facility] [address] [place], [place], [address] Phone: [phone]   Fax:  [phone]  Physical Therapy Treatment  Patient Details  Name: Brandi Villarreal MRN: [id number] Date of Birth: October 28, 1945 Referring Provider (PT): Dr. Leta Baptist   Encounter Date: 12/15/2020   PT End of Session - 12/15/20 1541     Visit Number 3    Number of Visits 17    Date for PT Re-Evaluation 03/03/21    Authorization Type [employer] Medicare - will need 10th visit PN    PT Start Time 1535    PT Stop Time 1615    PT Time Calculation (min) 40 min    Equipment Utilized During Treatment Gait belt    Activity Tolerance Patient tolerated treatment well;Patient limited by pain    Behavior During Therapy WFL for tasks assessed/performed             Past Medical History:  Diagnosis Date   Cholelithiasis    rare symptoms, declined surgery   Colitis ?1975   Hemorrhoids    Lichen sclerosus    sees gyn for management   Mitral valve prolapse    Osteoporosis    sees gyn   Right leg weakness    Varicose veins     Past Surgical History:  Procedure Laterality Date   jaw resection     RHINOPLASTY     TONSILLECTOMY     TUBAL LIGATION     bilteral    There were no vitals filed for this visit.   Subjective Assessment - 12/15/20 1536     Subjective No new complaints. Hip feels better after manual therapy last session, not hurting this session. No falls. Has done the ex's twice since getting them last session.    Patient is accompained by: Family member   spouse, Brandi Villarreal   Pertinent History PMH: osteoporosis    Limitations Walking;Standing;House hold activities    How long can you stand comfortably? about 5 minutes    How long can you walk comfortably? approx. 66' with RW    Diagnostic tests MRI of the lumbar spine which showed mild to moderate spinal stenosis at L4-5 and some other degenerative changes. NCS on 11/26/20: Normal study.  No  electrodiagnostic evidence of large fiber neuropathy at this time. MRI cervical spine showed multilevel degenerative changes.  MRI brain was unremarkable.    Patient Stated Goals wants to get back to normal    Currently in Pain? No/denies                 [facility] Adult PT Treatment/Exercise - 12/15/20 1541       Transfers   Transfers Stand to Sit;Sit to Stand    Sit to Stand 5: Supervision    Stand to Sit 5: Supervision;With upper extremity assist      Ambulation/Gait   Ambulation/Gait Yes    Ambulation/Gait Assistance 4: Min guard    Ambulation Distance (Feet) 20 Feet   x2 reps   Assistive device Rolling walker    Gait Pattern Step-to pattern;Step-through pattern;Decreased stance time - right;Decreased step length - left;Decreased hip/knee flexion - right;Decreased weight shift to right;Antalgic    Ambulation Surface Level;Indoor      Knee/Hip Exercises: Aerobic   Other Aerobic Scifit UE/LE's level 2.0 x 8 minutes with goal 30-40 steps per minute for strengthening and activity tolerance      Knee/Hip Exercises: Supine   Bridges Limitations with yoga block between knees to maintain alignment  with cues on form and technique.    Bridges with [employer] AROM;Strengthening;Both;1 set;10 reps;Limitations    Other Supine Knee/Hip Exercises knee/hip fallouts x10 each side with PTA hand as target and cues for slow and controlled movements    Other Supine Knee/Hip Exercises hook lying with yoga block between knees- lower trunk rotation with block helping to maintain neutral alignment for 10 reps toward each side      Manual Therapy   Manual Therapy Soft tissue mobilization;Myofascial release    Soft tissue mobilization piriformis release to R for 1 minute duration x 3 reps.    Myofascial Release use of myofascial release ball to left IT band, glut and iliac creast area for decreased tightness and improved range of motion with pt reported improvement afterwards.                  PT Short Term Goals - 12/11/20 [id number]       PT SHORT TERM GOAL #1   Title Pt and pt's spouse will be independent with initial HEP in order to build upon functional gains made in therapy. ALL STGS DUE 12/31/20    Baseline 12/11/20 [id number]    Time 4    Period Weeks    Status New    Target Date 12/31/20      PT SHORT TERM GOAL #2   Title Pt will undergo TUG with RW and LTG written in order to demo decr fall risk.    Time 4    Period Weeks    Status New      PT SHORT TERM GOAL #3   Title Pt will improve gait speed with RW to at least 1.0 ft/sec in order to demo improved gait efficiency/decr fall risk.    Baseline .58 ft/sec    Time 4    Period Weeks    Status New      PT SHORT TERM GOAL #4   Title Pt will ambulate at least 51' with RW and supervision over level indoor surfaces in order to demo improved household mobility.    Time 4    Period Weeks    Status New               PT Long Term Goals - 12/03/20 2037       PT LONG TERM GOAL #1   Title Pt and pt's spouse will be independent with final HEP in order to build upon functional gains made in therapy. ALL LTGS DUE 01/28/21    Time 8    Period Weeks    Status New    Target Date 01/28/21      PT LONG TERM GOAL #2   Title TUG goal to be written as appropriate in order to demo decr fall risk.    Time 8    Period Weeks    Status New      PT LONG TERM GOAL #3   Title Pt will perform 5x sit <> stand in 15 seconds or less in order to demo improved functional BLE strength.    Baseline 18.12 seconds    Time 8    Period Weeks    Status New      PT LONG TERM GOAL #4   Title Pt will improve gait speed with RW to at least 2.0 ft/sec in order to demo improved gait efficiency.    Baseline .58 ft/sec    Time 8    Period Weeks    Status New  PT LONG TERM GOAL #5   Title Pt will ambulate at least 345' with supervision and RW vs. LRAD in order to demo improved gait efficiency.    Baseline 50' with RW and  min guard    Time 8    Period Weeks    Status New      Additional Long Term Goals   Additional Long Term Goals Yes      PT LONG TERM GOAL #6   Title Pt will go up and down 8 steps with bilat vs. single handrail with step to pattern and supervision in order to safely go up the stairs in her house.    Baseline not yet performed.    Time 8    Period Weeks    Status New                   Plan - 12/15/20 1541     Clinical Impression Statement Today's skilled session continued to focus on gait with RW, strengthening and use of manual therapy for decreased tightness and pain. No issues noted or reported in session with pt reported decreased muscle tightness at end of session. The pt is progressing and should benefit from continued PT to progres toward unmet goals.    Personal Factors and Comorbidities Comorbidity 1;Time since onset of injury/illness/exacerbation;Past/Current Experience    Comorbidities osteoporosis    Examination-Activity Limitations Locomotion Level;Lift;Dressing;Hygiene/Grooming;Bathing;Reach Overhead;Transfers;Stand;Stairs    Examination-Participation Restrictions Community Activity;Driving;Cleaning;Laundry    Stability/Clinical Decision Making Evolving/Moderate complexity    Rehab Potential Good    PT Frequency 2x / week    PT Duration 12 weeks    PT Treatment/Interventions ADLs/Self Care Home Management;Aquatic Therapy;Gait training;DME Instruction;Stair training;Functional mobility training;Therapeutic activities;Neuromuscular re-education;Balance training;Therapeutic exercise;Patient/family education;Passive range of motion;Manual techniques;Energy conservation;Vestibular    PT Next Visit Plan standing balance. gait training with RW. Scifit/Nustep for strengthening. HEP f/u    PT Home Exercise Plan [id number]    Consulted and Agree with Plan of Care Patient;Family member/caregiver    Family Member Consulted pt's husband             Patient will benefit  from skilled therapeutic intervention in order to improve the following deficits and impairments:  Abnormal gait, Decreased balance, Decreased activity tolerance, Decreased mobility, Difficulty walking, Decreased strength, Impaired sensation, Pain, Postural dysfunction  Visit Diagnosis: Unsteadiness on feet  Difficulty in walking, not elsewhere classified  Muscle weakness (generalized)  Other abnormalities of gait and mobility     Problem List Patient Active Problem List   Diagnosis Date Noted   Histoplasmosis 08/21/2019   Chronic left shoulder pain 08/03/2017   Aortic atherosclerosis ([place]) 07/18/2017   Cholelithiasis 03/20/2012   Osteoporosis 08/09/2006    Willow Ora, PTA, [facility] [address], [place], [place] [address] [phone] 12/16/20, 9:15 PM   Name: Brandi Villarreal MRN: [id number] Date of Birth: Nov 08, 1945

## 2020-12-18 ENCOUNTER — Ambulatory Visit: Payer: Medicare Other | Admitting: Physical Therapy

## 2020-12-18 ENCOUNTER — Other Ambulatory Visit: Payer: Self-pay

## 2020-12-18 ENCOUNTER — Encounter: Payer: Self-pay | Admitting: Physical Therapy

## 2020-12-18 DIAGNOSIS — R262 Difficulty in walking, not elsewhere classified: Secondary | ICD-10-CM

## 2020-12-18 DIAGNOSIS — M25552 Pain in left hip: Secondary | ICD-10-CM | POA: Diagnosis not present

## 2020-12-18 DIAGNOSIS — R2681 Unsteadiness on feet: Secondary | ICD-10-CM | POA: Diagnosis not present

## 2020-12-18 DIAGNOSIS — R2689 Other abnormalities of gait and mobility: Secondary | ICD-10-CM

## 2020-12-18 DIAGNOSIS — M6281 Muscle weakness (generalized): Secondary | ICD-10-CM | POA: Diagnosis not present

## 2020-12-19 NOTE — Therapy (Signed)
[facility] [address] [place], [place], [address] Phone: [phone]   Fax:  [phone]  Physical Therapy Treatment  Patient Details  Name: Brandi Villarreal MRN: [id number] Date of Birth: [birth year] Referring Provider (PT): Dr. Leta Baptist   Encounter Date: 12/18/2020   PT End of Session - 12/18/20 0857     Visit Number 4    Number of Visits 17    Date for PT Re-Evaluation 03/03/21    Authorization Type [employer] Medicare - will need 10th visit PN    PT Start Time [phone]   pt running late because daughter got lost   PT Stop Time 0930    PT Time Calculation (min) 37 min    Equipment Utilized During Treatment Gait belt    Activity Tolerance Patient tolerated treatment well;Patient limited by pain    Behavior During Therapy WFL for tasks assessed/performed             Past Medical History:  Diagnosis Date   Cholelithiasis    rare symptoms, declined surgery   Colitis ?1975   Hemorrhoids    Lichen sclerosus    sees gyn for management   Mitral valve prolapse    Osteoporosis    sees gyn   Right leg weakness    Varicose veins     Past Surgical History:  Procedure Laterality Date   jaw resection     RHINOPLASTY     TONSILLECTOMY     TUBAL LIGATION     bilteral    There were no vitals filed for this visit.   Subjective Assessment - 12/18/20 0855     Subjective No new complaints. No falls or pain. Daughter brought her today as Brandi Villarreal had hernia surgery yesterday.    Patient is accompained by: Family member   daughter dropped her off   Pertinent History PMH: osteoporosis    Limitations Walking;Standing;House hold activities    How long can you stand comfortably? about 5 minutes    How long can you walk comfortably? approx. 34' with RW    Diagnostic tests MRI of the lumbar spine which showed mild to moderate spinal stenosis at L4-5 and some other degenerative changes. NCS on 11/26/20: Normal study.  No  electrodiagnostic evidence of large fiber neuropathy at this time. MRI cervical spine showed multilevel degenerative changes.  MRI brain was unremarkable.    Patient Stated Goals wants to get back to normal    Currently in Pain? No/denies                     [facility] Adult PT Treatment/Exercise - 12/18/20 0858       Transfers   Transfers Stand to Sit;Sit to Stand    Sit to Stand 5: Supervision;With upper extremity assist;From bed;From chair/3-in-1    Stand to Sit 5: Supervision;With upper extremity assist      Ambulation/Gait   Ambulation/Gait Yes    Ambulation/Gait Assistance 4: Min guard    Ambulation Distance (Feet) 40 Feet   x2 reps   Assistive device Rolling walker    Gait Pattern Step-to pattern;Step-through pattern;Decreased stance time - right;Decreased step length - left;Decreased hip/knee flexion - right;Decreased weight shift to right;Antalgic    Ambulation Surface Level;Indoor      Self-Care   Self-Care Other Self-Care Comments    Other Self-Care Comments  pt educated on and performed self piriformis release with tennis ball in sitting and lying. ball given for use at home.  Neuro Re-ed    Neuro Re-ed Details  for balance/muscle re-ed: two foam bubbles on floor with single HHA- alternating forward, then cross foot taps with up to min assist, cues for stance position, posture, weight shifting and slow movements.      Exercises   Exercises Other Exercises    Other Exercises  with bil LE's seated at edge of mat table: with 2# ankle weights- heel<>toe raises, then long arc quads for 10 reps each/each side; with red band resistance: hamstring curls for 10 reps each side. cues for slow and controlled movements with all ex's performed.      Knee/Hip Exercises: Aerobic   Other Aerobic Scifit UE/LE's level 2.3 x 8 minutes with goal 30-40 steps per minute for strengthening and activity tolerance                       PT Short Term Goals - 12/11/20 [id number]        PT SHORT TERM GOAL #1   Title Pt and pt's spouse will be independent with initial HEP in order to build upon functional gains made in therapy. ALL STGS DUE 12/31/20    Baseline 12/11/20 [id number]    Time 4    Period Weeks    Status New    Target Date 12/31/20      PT SHORT TERM GOAL #2   Title Pt will undergo TUG with RW and LTG written in order to demo decr fall risk.    Time 4    Period Weeks    Status New      PT SHORT TERM GOAL #3   Title Pt will improve gait speed with RW to at least 1.0 ft/sec in order to demo improved gait efficiency/decr fall risk.    Baseline .58 ft/sec    Time 4    Period Weeks    Status New      PT SHORT TERM GOAL #4   Title Pt will ambulate at least 71' with RW and supervision over level indoor surfaces in order to demo improved household mobility.    Time 4    Period Weeks    Status New               PT Long Term Goals - 12/03/20 2037       PT LONG TERM GOAL #1   Title Pt and pt's spouse will be independent with final HEP in order to build upon functional gains made in therapy. ALL LTGS DUE 01/28/21    Time 8    Period Weeks    Status New    Target Date 01/28/21      PT LONG TERM GOAL #2   Title TUG goal to be written as appropriate in order to demo decr fall risk.    Time 8    Period Weeks    Status New      PT LONG TERM GOAL #3   Title Pt will perform 5x sit <> stand in 15 seconds or less in order to demo improved functional BLE strength.    Baseline 18.12 seconds    Time 8    Period Weeks    Status New      PT LONG TERM GOAL #4   Title Pt will improve gait speed with RW to at least 2.0 ft/sec in order to demo improved gait efficiency.    Baseline .58 ft/sec    Time 8    Period Weeks  Status New      PT LONG TERM GOAL #5   Title Pt will ambulate at least 75' with supervision and RW vs. LRAD in order to demo improved gait efficiency.    Baseline 50' with RW and min guard    Time 8    Period Weeks    Status New       Additional Long Term Goals   Additional Long Term Goals Yes      PT LONG TERM GOAL #6   Title Pt will go up and down 8 steps with bilat vs. single handrail with step to pattern and supervision in order to safely go up the stairs in her house.    Baseline not yet performed.    Time 8    Period Weeks    Status New                   Plan - 12/18/20 0857     Clinical Impression Statement Today's skilled session continued to focus on strengthening, gait with RW and began to address standing balance with decreased UE support. Rest breaks taken as needed with session. No other issues noted or reported in session. The pt is progressing toward goals and should benefit from continued PT to progress toward unmet goals.    Personal Factors and Comorbidities Comorbidity 1;Time since onset of injury/illness/exacerbation;Past/Current Experience    Comorbidities osteoporosis    Examination-Activity Limitations Locomotion Level;Lift;Dressing;Hygiene/Grooming;Bathing;Reach Overhead;Transfers;Stand;Stairs    Examination-Participation Restrictions Community Activity;Driving;Cleaning;Laundry    Stability/Clinical Decision Making Evolving/Moderate complexity    Rehab Potential Good    PT Frequency 2x / week    PT Duration 12 weeks    PT Treatment/Interventions ADLs/Self Care Home Management;Aquatic Therapy;Gait training;DME Instruction;Stair training;Functional mobility training;Therapeutic activities;Neuromuscular re-education;Balance training;Therapeutic exercise;Patient/family education;Passive range of motion;Manual techniques;Energy conservation;Vestibular    PT Next Visit Plan standing balance. gait training with RW. Scifit/Nustep for strengthening. HEP f/u    PT Home Exercise Plan [id number]    Consulted and Agree with Plan of Care Patient;Family member/caregiver    Family Member Consulted pt's husband             Patient will benefit from skilled therapeutic intervention in order  to improve the following deficits and impairments:  Abnormal gait, Decreased balance, Decreased activity tolerance, Decreased mobility, Difficulty walking, Decreased strength, Impaired sensation, Pain, Postural dysfunction  Visit Diagnosis: Unsteadiness on feet  Difficulty in walking, not elsewhere classified  Muscle weakness (generalized)  Other abnormalities of gait and mobility  Pain in left hip     Problem List Patient Active Problem List   Diagnosis Date Noted   Histoplasmosis 08/21/2019   Chronic left shoulder pain 08/03/2017   Aortic atherosclerosis ([place]) 07/18/2017   Cholelithiasis 03/20/2012   Osteoporosis 08/09/2006    Willow Ora, PTA, [facility] [address], [place], [place] [address] [phone] 12/19/20, 4:43 PM   Name: Brandi Villarreal MRN: [id number] Date of Birth: January 06, 1946

## 2020-12-22 ENCOUNTER — Encounter: Payer: Self-pay | Admitting: Physical Therapy

## 2020-12-22 ENCOUNTER — Ambulatory Visit: Payer: Medicare Other | Admitting: Physical Therapy

## 2020-12-22 ENCOUNTER — Other Ambulatory Visit: Payer: Self-pay

## 2020-12-22 DIAGNOSIS — M6281 Muscle weakness (generalized): Secondary | ICD-10-CM

## 2020-12-22 DIAGNOSIS — R2681 Unsteadiness on feet: Secondary | ICD-10-CM

## 2020-12-22 DIAGNOSIS — R262 Difficulty in walking, not elsewhere classified: Secondary | ICD-10-CM

## 2020-12-22 DIAGNOSIS — M25552 Pain in left hip: Secondary | ICD-10-CM | POA: Diagnosis not present

## 2020-12-22 DIAGNOSIS — R2689 Other abnormalities of gait and mobility: Secondary | ICD-10-CM | POA: Diagnosis not present

## 2020-12-22 NOTE — Therapy (Signed)
[facility] [address] [place], [place], [address] Phone: [phone]   Fax:  [phone]  Physical Therapy Treatment  Patient Details  Name: Brandi Villarreal MRN: [id number] Date of Birth: Oct 22, 1945 Referring Provider (PT): Dr. Leta Baptist   Encounter Date: 12/22/2020   PT End of Session - 12/22/20 1757     Visit Number 5    Number of Visits 17    Date for PT Re-Evaluation 03/03/21    Authorization Type [employer] Medicare - will need 10th visit PN    PT Start Time 1533    PT Stop Time 1615    PT Time Calculation (min) 42 min    Equipment Utilized During Treatment Gait belt    Activity Tolerance Patient tolerated treatment well;Patient limited by pain    Behavior During Therapy WFL for tasks assessed/performed             Past Medical History:  Diagnosis Date   Cholelithiasis    rare symptoms, declined surgery   Colitis ?1975   Hemorrhoids    Lichen sclerosus    sees gyn for management   Mitral valve prolapse    Osteoporosis    sees gyn   Right leg weakness    Varicose veins     Past Surgical History:  Procedure Laterality Date   jaw resection     RHINOPLASTY     TONSILLECTOMY     TUBAL LIGATION     bilteral    There were no vitals filed for this visit.   Subjective Assessment - 12/22/20 1536     Subjective Reports hip has been feeling better - has forgotten what to do with the tennis ball for her self massage. Wants to review. No falls. Has continued to walk short distances with a RW.    Patient is accompained by: Family member   daughter dropped her off   Pertinent History PMH: osteoporosis    Limitations Walking;Standing;House hold activities    How long can you stand comfortably? about 5 minutes    How long can you walk comfortably? approx. 64' with RW    Diagnostic tests MRI of the lumbar spine which showed mild to moderate spinal stenosis at L4-5 and some other degenerative changes. NCS on  11/26/20: Normal study.  No electrodiagnostic evidence of large fiber neuropathy at this time. MRI cervical spine showed multilevel degenerative changes.  MRI brain was unremarkable.    Patient Stated Goals wants to get back to normal    Currently in Pain? Yes    Pain Score 4     Pain Location Knee   posterior knee   Pain Orientation Right    Pain Descriptors / Indicators Sharp    Pain Type Acute pain    Aggravating Factors  nothing    Pain Relieving Factors massaging it                               [facility] Adult PT Treatment/Exercise - 12/22/20 1556       Transfers   Transfers Stand to Sit;Sit to Stand    Sit to Stand 5: Supervision;From bed;From chair/3-in-1;Without upper extremity assist    Comments x5 reps from slightly elevated mat table with no UE support      Ambulation/Gait   Ambulation/Gait Yes    Ambulation/Gait Assistance 4: Min guard    Ambulation Distance (Feet) 115 Feet   x2   Assistive  device Rolling walker    Gait Pattern Step-through pattern;Decreased stance time - right;Decreased step length - left;Decreased hip/knee flexion - right;Decreased weight shift to right;Antalgic    Ambulation Surface Level;Indoor    Gait Comments w/c follow from pt's husband (but not needed). cues for posture/looking ahead, pt able to maintain step through pattern. discussed gradually incr walking at home with RW (trying for 1-2 minute walks a couple times a day with husband)      Self-Care   Self-Care Other Self-Care Comments    Other Self-Care Comments  reviewed R self piriformis release with tennis ball in sitting and lying. ball given for use at home as pt had not performed since last visit. pt did better with performing in lying      Exercises   Exercises Other Exercises    Other Exercises  standing at RW: alternating toe taps to 4" step x8 reps bilat, incr difficulty with stance time on RLE. attempted calf stretch seated at edge of mat with gait belt but pt  reporting incr "electric feeling" going down RLE      Lumbar Exercises: Stretches   Passive Hamstring Stretch Right;3 reps;30 seconds    Passive Hamstring Stretch Limitations cues for technique - added to pt's HEP                       PT Short Term Goals - 12/11/20 1610       PT SHORT TERM GOAL #1   Title Pt and pt's spouse will be independent with initial HEP in order to build upon functional gains made in therapy. ALL STGS DUE 12/31/20    Baseline 12/11/20 [id number]    Time 4    Period Weeks    Status New    Target Date 12/31/20      PT SHORT TERM GOAL #2   Title Pt will undergo TUG with RW and LTG written in order to demo decr fall risk.    Time 4    Period Weeks    Status New      PT SHORT TERM GOAL #3   Title Pt will improve gait speed with RW to at least 1.0 ft/sec in order to demo improved gait efficiency/decr fall risk.    Baseline .58 ft/sec    Time 4    Period Weeks    Status New      PT SHORT TERM GOAL #4   Title Pt will ambulate at least 89' with RW and supervision over level indoor surfaces in order to demo improved household mobility.    Time 4    Period Weeks    Status New               PT Long Term Goals - 12/03/20 2037       PT LONG TERM GOAL #1   Title Pt and pt's spouse will be independent with final HEP in order to build upon functional gains made in therapy. ALL LTGS DUE 01/28/21    Time 8    Period Weeks    Status New    Target Date 01/28/21      PT LONG TERM GOAL #2   Title TUG goal to be written as appropriate in order to demo decr fall risk.    Time 8    Period Weeks    Status New      PT LONG TERM GOAL #3   Title Pt will perform 5x sit <> stand  in 15 seconds or less in order to demo improved functional BLE strength.    Baseline 18.12 seconds    Time 8    Period Weeks    Status New      PT LONG TERM GOAL #4   Title Pt will improve gait speed with RW to at least 2.0 ft/sec in order to demo improved gait  efficiency.    Baseline .58 ft/sec    Time 8    Period Weeks    Status New      PT LONG TERM GOAL #5   Title Pt will ambulate at least 39' with supervision and RW vs. LRAD in order to demo improved gait efficiency.    Baseline 50' with RW and min guard    Time 8    Period Weeks    Status New      Additional Long Term Goals   Additional Long Term Goals Yes      PT LONG TERM GOAL #6   Title Pt will go up and down 8 steps with bilat vs. single handrail with step to pattern and supervision in order to safely go up the stairs in her house.    Baseline not yet performed.    Time 8    Period Weeks    Status New                   Plan - 12/22/20 1758     Clinical Impression Statement Reviewed self mobilization technique for R piriformis at start of session as pt had not been performing at home. Remainder of session focused on gait training with RW, BLE strengthening/ROM, and standing tolerance. Pt able to ambulate 115' during one bout today with RW with consistent step through pattern. Will continue to progress towards LTGs.    Personal Factors and Comorbidities Comorbidity 1;Time since onset of injury/illness/exacerbation;Past/Current Experience    Comorbidities osteoporosis    Examination-Activity Limitations Locomotion Level;Lift;Dressing;Hygiene/Grooming;Bathing;Reach Overhead;Transfers;Stand;Stairs    Examination-Participation Restrictions Community Activity;Driving;Cleaning;Laundry    Stability/Clinical Decision Making Evolving/Moderate complexity    Rehab Potential Good    PT Frequency 2x / week    PT Duration 12 weeks    PT Treatment/Interventions ADLs/Self Care Home Management;Aquatic Therapy;Gait training;DME Instruction;Stair training;Functional mobility training;Therapeutic activities;Neuromuscular re-education;Balance training;Therapeutic exercise;Patient/family education;Passive range of motion;Manual techniques;Energy conservation;Vestibular    PT Next Visit Plan  standing balance. gait training with RW. Scifit/Nustep for strengthening. BLE strength    PT Home Exercise Plan [id number]    Consulted and Agree with Plan of Care Patient;Family member/caregiver    Family Member Consulted pt's husband             Patient will benefit from skilled therapeutic intervention in order to improve the following deficits and impairments:  Abnormal gait, Decreased balance, Decreased activity tolerance, Decreased mobility, Difficulty walking, Decreased strength, Impaired sensation, Pain, Postural dysfunction  Visit Diagnosis: Unsteadiness on feet  Difficulty in walking, not elsewhere classified  Muscle weakness (generalized)     Problem List Patient Active Problem List   Diagnosis Date Noted   Histoplasmosis 08/21/2019   Chronic left shoulder pain 08/03/2017   Aortic atherosclerosis ([place]) 07/18/2017   Cholelithiasis 03/20/2012   Osteoporosis 08/09/2006    Arliss Journey, PT, DPT  12/22/2020, 6:00 PM  [facility] [address] [place], [place], [address] Phone: [phone]   Fax:  [phone]  Name: Brandi Villarreal MRN: [id number] Date of Birth: Mar 22, 1946

## 2020-12-25 ENCOUNTER — Encounter: Payer: Self-pay | Admitting: Physical Therapy

## 2020-12-25 ENCOUNTER — Ambulatory Visit: Payer: Medicare Other | Admitting: Physical Therapy

## 2020-12-25 ENCOUNTER — Other Ambulatory Visit: Payer: Self-pay

## 2020-12-25 DIAGNOSIS — R2689 Other abnormalities of gait and mobility: Secondary | ICD-10-CM | POA: Diagnosis not present

## 2020-12-25 DIAGNOSIS — R262 Difficulty in walking, not elsewhere classified: Secondary | ICD-10-CM | POA: Diagnosis not present

## 2020-12-25 DIAGNOSIS — M25552 Pain in left hip: Secondary | ICD-10-CM | POA: Diagnosis not present

## 2020-12-25 DIAGNOSIS — R2681 Unsteadiness on feet: Secondary | ICD-10-CM

## 2020-12-25 DIAGNOSIS — M6281 Muscle weakness (generalized): Secondary | ICD-10-CM

## 2020-12-25 NOTE — Therapy (Signed)
[facility] [address] [place], [place], [address] Phone: [phone]   Fax:  [phone]  Physical Therapy Treatment  Patient Details  Name: Brandi Villarreal MRN: [id number] Date of Birth: [birth year] Referring Provider (PT): Dr. Leta Baptist   Encounter Date: 12/25/2020   PT End of Session - 12/25/20 1623     Visit Number 6    Number of Visits 17    Date for PT Re-Evaluation 03/03/21    Authorization Type UHC Medicare - will need 10th visit PN    PT Start Time 1448    PT Stop Time 1528    PT Time Calculation (min) 40 min    Equipment Utilized During Treatment Gait belt    Activity Tolerance Patient tolerated treatment well    Behavior During Therapy WFL for tasks assessed/performed             Past Medical History:  Diagnosis Date   Cholelithiasis    rare symptoms, declined surgery   Colitis ?1975   Hemorrhoids    Lichen sclerosus    sees gyn for management   Mitral valve prolapse    Osteoporosis    sees gyn   Right leg weakness    Varicose veins     Past Surgical History:  Procedure Laterality Date   jaw resection     RHINOPLASTY     TONSILLECTOMY     TUBAL LIGATION     bilteral    There were no vitals filed for this visit.   Subjective Assessment - 12/25/20 1449     Subjective Has been walking more at home. The twinge in her hip has been getting better. No falls.    Patient is accompained by: Family member   daughter dropped her off   Pertinent History PMH: osteoporosis    Limitations Walking;Standing;House hold activities    How long can you stand comfortably? about 5 minutes    How long can you walk comfortably? approx. 83' with RW    Diagnostic tests MRI of the lumbar spine which showed mild to moderate spinal stenosis at L4-5 and some other degenerative changes. NCS on 11/26/20: Normal study.  No electrodiagnostic evidence of large fiber neuropathy at this time. MRI cervical spine  showed multilevel degenerative changes.  MRI brain was unremarkable.    Patient Stated Goals wants to get back to normal    Currently in Pain? Yes    Pain Score 3     Pain Location Hip    Pain Orientation Right    Pain Descriptors / Indicators Sharp    Pain Type Acute pain    Aggravating Factors  sitting in her w/c    Pain Relieving Factors rubbing it                               [facility] Adult PT Treatment/Exercise - 12/25/20 1504       Ambulation/Gait   Ambulation/Gait Yes    Ambulation/Gait Assistance 5: Supervision;4: Min guard    Ambulation/Gait Assistance Details cues for posture, pt able to continue to perform with step through pattern    Ambulation Distance (Feet) 230 Feet   x1, 70' x 2   Assistive device Rolling walker    Gait Pattern Step-through pattern;Decreased stance time - right;Decreased step length - left;Decreased hip/knee flexion - right;Decreased weight shift to right;Antalgic    Ambulation Surface Level;Indoor      Exercises  Exercises Knee/Hip      Knee/Hip Exercises: Seated   Long Arc Quad Strengthening;AROM;Both;2 sets;10 reps    Long [employer] Limitations with red tband, cues for isometric hold    Hamstring Curl AROM;Strengthening;Right;2 sets;10 reps    Hamstring Limitations with therapist providing red tband resistance    Abduction/Adduction  Both;1 set;10 reps   seated ABD with red tband cues for 3 second isometric hold     Manual Therapy   Manual Therapy Soft tissue mobilization;Myofascial release    Soft tissue mobilization --    Myofascial Release use of myofascial release ball to L piriformis/glutes for decreased tightness and improved range of motion with pt reported improvement afterwards.                 Balance Exercises - 12/25/20 1516       Balance Exercises: Standing   SLS Eyes open;Upper extremity support 2;Limitations    SLS Limitations alternating foot taps to 4" step x10 reps bilat, cues for quad  activation on stance leg    Step Ups 4 inch;Forward;UE support 2;Limitations    Step Ups Limitations attempted leading with RLE, pt unable to perform and reporting incr R hip pain    Sidestepping Upper extremity support;2 reps;Limitations    Sidestepping Limitations down and back x2 reps with BUE support, cues to pick up L foot first sliding it    Heel Raises Both;10 reps    Toe Raise Both;10 reps                  PT Short Term Goals - 12/11/20 9476       PT SHORT TERM GOAL #1   Title Pt and pt's spouse will be independent with initial HEP in order to build upon functional gains made in therapy. ALL STGS DUE 12/31/20    Baseline 12/11/20 [id number]    Time 4    Period Weeks    Status New    Target Date 12/31/20      PT SHORT TERM GOAL #2   Title Pt will undergo TUG with RW and LTG written in order to demo decr fall risk.    Time 4    Period Weeks    Status New      PT SHORT TERM GOAL #3   Title Pt will improve gait speed with RW to at least 1.0 ft/sec in order to demo improved gait efficiency/decr fall risk.    Baseline .58 ft/sec    Time 4    Period Weeks    Status New      PT SHORT TERM GOAL #4   Title Pt will ambulate at least 74' with RW and supervision over level indoor surfaces in order to demo improved household mobility.    Time 4    Period Weeks    Status New               PT Long Term Goals - 12/03/20 2037       PT LONG TERM GOAL #1   Title Pt and pt's spouse will be independent with final HEP in order to build upon functional gains made in therapy. ALL LTGS DUE 01/28/21    Time 8    Period Weeks    Status New    Target Date 01/28/21      PT LONG TERM GOAL #2   Title TUG goal to be written as appropriate in order to demo decr fall risk.    Time  8    Period Weeks    Status New      PT LONG TERM GOAL #3   Title Pt will perform 5x sit <> stand in 15 seconds or less in order to demo improved functional BLE strength.    Baseline 18.12 seconds     Time 8    Period Weeks    Status New      PT LONG TERM GOAL #4   Title Pt will improve gait speed with RW to at least 2.0 ft/sec in order to demo improved gait efficiency.    Baseline .58 ft/sec    Time 8    Period Weeks    Status New      PT LONG TERM GOAL #5   Title Pt will ambulate at least 57' with supervision and RW vs. LRAD in order to demo improved gait efficiency.    Baseline 50' with RW and min guard    Time 8    Period Weeks    Status New      Additional Long Term Goals   Additional Long Term Goals Yes      PT LONG TERM GOAL #6   Title Pt will go up and down 8 steps with bilat vs. single handrail with step to pattern and supervision in order to safely go up the stairs in her house.    Baseline not yet performed.    Time 8    Period Weeks    Status New                   Plan - 12/25/20 1624     Clinical Impression Statement Pt able to significantly incr her walking distance with RW during today's session and improving from min guard > supervision. Worked on standing balance/weight shifting tasks in // bars for stability in RLE. Attempted RLE step up onto 4" step but pt unable to perform due to R hip pain. Will continue to progress towards LTGs.    Personal Factors and Comorbidities Comorbidity 1;Time since onset of injury/illness/exacerbation;Past/Current Experience    Comorbidities osteoporosis    Examination-Activity Limitations Locomotion Level;Lift;Dressing;Hygiene/Grooming;Bathing;Reach Overhead;Transfers;Stand;Stairs    Examination-Participation Restrictions Community Activity;Driving;Cleaning;Laundry    Stability/Clinical Decision Making Evolving/Moderate complexity    Rehab Potential Good    PT Frequency 2x / week    PT Duration 12 weeks    PT Treatment/Interventions ADLs/Self Care Home Management;Aquatic Therapy;Gait training;DME Instruction;Stair training;Functional mobility training;Therapeutic activities;Neuromuscular re-education;Balance  training;Therapeutic exercise;Patient/family education;Passive range of motion;Manual techniques;Energy conservation;Vestibular    PT Next Visit Plan standing balance. gait training with RW. Scifit/Nustep for strengthening. BLE strength. STM to L piriformis/glutes. STGs due next week.    PT Home Exercise Plan [id number]    Consulted and Agree with Plan of Care Patient;Family member/caregiver    Family Member Consulted pt's husband             Patient will benefit from skilled therapeutic intervention in order to improve the following deficits and impairments:  Abnormal gait, Decreased balance, Decreased activity tolerance, Decreased mobility, Difficulty walking, Decreased strength, Impaired sensation, Pain, Postural dysfunction  Visit Diagnosis: Unsteadiness on feet  Difficulty in walking, not elsewhere classified  Muscle weakness (generalized)     Problem List Patient Active Problem List   Diagnosis Date Noted   Histoplasmosis 08/21/2019   Chronic left shoulder pain 08/03/2017   Aortic atherosclerosis ([place]) 07/18/2017   Cholelithiasis 03/20/2012   Osteoporosis 08/09/2006    Arliss Journey, PT, DPT  12/25/2020, 4:25  PM  [facility] [address] [place], [place], [address] Phone: [phone]   Fax:  [phone]  Name: Brandi Villarreal MRN: [id number] Date of Birth: 03/22/1946

## 2020-12-29 ENCOUNTER — Ambulatory Visit: Payer: Medicare Other | Admitting: Physical Therapy

## 2020-12-29 ENCOUNTER — Other Ambulatory Visit: Payer: Self-pay

## 2020-12-29 ENCOUNTER — Encounter: Payer: Self-pay | Admitting: Physical Therapy

## 2020-12-29 DIAGNOSIS — R2681 Unsteadiness on feet: Secondary | ICD-10-CM

## 2020-12-29 DIAGNOSIS — M6281 Muscle weakness (generalized): Secondary | ICD-10-CM | POA: Diagnosis not present

## 2020-12-29 DIAGNOSIS — R262 Difficulty in walking, not elsewhere classified: Secondary | ICD-10-CM

## 2020-12-29 DIAGNOSIS — M25552 Pain in left hip: Secondary | ICD-10-CM

## 2020-12-29 DIAGNOSIS — M25561 Pain in right knee: Secondary | ICD-10-CM | POA: Diagnosis not present

## 2020-12-29 DIAGNOSIS — R2689 Other abnormalities of gait and mobility: Secondary | ICD-10-CM

## 2020-12-30 NOTE — Therapy (Signed)
[facility] [address] [place], [place], [address] Phone: [phone]   Fax:  [phone]  Physical Therapy Treatment  Patient Details  Name: Brandi Villarreal MRN: [id number] Date of Birth: 1945-08-16 Referring Provider (PT): Dr. Leta Baptist   Encounter Date: 12/29/2020   PT End of Session - 12/29/20 1239     Visit Number 7    Number of Visits 17    Date for PT Re-Evaluation 03/03/21    Authorization Type U[employer] Medicare - will need 10th visit PN    PT Start Time 1233    PT Stop Time 1315    PT Time Calculation (min) 42 min    Equipment Utilized During Treatment Gait belt    Activity Tolerance Patient tolerated treatment well    Behavior During Therapy WFL for tasks assessed/performed             Past Medical History:  Diagnosis Date   Cholelithiasis    rare symptoms, declined surgery   Colitis ?1975   Hemorrhoids    Lichen sclerosus    sees gyn for management   Mitral valve prolapse    Osteoporosis    sees gyn   Right leg weakness    Varicose veins     Past Surgical History:  Procedure Laterality Date   jaw resection     RHINOPLASTY     TONSILLECTOMY     TUBAL LIGATION     bilteral    There were no vitals filed for this visit.   Subjective Assessment - 12/29/20 1236     Subjective Saw Dr. Estrella Myrtle today for follow up (ortho MD) and had injection made into right knee as pt was having a lot of pain in the knee (woke at 5am with increased pain in knee and both legs). Not having in pain at this time in hip or knee. Does complain of a "knot" in her right quads.    Patient is accompained by: Family member   spouse   Pertinent History PMH: osteoporosis    Limitations Walking;Standing;House hold activities    How long can you stand comfortably? about 5 minutes    How long can you walk comfortably? approx. 29' with RW    Diagnostic tests MRI of the lumbar spine which showed mild to moderate spinal  stenosis at L4-5 and some other degenerative changes. NCS on 11/26/20: Normal study.  No electrodiagnostic evidence of large fiber neuropathy at this time. MRI cervical spine showed multilevel degenerative changes.  MRI brain was unremarkable.    Patient Stated Goals wants to get back to normal    Currently in Pain? No/denies    Pain Score 0-No pain                [facility] PT Assessment - 12/29/20 1241       Standardized Balance Assessment   Standardized Balance Assessment Timed Up and Go Test      Timed Up and Go Test   TUG Normal TUG    Normal TUG (seconds) 21.31   sec's with RW               [facility] Adult PT Treatment/Exercise - 12/29/20 1241       Transfers   Transfers Stand to Sit;Sit to Stand    Sit to Stand 5: Supervision;From bed;From chair/3-in-1;Without upper extremity assist    Stand to Sit 5: Supervision;With upper extremity assist      Ambulation/Gait   Ambulation/Gait Yes  Ambulation/Gait Assistance 5: Supervision;4: Min guard    Ambulation/Gait Assistance Details cues for posture and walker position with gait.    Ambulation Distance (Feet) 115 Feet   x1   Assistive device Rolling walker    Gait Pattern Step-through pattern;Decreased stance time - right;Decreased step length - left;Decreased hip/knee flexion - right;Decreased weight shift to right;Antalgic    Ambulation Surface Level;Indoor    Gait velocity 25.91 seconds= 1.27 ft/sec with RW      Self-Care   Self-Care Other Self-Care Comments    Other Self-Care Comments  reviewed use of tennis ball for piriformis release as pt's spouse reports sofa and bed too soft. Discussed use of kitchen chair or standing with walker at wall.      Exercises   Exercises Other Exercises    Other Exercises  holding chair for support: heel<>toe raises for 10 reps. Then alternating slow marching for 10 reps each side. Ending with alternating hip abduction for 10 reps each side. cues on posture and ex form with all ex's  performed.      Knee/Hip Exercises: Aerobic   Other Aerobic Scifit UE/LE's level 2.5 x 8 minutes with goal 30-40 steps per minute for strengthening and activity tolerance. towel placed under right thigh to pt with reports of it hurting when coming back down to edge of seat.                   PT Short Term Goals - 12/29/20 1630       PT SHORT TERM GOAL #1   Title Pt and pt's spouse will be independent with initial HEP in order to build upon functional gains made in therapy. ALL STGS DUE 12/31/20    Baseline 12/29/20: met with current program    Status Achieved      PT SHORT TERM GOAL #2   Title Pt will undergo TUG with RW and LTG written in order to demo decr fall risk.    Baseline 12/29/20: 21.31 sec's with RW.Primary PT to set goal.    Status Achieved      PT SHORT TERM GOAL #3   Title Pt will improve gait speed with RW to at least 1.0 ft/sec in order to demo improved gait efficiency/decr fall risk.    Baseline 12/29/20: 1.27 ft/sec with RW.    Status Achieved      PT SHORT TERM GOAL #4   Title Pt will ambulate at least 115' with RW and supervision over level indoor surfaces in order to demo improved household mobility.    Baseline 12/29/20: met in session today    Status Achieved               PT Long Term Goals - 12/03/20 2037       PT LONG TERM GOAL #1   Title Pt and pt's spouse will be independent with final HEP in order to build upon functional gains made in therapy. ALL LTGS DUE 01/28/21    Time 8    Period Weeks    Status New    Target Date 01/28/21      PT LONG TERM GOAL #2   Title TUG goal to be written as appropriate in order to demo decr fall risk.    Time 8    Period Weeks    Status New      PT LONG TERM GOAL #3   Title Pt will perform 5x sit <> stand in 15 seconds or less in order to  demo improved functional BLE strength.    Baseline 18.12 seconds    Time 8    Period Weeks    Status New      PT LONG TERM GOAL #4   Title Pt will improve  gait speed with RW to at least 2.0 ft/sec in order to demo improved gait efficiency.    Baseline .58 ft/sec    Time 8    Period Weeks    Status New      PT LONG TERM GOAL #5   Title Pt will ambulate at least 46' with supervision and RW vs. LRAD in order to demo improved gait efficiency.    Baseline 50' with RW and min guard    Time 8    Period Weeks    Status New      Additional Long Term Goals   Additional Long Term Goals Yes      PT LONG TERM GOAL #6   Title Pt will go up and down 8 steps with bilat vs. single handrail with step to pattern and supervision in order to safely go up the stairs in her house.    Baseline not yet performed.    Time 8    Period Weeks    Status New                 Plan - 12/29/20 1240     Clinical Impression Statement Today's skilled session continued to address strengthening with no issues noted or reported in session. Also addressed progress toward STGs with all goals met today. The pt is making steady progress toward goals and should benefit from continued PT to progress toward unmet goals.    Personal Factors and Comorbidities Comorbidity 1;Time since onset of injury/illness/exacerbation;Past/Current Experience    Comorbidities osteoporosis    Examination-Activity Limitations Locomotion Level;Lift;Dressing;Hygiene/Grooming;Bathing;Reach Overhead;Transfers;Stand;Stairs    Examination-Participation Restrictions Community Activity;Driving;Cleaning;Laundry    Stability/Clinical Decision Making Evolving/Moderate complexity    Rehab Potential Good    PT Frequency 2x / week    PT Duration 12 weeks    PT Treatment/Interventions ADLs/Self Care Home Management;Aquatic Therapy;Gait training;DME Instruction;Stair training;Functional mobility training;Therapeutic activities;Neuromuscular re-education;Balance training;Therapeutic exercise;Patient/family education;Passive range of motion;Manual techniques;Energy conservation;Vestibular    PT Next Visit  Plan standing balance. gait training with RW. Scifit/Nustep for strengthening. BLE strength. STM to L piriformis/glutes as needed.    PT Home Exercise Plan [id number]    Consulted and Agree with Plan of Care Patient;Family member/caregiver    Family Member Consulted pt's husband             Patient will benefit from skilled therapeutic intervention in order to improve the following deficits and impairments:  Abnormal gait, Decreased balance, Decreased activity tolerance, Decreased mobility, Difficulty walking, Decreased strength, Impaired sensation, Pain, Postural dysfunction  Visit Diagnosis: Unsteadiness on feet  Difficulty in walking, not elsewhere classified  Muscle weakness (generalized)  Other abnormalities of gait and mobility  Pain in left hip     Problem List Patient Active Problem List   Diagnosis Date Noted   Histoplasmosis 08/21/2019   Chronic left shoulder pain 08/03/2017   Aortic atherosclerosis ([place]) 07/18/2017   Cholelithiasis 03/20/2012   Osteoporosis 08/09/2006    Willow Ora, PTA 12/30/2020, 1:33 PM  [facility] [address] [place], [place], [address] Phone: [phone]   Fax:  [phone]  Name: Brandi Villarreal MRN: [id number] Date of Birth: 08-May-1945

## 2021-01-01 ENCOUNTER — Ambulatory Visit: Payer: Medicare Other | Admitting: Physical Therapy

## 2021-01-01 ENCOUNTER — Encounter: Payer: Self-pay | Admitting: Physical Therapy

## 2021-01-01 ENCOUNTER — Other Ambulatory Visit: Payer: Self-pay

## 2021-01-01 DIAGNOSIS — R2689 Other abnormalities of gait and mobility: Secondary | ICD-10-CM | POA: Diagnosis not present

## 2021-01-01 DIAGNOSIS — M6281 Muscle weakness (generalized): Secondary | ICD-10-CM

## 2021-01-01 DIAGNOSIS — M25552 Pain in left hip: Secondary | ICD-10-CM | POA: Diagnosis not present

## 2021-01-01 DIAGNOSIS — R2681 Unsteadiness on feet: Secondary | ICD-10-CM | POA: Diagnosis not present

## 2021-01-01 DIAGNOSIS — R262 Difficulty in walking, not elsewhere classified: Secondary | ICD-10-CM

## 2021-01-01 NOTE — Therapy (Signed)
[facility] [address] [place], [place], [address] Phone: [phone]   Fax:  [phone]  Physical Therapy Treatment  Patient Details  Name: Brandi Villarreal MRN: [id number] Date of Birth: 1945/09/28 Referring Provider (PT): Dr. Leta Baptist   Encounter Date: 01/01/2021   PT End of Session - 01/01/21 0947     Visit Number 8    Number of Visits 17    Date for PT Re-Evaluation 03/03/21    Authorization Type UHC Medicare - will need 10th visit PN    PT Start Time 0847    PT Stop Time 0932    PT Time Calculation (min) 45 min    Equipment Utilized During Treatment Gait belt    Activity Tolerance Patient tolerated treatment well   Pt reports decreased pain after STM and ice massage   Behavior During Therapy [facility] for tasks assessed/performed             Past Medical History:  Diagnosis Date   Cholelithiasis    rare symptoms, declined surgery   Colitis ?1975   Hemorrhoids    Lichen sclerosus    sees gyn for management   Mitral valve prolapse    Osteoporosis    sees gyn   Right leg weakness    Varicose veins     Past Surgical History:  Procedure Laterality Date   jaw resection     RHINOPLASTY     TONSILLECTOMY     TUBAL LIGATION     bilteral    There were no vitals filed for this visit.   Subjective Assessment - 01/01/21 0848     Subjective Per husband, had more pain in hip than she had been having.    Patient is accompained by: Family member   spouse   Pertinent History PMH: osteoporosis    Limitations Walking;Standing;House hold activities    How long can you stand comfortably? about 5 minutes    How long can you walk comfortably? approx. 18' with RW    Diagnostic tests MRI of the lumbar spine which showed mild to moderate spinal stenosis at L4-5 and some other degenerative changes. NCS on 11/26/20: Normal study.  No electrodiagnostic evidence of large fiber neuropathy at this time. MRI cervical  spine showed multilevel degenerative changes.  MRI brain was unremarkable.    Patient Stated Goals wants to get back to normal    Currently in Pain? No/denies    Pain Score 2     Pain Location Hip    Pain Orientation Right    Pain Descriptors / Indicators Sharp    Pain Type Acute pain    Aggravating Factors  worse last night    Pain Relieving Factors rubbing it    Multiple Pain Sites Yes    Pain Score 4    Pain Location Knee    Pain Orientation Right    Pain Descriptors / Indicators Aching    Pain Type Acute pain    Pain Onset In the past 7 days    Pain Frequency Intermittent    Aggravating Factors  worse earlier this morning    Pain Relieving Factors rubbing it                               [facility] Adult PT Treatment/Exercise - 01/01/21 0001       Transfers   Transfers Stand to Sit;Sit to Stand    Sit to  Stand 5: Supervision;From bed;From chair/3-in-1;Without upper extremity assist    Stand to Sit 5: Supervision;With upper extremity assist      Ambulation/Gait   Ambulation/Gait Yes    Ambulation/Gait Assistance 4: Min guard    Ambulation/Gait Assistance Details Cues for posture and for step length    Ambulation Distance (Feet) 25 Feet   100   Assistive device Rolling walker    Gait Pattern Step-through pattern;Decreased stance time - right;Decreased step length - left;Decreased hip/knee flexion - right;Decreased weight shift to right;Antalgic    Ambulation Surface Level;Indoor    Gait Comments After manual therapy and ice massage, pt demo decreased antalgic gait pattern, improved equal, even step length.      Self-Care   Self-Care Other Self-Care Comments    Other Self-Care Comments  Discussed seated positioning, as pt tends to stay in reclined position most of the day in a sofa recliner.  Discussed seated upright posture/positioning several times through the day to lessen pressure at piriformis in reclined position.  Discussed use of ice massage at  piriformis and IT band, with demo/instruction given to R piriformis (due to clothing management issues) x 4 minutes and pt's husband return demo understanding.  Pt reports pain improvement after.      Exercises   Exercises Other Exercises    Other Exercises  STanding with wide BOS, supported at chair, anterior/posterior rocking through hips for hamstring stretch, then gastroc/hip flexor stretch, x 5 reps 10 sec each.      Manual Therapy   Manual Therapy Soft tissue mobilization    Manual therapy comments point tender to trochanteric bursitis and R piriformis fibers as well as R iliotibial band    Soft tissue mobilization Piriformis releast to R for 1 minute, 5 reps, then Manual therapy/massage to R piriformis using heel of hand and foam roller for muscle relaxation, decreased pain.  Soft tissue massage along R IT band, with pt noting increased pain along middle of IT band); cross friction massage provided middle of IT band at most tight/painful areas.                     PT Education - 01/01/21 0947     Education Details Use of ice massage at R piriformis and IT band (approx 5 minutes, 1-2x/day)    Person(s) Educated Patient;Spouse    Methods Explanation;Demonstration;Verbal cues    Comprehension Verbalized understanding;Returned demonstration;Verbal cues required              PT Short Term Goals - 12/29/20 1630       PT SHORT TERM GOAL #1   Title Pt and pt's spouse will be independent with initial HEP in order to build upon functional gains made in therapy. ALL STGS DUE 12/31/20    Baseline 12/29/20: met with current program    Status Achieved      PT SHORT TERM GOAL #2   Title Pt will undergo TUG with RW and LTG written in order to demo decr fall risk.    Baseline 12/29/20: 21.31 sec's with RW.Primary PT to set goal.    Status Achieved      PT SHORT TERM GOAL #3   Title Pt will improve gait speed with RW to at least 1.0 ft/sec in order to demo improved gait  efficiency/decr fall risk.    Baseline 12/29/20: 1.27 ft/sec with RW.    Status Achieved      PT SHORT TERM GOAL #4   Title Pt will  ambulate at least 6' with RW and supervision over level indoor surfaces in order to demo improved household mobility.    Baseline 12/29/20: met in session today    Status Achieved               PT Long Term Goals - 12/03/20 2037       PT LONG TERM GOAL #1   Title Pt and pt's spouse will be independent with final HEP in order to build upon functional gains made in therapy. ALL LTGS DUE 01/28/21    Time 8    Period Weeks    Status New    Target Date 01/28/21      PT LONG TERM GOAL #2   Title TUG goal to be written as appropriate in order to demo decr fall risk.    Time 8    Period Weeks    Status New      PT LONG TERM GOAL #3   Title Pt will perform 5x sit <> stand in 15 seconds or less in order to demo improved functional BLE strength.    Baseline 18.12 seconds    Time 8    Period Weeks    Status New      PT LONG TERM GOAL #4   Title Pt will improve gait speed with RW to at least 2.0 ft/sec in order to demo improved gait efficiency.    Baseline .58 ft/sec    Time 8    Period Weeks    Status New      PT LONG TERM GOAL #5   Title Pt will ambulate at least 55' with supervision and RW vs. LRAD in order to demo improved gait efficiency.    Baseline 50' with RW and min guard    Time 8    Period Weeks    Status New      Additional Long Term Goals   Additional Long Term Goals Yes      PT LONG TERM GOAL #6   Title Pt will go up and down 8 steps with bilat vs. single handrail with step to pattern and supervision in order to safely go up the stairs in her house.    Baseline not yet performed.    Time 8    Period Weeks    Status New                   Plan - 01/01/21 0949     Clinical Impression Statement Pt with increased reports of hip and quad pain today; husband reports she was crying in pain earlier this morning.  Pt  c/o pain with general movements with transfers and bed mobility to get set up for soft tissue mob/manual therapy/massage.  Focused on R piriformis and also R iliotibial band, as pt reports increased pain and noted tightness/tenderness to palpation.  Demo use of ice massage and instructed husband in use, as pt notes improvement in pain after STM and ice massage.  Gait pattern is not as antalgic.  She will conitnue to benefit from skilled PT to address weakness, pain for improved funcitonal mobility towards goals.    Personal Factors and Comorbidities Comorbidity 1;Time since onset of injury/illness/exacerbation;Past/Current Experience    Comorbidities osteoporosis    Examination-Activity Limitations Locomotion Level;Lift;Dressing;Hygiene/Grooming;Bathing;Reach Overhead;Transfers;Stand;Stairs    Examination-Participation Restrictions Community Activity;Driving;Cleaning;Laundry    Stability/Clinical Decision Making Evolving/Moderate complexity    Rehab Potential Good    PT Frequency 2x / week    PT Duration  12 weeks    PT Treatment/Interventions ADLs/Self Care Home Management;Aquatic Therapy;Gait training;DME Instruction;Stair training;Functional mobility training;Therapeutic activities;Neuromuscular re-education;Balance training;Therapeutic exercise;Patient/family education;Passive range of motion;Manual techniques;Energy conservation;Vestibular    PT Next Visit Plan standing balance. gait training with RW. Scifit/Nustep for strengthening. BLE strength. STM to L piriformis/glutes as needed.  Ask how use of ice massage went at home; also did she try to sit more and not stay in reclined position most of the day?    PT Home Exercise Plan [id number]    Consulted and Agree with Plan of Care Patient;Family member/caregiver    Family Member Consulted pt's husband             Patient will benefit from skilled therapeutic intervention in order to improve the following deficits and impairments:  Abnormal  gait, Decreased balance, Decreased activity tolerance, Decreased mobility, Difficulty walking, Decreased strength, Impaired sensation, Pain, Postural dysfunction  Visit Diagnosis: Muscle weakness (generalized)  Difficulty in walking, not elsewhere classified  Other abnormalities of gait and mobility     Problem List Patient Active Problem List   Diagnosis Date Noted   Histoplasmosis 08/21/2019   Chronic left shoulder pain 08/03/2017   Aortic atherosclerosis ([place]) 07/18/2017   Cholelithiasis 03/20/2012   Osteoporosis 08/09/2006    Alania Overholt W., PT 01/01/2021, 9:53 AM  [facility] [address] [place], [place], [address] Phone: [phone]   Fax:  [phone]  Name: Brandi Villarreal MRN: [id number] Date of Birth: 01-01-1946

## 2021-01-05 ENCOUNTER — Encounter: Payer: Self-pay | Admitting: Physical Therapy

## 2021-01-05 ENCOUNTER — Ambulatory Visit: Payer: Medicare Other | Attending: Diagnostic Neuroimaging | Admitting: Physical Therapy

## 2021-01-05 ENCOUNTER — Other Ambulatory Visit: Payer: Self-pay

## 2021-01-05 DIAGNOSIS — R2689 Other abnormalities of gait and mobility: Secondary | ICD-10-CM | POA: Insufficient documentation

## 2021-01-05 DIAGNOSIS — R262 Difficulty in walking, not elsewhere classified: Secondary | ICD-10-CM | POA: Insufficient documentation

## 2021-01-05 DIAGNOSIS — M25551 Pain in right hip: Secondary | ICD-10-CM | POA: Insufficient documentation

## 2021-01-05 DIAGNOSIS — M25552 Pain in left hip: Secondary | ICD-10-CM | POA: Insufficient documentation

## 2021-01-05 DIAGNOSIS — M6281 Muscle weakness (generalized): Secondary | ICD-10-CM | POA: Insufficient documentation

## 2021-01-05 DIAGNOSIS — R2681 Unsteadiness on feet: Secondary | ICD-10-CM | POA: Insufficient documentation

## 2021-01-05 NOTE — Therapy (Signed)
[facility] [address] [place], [place], [address] Phone: [phone]   Fax:  [phone]  Physical Therapy Treatment  Patient Details  Name: Brandi Villarreal MRN: [id number] Date of Birth: October 04, 1945 Referring Provider (PT): Dr. Leta Baptist   Encounter Date: 01/05/2021   PT End of Session - 01/05/21 1728     Visit Number 9    Number of Visits 17    Date for PT Re-Evaluation 03/03/21    Authorization Type [employer] Medicare - will need 10th visit PN    PT Start Time 1533    PT Stop Time 1616    PT Time Calculation (min) 43 min    Equipment Utilized During Treatment Gait belt    Activity Tolerance Patient tolerated treatment well    Behavior During Therapy WFL for tasks assessed/performed             Past Medical History:  Diagnosis Date   Cholelithiasis    rare symptoms, declined surgery   Colitis ?1975   Hemorrhoids    Lichen sclerosus    sees gyn for management   Mitral valve prolapse    Osteoporosis    sees gyn   Right leg weakness    Varicose veins     Past Surgical History:  Procedure Laterality Date   jaw resection     RHINOPLASTY     TONSILLECTOMY     TUBAL LIGATION     bilteral    There were no vitals filed for this visit.   Subjective Assessment - 01/05/21 1535     Subjective Went to the beach over the weekend. Went well. Reports R hip pain is doing much better today. Having more discomfort on the insides of her legs. Has been doing her ice massage and tennis ball massage some at home. Getting up and going to the bathroom on her own.    Patient is accompained by: Family member   spouse   Pertinent History PMH: osteoporosis    Limitations Walking;Standing;House hold activities    How long can you stand comfortably? about 5 minutes    How long can you walk comfortably? approx. 38' with RW    Diagnostic tests MRI of the lumbar spine which showed mild to moderate spinal stenosis at L4-5 and  some other degenerative changes. NCS on 11/26/20: Normal study.  No electrodiagnostic evidence of large fiber neuropathy at this time. MRI cervical spine showed multilevel degenerative changes.  MRI brain was unremarkable.    Patient Stated Goals wants to get back to normal    Pain Onset In the past 7 days                               [facility] Adult PT Treatment/Exercise - 01/05/21 1607       Self-Care   Self-Care Other Self-Care Comments    Other Self-Care Comments  Reviwed seated positioning education given at last session - with spending less time in sofa recliner and more time in more sturdy chair. Reviewed pt's husband performing ice massage to piriformis/IT band or use or gentle tennis ball massage to decr pain      Exercises   Exercises Other Exercises    Other Exercises  Pt with incr TTP to R hip ADDuctors. Attempted supine butterfly stretch, but pt did not report feeling any stretch. Trialed seated long sitting with legs in a V and gently reaching forwards with pt  reporting feeling a good stretch, discussed importance of holding for 30 seconds at a time and not forcing a stretch. Performed 3 x 30 seconds, added to pt's HEP. Standing at chair with BUE support: 2 x 5 reps hip extension bilat, 2 x 5 reps bilat hip ABD bilat, cues for technique and glute activation. Wide BOS lateral weight shifting x10 reps with feet staying on floor vs. lifting up heel      Manual Therapy   Manual Therapy Soft tissue mobilization    Manual therapy comments Tender to R piriformis, R IT band (more medially and distally)    Soft tissue mobilization Piriformis releast to R for 1 minute x3 reps then Manual therapy/massage to R piriformis using heel of hand and myofasical release pall for decr pain decreased pain.  Soft tissue massage along R IT band with therapist using heel of hand/use of small foam roller, incr pain in middle of IT band. Also performed using tennis ball for pt's husband to  perform at home (either with tennis ball or ice massage)                     PT Education - 01/05/21 1728     Education Details Long V sitting hip ADD stretch for pt to perofrm on her bed at home.    Person(s) Educated Patient;Spouse    Methods Explanation;Handout;Demonstration    Comprehension Verbalized understanding;Returned demonstration              PT Short Term Goals - 12/29/20 1630       PT SHORT TERM GOAL #1   Title Pt and pt's spouse will be independent with initial HEP in order to build upon functional gains made in therapy. ALL STGS DUE 12/31/20    Baseline 12/29/20: met with current program    Status Achieved      PT SHORT TERM GOAL #2   Title Pt will undergo TUG with RW and LTG written in order to demo decr fall risk.    Baseline 12/29/20: 21.31 sec's with RW.Primary PT to set goal.    Status Achieved      PT SHORT TERM GOAL #3   Title Pt will improve gait speed with RW to at least 1.0 ft/sec in order to demo improved gait efficiency/decr fall risk.    Baseline 12/29/20: 1.27 ft/sec with RW.    Status Achieved      PT SHORT TERM GOAL #4   Title Pt will ambulate at least 115' with RW and supervision over level indoor surfaces in order to demo improved household mobility.    Baseline 12/29/20: met in session today    Status Achieved               PT Long Term Goals - 12/03/20 2037       PT LONG TERM GOAL #1   Title Pt and pt's spouse will be independent with final HEP in order to build upon functional gains made in therapy. ALL LTGS DUE 01/28/21    Time 8    Period Weeks    Status New    Target Date 01/28/21      PT LONG TERM GOAL #2   Title TUG goal to be written as appropriate in order to demo decr fall risk.    Time 8    Period Weeks    Status New      PT LONG TERM GOAL #3   Title Pt will perform 5x  sit <> stand in 15 seconds or less in order to demo improved functional BLE strength.    Baseline 18.12 seconds    Time 8    Period  Weeks    Status New      PT LONG TERM GOAL #4   Title Pt will improve gait speed with RW to at least 2.0 ft/sec in order to demo improved gait efficiency.    Baseline .58 ft/sec    Time 8    Period Weeks    Status New      PT LONG TERM GOAL #5   Title Pt will ambulate at least 41' with supervision and RW vs. LRAD in order to demo improved gait efficiency.    Baseline 50' with RW and min guard    Time 8    Period Weeks    Status New      Additional Long Term Goals   Additional Long Term Goals Yes      PT LONG TERM GOAL #6   Title Pt will go up and down 8 steps with bilat vs. single handrail with step to pattern and supervision in order to safely go up the stairs in her house.    Baseline not yet performed.    Time 8    Period Weeks    Status New                   Plan - 01/05/21 1731     Clinical Impression Statement Pt reporting improvements in her R hip today. Started session with soft tissue mobilization with use of foam roller/myofasical release ball to pt's R piriformis and R IT band. Reviewed with pt's spouse performing ice massage daily or could also use tennis ball for soft tissue mobilization. Pt reporting incr tenderness to R hip ADDuctors, performed long sitting hip ADD with pt reporting feeling relief from stretch, added to pt's HEP. Will continue to progress towards LTGs.    Personal Factors and Comorbidities Comorbidity 1;Time since onset of injury/illness/exacerbation;Past/Current Experience    Comorbidities osteoporosis    Examination-Activity Limitations Locomotion Level;Lift;Dressing;Hygiene/Grooming;Bathing;Reach Overhead;Transfers;Stand;Stairs    Examination-Participation Restrictions Community Activity;Driving;Cleaning;Laundry    Stability/Clinical Decision Making Evolving/Moderate complexity    Rehab Potential Good    PT Frequency 2x / week    PT Duration 12 weeks    PT Treatment/Interventions ADLs/Self Care Home Management;Aquatic Therapy;Gait  training;DME Instruction;Stair training;Functional mobility training;Therapeutic activities;Neuromuscular re-education;Balance training;Therapeutic exercise;Patient/family education;Passive range of motion;Manual techniques;Energy conservation;Vestibular    PT Next Visit Plan standing balance. gait training with RW. Scifit/Nustep for strengthening. BLE strength. STM to L piriformis/glutes as needed.  How is ice massage going at home and new stretch?    PT Home Exercise Plan [id number]    Consulted and Agree with Plan of Care Patient;Family member/caregiver    Family Member Consulted pt's husband             Patient will benefit from skilled therapeutic intervention in order to improve the following deficits and impairments:  Abnormal gait, Decreased balance, Decreased activity tolerance, Decreased mobility, Difficulty walking, Decreased strength, Impaired sensation, Pain, Postural dysfunction  Visit Diagnosis: Muscle weakness (generalized)  Difficulty in walking, not elsewhere classified  Other abnormalities of gait and mobility  Unsteadiness on feet     Problem List Patient Active Problem List   Diagnosis Date Noted   Histoplasmosis 08/21/2019   Chronic left shoulder pain 08/03/2017   Aortic atherosclerosis ([place]) 07/18/2017   Cholelithiasis 03/20/2012   Osteoporosis 08/09/2006  Arliss Journey, PT, DPT  01/05/2021, 5:34 PM  [facility] [address] [place], [place], [address] Phone: [phone]   Fax:  [phone]  Name: Brandi Villarreal MRN: [id number] Date of Birth: 04/06/1945

## 2021-01-08 ENCOUNTER — Other Ambulatory Visit: Payer: Self-pay

## 2021-01-08 ENCOUNTER — Ambulatory Visit: Payer: Medicare Other | Admitting: Physical Therapy

## 2021-01-08 ENCOUNTER — Encounter: Payer: Self-pay | Admitting: Physical Therapy

## 2021-01-08 DIAGNOSIS — M6281 Muscle weakness (generalized): Secondary | ICD-10-CM | POA: Diagnosis not present

## 2021-01-08 DIAGNOSIS — R2681 Unsteadiness on feet: Secondary | ICD-10-CM

## 2021-01-08 DIAGNOSIS — R262 Difficulty in walking, not elsewhere classified: Secondary | ICD-10-CM

## 2021-01-08 DIAGNOSIS — R2689 Other abnormalities of gait and mobility: Secondary | ICD-10-CM | POA: Diagnosis not present

## 2021-01-08 DIAGNOSIS — M25552 Pain in left hip: Secondary | ICD-10-CM | POA: Diagnosis not present

## 2021-01-08 DIAGNOSIS — M25551 Pain in right hip: Secondary | ICD-10-CM | POA: Diagnosis not present

## 2021-01-08 NOTE — Therapy (Signed)
[facility] [address] [place], [place], [address] Phone: [phone]   Fax:  [phone]  Physical Therapy Treatment/10th Visit Progress Note  Patient Details  Name: Brandi Villarreal MRN: [id number] Date of Birth: 09-Jan-1946 Referring Provider (PT): Dr. Leta Baptist   10th Visit Physical Therapy Progress Note  Dates of Reporting Period: 12/03/20 to 01/08/21    Encounter Date: 01/08/2021   PT End of Session - 01/08/21 1147     Visit Number 10    Number of Visits 17    Date for PT Re-Evaluation 03/03/21    Authorization Type [employer] Medicare - will need 10th visit PN    PT Start Time 0848    PT Stop Time 0928    PT Time Calculation (min) 40 min    Equipment Utilized During Treatment Gait belt    Activity Tolerance Patient tolerated treatment well    Behavior During Therapy WFL for tasks assessed/performed             Past Medical History:  Diagnosis Date   Cholelithiasis    rare symptoms, declined surgery   Colitis ?1975   Hemorrhoids    Lichen sclerosus    sees gyn for management   Mitral valve prolapse    Osteoporosis    sees gyn   Right leg weakness    Varicose veins     Past Surgical History:  Procedure Laterality Date   jaw resection     RHINOPLASTY     TONSILLECTOMY     TUBAL LIGATION     bilteral    There were no vitals filed for this visit.   Subjective Assessment - 01/08/21 0851     Subjective Reports R hip is feeling a lot better today. Not really pain, but still twinges and tightens up. Has done the ice massage once    Patient is accompained by: Family member   spouse   Pertinent History PMH: osteoporosis    Limitations Walking;Standing;House hold activities    How long can you stand comfortably? about 5 minutes    How long can you walk comfortably? approx. 28' with RW    Diagnostic tests MRI of the lumbar spine which showed mild to moderate spinal stenosis at L4-5 and some other  degenerative changes. NCS on 11/26/20: Normal study.  No electrodiagnostic evidence of large fiber neuropathy at this time. MRI cervical spine showed multilevel degenerative changes.  MRI brain was unremarkable.    Patient Stated Goals wants to get back to normal    Currently in Pain? No/denies    Pain Onset In the past 7 days                               [facility] Adult PT Treatment/Exercise - 01/08/21 7741       Ambulation/Gait   Ambulation/Gait Yes    Ambulation/Gait Assistance 5: Supervision    Ambulation Distance (Feet) 100 Feet   x2   Assistive device Rolling walker    Gait Pattern Step-through pattern;Decreased stance time - right;Decreased step length - left;Decreased hip/knee flexion - right;Decreased weight shift to right;Antalgic    Ambulation Surface Level;Indoor      Exercises   Exercises Other Exercises    Other Exercises  --      Lumbar Exercises: Stretches   Passive Hamstring Stretch Right;2 reps;30 seconds    Passive Hamstring Stretch Limitations With pt supine on mat table, therapist then  bringing pt's RLE across her body for IT band stretch, 2 x 30 seconds    Piriformis Stretch 3 reps;30 seconds;Right    Piriformis Stretch Limitations with pt reaching and grabbing L thigh and bringing towards chest for additional stretch      Knee/Hip Exercises: Aerobic   Other Aerobic Scifit BLE only level 2.5 x 6 minutes for strengthening and activity tolerance. Reclined seat back with pt reporting decr pain under right thigh.      Knee/Hip Exercises: Supine   Quad Sets Strengthening;AROM;Right;1 set;10 reps    Quad Sets Limitations Cues for isometric hold and full knee extension    Engineer, agricultural Limitations x5 reps, then an additional x10 reps with yellow tband around thighs for hip ABD isometric activation with cues to press into band, cues for technique    Straight Leg Raises Strengthening;AROM;Right;1 set;10 reps    Straight Leg Raises  Limitations With cues for quad set first    Other Supine Knee/Hip Exercises Hooklying bent knee fall outs with yellow tband with RLE      Manual Therapy   Manual Therapy Soft tissue mobilization    Manual therapy comments Tender to R piriformis, R IT band (more medially and distally)    Soft tissue mobilization Piriformis releast to R for 1 minute x3 reps then Manual therapy/massage to R piriformis using myofasical release ball for decr pain decreased pain.  Soft tissue massage along R IT band with therapist using heel of hand/use of small foam roller, incr pain in middle of IT band. Cross friction massage performed more medially on IT band on more painful areas.                     PT Education - 01/08/21 1146     Education Details Verbally reviewed stretches and importance of performing at home consistently as well as ice massage/tennis ball self mobilization instead of just performing when pt is in increased pain.    Person(s) Educated Patient    Methods Explanation    Comprehension Verbalized understanding              PT Short Term Goals - 12/29/20 1630       PT SHORT TERM GOAL #1   Title Pt and pt's spouse will be independent with initial HEP in order to build upon functional gains made in therapy. ALL STGS DUE 12/31/20    Baseline 12/29/20: met with current program    Status Achieved      PT SHORT TERM GOAL #2   Title Pt will undergo TUG with RW and LTG written in order to demo decr fall risk.    Baseline 12/29/20: 21.31 sec's with RW.Primary PT to set goal.    Status Achieved      PT SHORT TERM GOAL #3   Title Pt will improve gait speed with RW to at least 1.0 ft/sec in order to demo improved gait efficiency/decr fall risk.    Baseline 12/29/20: 1.27 ft/sec with RW.    Status Achieved      PT SHORT TERM GOAL #4   Title Pt will ambulate at least 115' with RW and supervision over level indoor surfaces in order to demo improved household mobility.    Baseline  12/29/20: met in session today    Status Achieved               PT Long Term Goals - 01/08/21 1149  PT LONG TERM GOAL #1   Title Pt and pt's spouse will be independent with final HEP in order to build upon functional gains made in therapy. ALL LTGS DUE 01/28/21    Time 8    Period Weeks    Status New      PT LONG TERM GOAL #2   Title Pt will decr TUG with RW.vs. no AD in 18 seconds or less in order to demo decr fall risk.    Baseline 21.31    Time 8    Period Weeks    Status Revised      PT LONG TERM GOAL #3   Title Pt will perform 5x sit <> stand in 15 seconds or less in order to demo improved functional BLE strength.    Baseline 18.12 seconds    Time 8    Period Weeks    Status New      PT LONG TERM GOAL #4   Title Pt will improve gait speed with RW to at least 2.0 ft/sec in order to demo improved gait efficiency.    Baseline .58 ft/sec    Time 8    Period Weeks    Status New      PT LONG TERM GOAL #5   Title Pt will ambulate at least 17' with supervision and RW vs. LRAD in order to demo improved gait efficiency.    Baseline 50' with RW and min guard    Time 8    Period Weeks    Status New      PT LONG TERM GOAL #6   Title Pt will go up and down 8 steps with bilat vs. single handrail with step to pattern and supervision in order to safely go up the stairs in her house.    Baseline not yet performed.    Time 8    Period Weeks    Status New                   Plan - 01/08/21 1150     Clinical Impression Statement 10th visit progress note: Pt reporting decr pain in R hip, but does still report twinges in hip with activity/some exercises. Continued to educate importance on consistent performance of exercises/stretches and self mobilizations at home for pain relief. With goals last assessed on 12/29/20, pt with improvement of gait speed to 1.27 ft /sec with RW (at eval was .3 ft/sec), indicating pt is at an incr fall risk and a household ambulator.  TUG was also performed with pt performing in 21.31 seconds, indicating that pt is at an incr fall risk with RW. Have continued to reinforce incr walking distance at home with RW and improved positioning throughout the day to decr stress on piriformis instead of spending majority of time in the recliner. Will continue to progress towards LTGs.    Personal Factors and Comorbidities Comorbidity 1;Time since onset of injury/illness/exacerbation;Past/Current Experience    Comorbidities osteoporosis    Examination-Activity Limitations Locomotion Level;Lift;Dressing;Hygiene/Grooming;Bathing;Reach Overhead;Transfers;Stand;Stairs    Examination-Participation Restrictions Community Activity;Driving;Cleaning;Laundry    Stability/Clinical Decision Making Evolving/Moderate complexity    Rehab Potential Good    PT Frequency 2x / week    PT Duration 12 weeks    PT Treatment/Interventions ADLs/Self Care Home Management;Aquatic Therapy;Gait training;DME Instruction;Stair training;Functional mobility training;Therapeutic activities;Neuromuscular re-education;Balance training;Therapeutic exercise;Patient/family education;Passive range of motion;Manual techniques;Energy conservation;Vestibular    PT Next Visit Plan standing balance. gait training with RW. Scifit/Nustep for strengthening. BLE strength. stair training. STM to  L piriformis/glutes as needed - but making sure pt is doing her exercises and self mobilizations at home.    PT Home Exercise Plan [id number]    Consulted and Agree with Plan of Care Patient;Family member/caregiver    Family Member Consulted pt's husband             Patient will benefit from skilled therapeutic intervention in order to improve the following deficits and impairments:  Abnormal gait, Decreased balance, Decreased activity tolerance, Decreased mobility, Difficulty walking, Decreased strength, Impaired sensation, Pain, Postural dysfunction  Visit Diagnosis: Muscle weakness  (generalized)  Difficulty in walking, not elsewhere classified  Other abnormalities of gait and mobility  Unsteadiness on feet     Problem List Patient Active Problem List   Diagnosis Date Noted   Histoplasmosis 08/21/2019   Chronic left shoulder pain 08/03/2017   Aortic atherosclerosis ([place]) 07/18/2017   Cholelithiasis 03/20/2012   Osteoporosis 08/09/2006    Arliss Journey, PT, DPT  01/08/2021, 11:54 AM  [facility] [address] [place], [place], [address] Phone: [phone]   Fax:  [phone]  Name: Brandi Villarreal MRN: [id number] Date of Birth: November 02, 1945

## 2021-01-12 ENCOUNTER — Ambulatory Visit: Payer: Medicare Other | Admitting: Physical Therapy

## 2021-01-12 ENCOUNTER — Other Ambulatory Visit: Payer: Self-pay

## 2021-01-12 DIAGNOSIS — R2681 Unsteadiness on feet: Secondary | ICD-10-CM

## 2021-01-12 DIAGNOSIS — R2689 Other abnormalities of gait and mobility: Secondary | ICD-10-CM

## 2021-01-12 DIAGNOSIS — M25551 Pain in right hip: Secondary | ICD-10-CM | POA: Diagnosis not present

## 2021-01-12 DIAGNOSIS — M6281 Muscle weakness (generalized): Secondary | ICD-10-CM | POA: Diagnosis not present

## 2021-01-12 DIAGNOSIS — R262 Difficulty in walking, not elsewhere classified: Secondary | ICD-10-CM | POA: Diagnosis not present

## 2021-01-12 DIAGNOSIS — M25552 Pain in left hip: Secondary | ICD-10-CM | POA: Diagnosis not present

## 2021-01-13 NOTE — Therapy (Signed)
[facility] [address] [place], [place], [address] Phone: [phone]   Fax:  [phone]  Physical Therapy Treatment  Patient Details  Name: Brandi Villarreal MRN: [id number] Date of Birth: 10/25/1945 Referring Provider (PT): Dr. Leta Baptist   Encounter Date: 01/12/2021   PT End of Session - 01/12/21 1538     Visit Number 11    Number of Visits 17    Date for PT Re-Evaluation 03/03/21    Authorization Type [employer] Medicare - will need 10th visit PN    PT Start Time 1533    PT Stop Time 1615    PT Time Calculation (min) 42 min    Equipment Utilized During Treatment Gait belt    Activity Tolerance Patient tolerated treatment well    Behavior During Therapy WFL for tasks assessed/performed             Past Medical History:  Diagnosis Date   Cholelithiasis    rare symptoms, declined surgery   Colitis ?1975   Hemorrhoids    Lichen sclerosus    sees gyn for management   Mitral valve prolapse    Osteoporosis    sees gyn   Right leg weakness    Varicose veins     Past Surgical History:  Procedure Laterality Date   jaw resection     RHINOPLASTY     TONSILLECTOMY     TUBAL LIGATION     bilteral    There were no vitals filed for this visit.   Subjective Assessment - 01/12/21 1536     Subjective No new complaints. Has been walking more with RW at home. Went to [employer] with no issues, able to go up/down the 17 steps. Did leave her HEP and tennis ball there.    Patient is accompained by: Family member   spouse in lobby   Pertinent History PMH: osteoporosis    How long can you stand comfortably? about 5 minutes    How long can you walk comfortably? approx. 4' with RW    Diagnostic tests MRI of the lumbar spine which showed mild to moderate spinal stenosis at L4-5 and some other degenerative changes. NCS on 11/26/20: Normal study.  No electrodiagnostic evidence of large fiber neuropathy at this time. MRI  cervical spine showed multilevel degenerative changes.  MRI brain was unremarkable.    Patient Stated Goals wants to get back to normal    Currently in Pain? No/denies   no pain, just "tightness" in right LE   Pain Score 0-No pain                    [facility] Adult PT Treatment/Exercise - 01/12/21 1538       Transfers   Transfers Stand to Sit;Sit to Stand    Sit to Stand 5: Supervision;From bed;From chair/3-in-1;Without upper extremity assist    Stand to Sit 5: Supervision;With upper extremity assist      Ambulation/Gait   Ambulation/Gait Yes    Ambulation/Gait Assistance 5: Supervision    Ambulation/Gait Assistance Details reminder cues on posture and walker position with gait.    Ambulation Distance (Feet) 30 Feet   x1,  20  x1, 65 x1   Assistive device Rolling walker    Gait Pattern Step-through pattern;Decreased stance time - right;Decreased step length - left;Decreased hip/knee flexion - right;Decreased weight shift to right;Antalgic    Ambulation Surface Level;Indoor      High Level Balance   High Level  Balance Activities Side stepping;Marching forwards;Backward walking    High Level Balance Comments in parallel bars with light UE support for 3 laps each/each way with cues on form and technique. min guard assist for safety.      Knee/Hip Exercises: Aerobic   Nustep Level 2 with LE's only (seat 10) x 8 minutes with goal >/= 60 steps per minute for strengthening and activity tolerance.                 Balance Exercises - 01/12/21 1601       Balance Exercises: Standing   Standing Eyes Opened Foam/compliant surface;Other reps (comment);Wide (BOA);Limitations    Standing Eyes Opened Limitations on airex with feet hip width apart: heel<>toe raises, alternating marching in place for 10 reps each. cues on posture and ex form    Standing Eyes Closed Narrow base of support (BOS);Foam/compliant surface;Other reps (comment);30 secs;Limitations;Wide (BOA);Head turns     Standing Eyes Closed Limitations on airex, no UE support: feet together for EC 30 sec's x3 reps, then with feet wider apart for EC head movements left<>right, up<>down for 6-7 rep each. min guard to min assist for balance.                  PT Short Term Goals - 12/29/20 1630       PT SHORT TERM GOAL #1   Title Pt and pt's spouse will be independent with initial HEP in order to build upon functional gains made in therapy. ALL STGS DUE 12/31/20    Baseline 12/29/20: met with current program    Status Achieved      PT SHORT TERM GOAL #2   Title Pt will undergo TUG with RW and LTG written in order to demo decr fall risk.    Baseline 12/29/20: 21.31 sec's with RW.Primary PT to set goal.    Status Achieved      PT SHORT TERM GOAL #3   Title Pt will improve gait speed with RW to at least 1.0 ft/sec in order to demo improved gait efficiency/decr fall risk.    Baseline 12/29/20: 1.27 ft/sec with RW.    Status Achieved      PT SHORT TERM GOAL #4   Title Pt will ambulate at least 115' with RW and supervision over level indoor surfaces in order to demo improved household mobility.    Baseline 12/29/20: met in session today    Status Achieved               PT Long Term Goals - 01/08/21 1149       PT LONG TERM GOAL #1   Title Pt and pt's spouse will be independent with final HEP in order to build upon functional gains made in therapy. ALL LTGS DUE 01/28/21    Time 8    Period Weeks    Status New      PT LONG TERM GOAL #2   Title Pt will decr TUG with RW.vs. no AD in 18 seconds or less in order to demo decr fall risk.    Baseline 21.31    Time 8    Period Weeks    Status Revised      PT LONG TERM GOAL #3   Title Pt will perform 5x sit <> stand in 15 seconds or less in order to demo improved functional BLE strength.    Baseline 18.12 seconds    Time 8    Period Weeks    Status New  PT LONG TERM GOAL #4   Title Pt will improve gait speed with RW to at least 2.0 ft/sec  in order to demo improved gait efficiency.    Baseline .58 ft/sec    Time 8    Period Weeks    Status New      PT LONG TERM GOAL #5   Title Pt will ambulate at least 97' with supervision and RW vs. LRAD in order to demo improved gait efficiency.    Baseline 50' with RW and min guard    Time 8    Period Weeks    Status New      PT LONG TERM GOAL #6   Title Pt will go up and down 8 steps with bilat vs. single handrail with step to pattern and supervision in order to safely go up the stairs in her house.    Baseline not yet performed.    Time 8    Period Weeks    Status New                   Plan - 01/12/21 1538     Clinical Impression Statement Today's skilled session continued to focus on strengthening, balance training and gait training with RW. No issues or increased pain reported in session. The pt is making steady progress and should benefit from continued PT to progress toward unmet goals.    Personal Factors and Comorbidities Comorbidity 1;Time since onset of injury/illness/exacerbation;Past/Current Experience    Comorbidities osteoporosis    Examination-Activity Limitations Locomotion Level;Lift;Dressing;Hygiene/Grooming;Bathing;Reach Overhead;Transfers;Stand;Stairs    Examination-Participation Restrictions Community Activity;Driving;Cleaning;Laundry    Stability/Clinical Decision Making Evolving/Moderate complexity    Rehab Potential Good    PT Frequency 2x / week    PT Duration 12 weeks    PT Treatment/Interventions ADLs/Self Care Home Management;Aquatic Therapy;Gait training;DME Instruction;Stair training;Functional mobility training;Therapeutic activities;Neuromuscular re-education;Balance training;Therapeutic exercise;Patient/family education;Passive range of motion;Manual techniques;Energy conservation;Vestibular    PT Next Visit Plan standing balance. gait training with RW. Scifit/Nustep for strengthening. BLE strength. stair training. STM to L  piriformis/glutes as needed - but making sure pt is doing her exercises and self mobilizations at home.    PT Home Exercise Plan [id number]    Consulted and Agree with Plan of Care Patient;Family member/caregiver    Family Member Consulted pt's husband             Patient will benefit from skilled therapeutic intervention in order to improve the following deficits and impairments:  Abnormal gait, Decreased balance, Decreased activity tolerance, Decreased mobility, Difficulty walking, Decreased strength, Impaired sensation, Pain, Postural dysfunction  Visit Diagnosis: Muscle weakness (generalized)  Difficulty in walking, not elsewhere classified  Other abnormalities of gait and mobility  Unsteadiness on feet  Pain in left hip     Problem List Patient Active Problem List   Diagnosis Date Noted   Histoplasmosis 08/21/2019   Chronic left shoulder pain 08/03/2017   Aortic atherosclerosis ([place]) 07/18/2017   Cholelithiasis 03/20/2012   Osteoporosis 08/09/2006   Willow Ora, PTA, [facility] [address] [place],  [address] [phone] 01/13/21, 2:35 PM   Name: Brandi Villarreal MRN: [id number] Date of Birth: 1945-09-29

## 2021-01-15 ENCOUNTER — Ambulatory Visit: Payer: Medicare Other

## 2021-01-15 ENCOUNTER — Other Ambulatory Visit: Payer: Self-pay

## 2021-01-15 DIAGNOSIS — M25552 Pain in left hip: Secondary | ICD-10-CM | POA: Diagnosis not present

## 2021-01-15 DIAGNOSIS — R2681 Unsteadiness on feet: Secondary | ICD-10-CM | POA: Diagnosis not present

## 2021-01-15 DIAGNOSIS — M6281 Muscle weakness (generalized): Secondary | ICD-10-CM | POA: Diagnosis not present

## 2021-01-15 DIAGNOSIS — R262 Difficulty in walking, not elsewhere classified: Secondary | ICD-10-CM | POA: Diagnosis not present

## 2021-01-15 DIAGNOSIS — R2689 Other abnormalities of gait and mobility: Secondary | ICD-10-CM

## 2021-01-15 DIAGNOSIS — M25551 Pain in right hip: Secondary | ICD-10-CM | POA: Diagnosis not present

## 2021-01-15 NOTE — Therapy (Signed)
[facility] [address] [place], [place], [address] Phone: [phone]   Fax:  [phone]  Physical Therapy Treatment  Patient Details  Name: Brandi Villarreal MRN: [id number] Date of Birth: 08-Jul-1945 Referring Provider (PT): Dr. Leta Baptist   Encounter Date: 01/15/2021   PT End of Session - 01/15/21 0924     Visit Number 12    Number of Visits 17    Date for PT Re-Evaluation 03/03/21    Authorization Type [employer]HC Medicare - will need 10th visit PN    PT Start Time 0855    PT Stop Time 0930    PT Time Calculation (min) 35 min    Equipment Utilized During Treatment Gait belt    Activity Tolerance Patient tolerated treatment well    Behavior During Therapy WFL for tasks assessed/performed             Past Medical History:  Diagnosis Date   Cholelithiasis    rare symptoms, declined surgery   Colitis ?1975   Hemorrhoids    Lichen sclerosus    sees gyn for management   Mitral valve prolapse    Osteoporosis    sees gyn   Right leg weakness    Varicose veins     Past Surgical History:  Procedure Laterality Date   jaw resection     RHINOPLASTY     TONSILLECTOMY     TUBAL LIGATION     bilteral    There were no vitals filed for this visit.   Subjective Assessment - 01/15/21 0857     Subjective No changes since last session, no falls to note.    Patient is accompained by: Family member   spouse in lobby   Pertinent History PMH: osteoporosis    Limitations Walking;Standing;House hold activities    How long can you stand comfortably? about 5 minutes    How long can you walk comfortably? approx. 34' with RW    Diagnostic tests MRI of the lumbar spine which showed mild to moderate spinal stenosis at L4-5 and some other degenerative changes. NCS on 11/26/20: Normal study.  No electrodiagnostic evidence of large fiber neuropathy at this time. MRI cervical spine showed multilevel degenerative changes.  MRI brain  was unremarkable.    Patient Stated Goals wants to get back to normal    Pain Onset In the past 7 days                               [facility] Adult PT Treatment/Exercise - 01/15/21 0001       Transfers   Transfers Stand to Sit;Sit to Stand    Sit to Stand 5: Supervision    Stand to Sit 5: Supervision    Comments 10x with OH reach once standing      Ambulation/Gait   Ambulation/Gait Yes    Ambulation/Gait Assistance 4: Min guard    Ambulation Distance (Feet) 345 Feet    Assistive device Rolling walker    Gait Pattern Step-through pattern;Decreased stance time - right;Decreased step length - left;Decreased hip/knee flexion - right;Decreased weight shift to right;Antalgic    Ambulation Surface Level;Indoor      Knee/Hip Exercises: Sidelying   Clams light manual resistance 3x10      Manual Therapy   Manual Therapy Soft tissue mobilization    Manual therapy comments piriformid TP release with elbow 8' duration  PT Short Term Goals - 12/29/20 1630       PT SHORT TERM GOAL #1   Title Pt and pt's spouse will be independent with initial HEP in order to build upon functional gains made in therapy. ALL STGS DUE 12/31/20    Baseline 12/29/20: met with current program    Status Achieved      PT SHORT TERM GOAL #2   Title Pt will undergo TUG with RW and LTG written in order to demo decr fall risk.    Baseline 12/29/20: 21.31 sec's with RW.Primary PT to set goal.    Status Achieved      PT SHORT TERM GOAL #3   Title Pt will improve gait speed with RW to at least 1.0 ft/sec in order to demo improved gait efficiency/decr fall risk.    Baseline 12/29/20: 1.27 ft/sec with RW.    Status Achieved      PT SHORT TERM GOAL #4   Title Pt will ambulate at least 115' with RW and supervision over level indoor surfaces in order to demo improved household mobility.    Baseline 12/29/20: met in session today    Status Achieved                PT Long Term Goals - 01/08/21 1149       PT LONG TERM GOAL #1   Title Pt and pt's spouse will be independent with final HEP in order to build upon functional gains made in therapy. ALL LTGS DUE 01/28/21    Time 8    Period Weeks    Status New      PT LONG TERM GOAL #2   Title Pt will decr TUG with RW.vs. no AD in 18 seconds or less in order to demo decr fall risk.    Baseline 21.31    Time 8    Period Weeks    Status Revised      PT LONG TERM GOAL #3   Title Pt will perform 5x sit <> stand in 15 seconds or less in order to demo improved functional BLE strength.    Baseline 18.12 seconds    Time 8    Period Weeks    Status New      PT LONG TERM GOAL #4   Title Pt will improve gait speed with RW to at least 2.0 ft/sec in order to demo improved gait efficiency.    Baseline .58 ft/sec    Time 8    Period Weeks    Status New      PT LONG TERM GOAL #5   Title Pt will ambulate at least 64' with supervision and RW vs. LRAD in order to demo improved gait efficiency.    Baseline 50' with RW and min guard    Time 8    Period Weeks    Status New      PT LONG TERM GOAL #6   Title Pt will go up and down 8 steps with bilat vs. single handrail with step to pattern and supervision in order to safely go up the stairs in her house.    Baseline not yet performed.    Time 8    Period Weeks    Status New                   Plan - 01/15/21 0925     Clinical Impression Statement Today focus was ambulation tolerance with RW covering 353f with CGA.  Continued piriformis release to R as well as localized strengthening of R gluteus medius against light manual resistance.  Good balance noted wit gait and may be able to attempt ambulation with cane    Personal Factors and Comorbidities Comorbidity 1;Time since onset of injury/illness/exacerbation;Past/Current Experience    Comorbidities osteoporosis    Examination-Activity Limitations Locomotion  Level;Lift;Dressing;Hygiene/Grooming;Bathing;Reach Overhead;Transfers;Stand;Stairs    Examination-Participation Restrictions Community Activity;Driving;Cleaning;Laundry    Stability/Clinical Decision Making Evolving/Moderate complexity    Rehab Potential Good    PT Frequency 2x / week    PT Duration 12 weeks    PT Treatment/Interventions ADLs/Self Care Home Management;Aquatic Therapy;Gait training;DME Instruction;Stair training;Functional mobility training;Therapeutic activities;Neuromuscular re-education;Balance training;Therapeutic exercise;Patient/family education;Passive range of motion;Manual techniques;Energy conservation;Vestibular    PT Next Visit Plan standing balance. gait training with RW. Scifit/Nustep for strengthening. BLE strength. stair training. STM to L piriformis/glutes as needed - but making sure pt is doing her exercises and self mobilizations at home. Gait with cane?    PT Home Exercise Plan [id number]    Consulted and Agree with Plan of Care Patient;Family member/caregiver    Family Member Consulted pt's husband             Patient will benefit from skilled therapeutic intervention in order to improve the following deficits and impairments:  Abnormal gait, Decreased balance, Decreased activity tolerance, Decreased mobility, Difficulty walking, Decreased strength, Impaired sensation, Pain, Postural dysfunction  Visit Diagnosis: Difficulty in walking, not elsewhere classified  Unsteadiness on feet  Other abnormalities of gait and mobility     Problem List Patient Active Problem List   Diagnosis Date Noted   Histoplasmosis 08/21/2019   Chronic left shoulder pain 08/03/2017   Aortic atherosclerosis ([place]) 07/18/2017   Cholelithiasis 03/20/2012   Osteoporosis 08/09/2006    Lanice Shirts, PT 01/15/2021, 9:29 AM  [facility] [address] [place], [place], [address] Phone: [phone]   Fax:   [phone]  Name: MCKINLEE DUNK MRN: [id number] Date of Birth: 1946/03/30

## 2021-01-19 ENCOUNTER — Other Ambulatory Visit (HOSPITAL_COMMUNITY): Payer: Self-pay | Admitting: Family Medicine

## 2021-01-19 ENCOUNTER — Other Ambulatory Visit: Payer: Self-pay

## 2021-01-19 ENCOUNTER — Ambulatory Visit: Payer: Medicare Other | Admitting: Physical Therapy

## 2021-01-19 ENCOUNTER — Encounter: Payer: Self-pay | Admitting: Physical Therapy

## 2021-01-19 ENCOUNTER — Other Ambulatory Visit: Payer: Self-pay | Admitting: Family Medicine

## 2021-01-19 DIAGNOSIS — R2681 Unsteadiness on feet: Secondary | ICD-10-CM | POA: Diagnosis not present

## 2021-01-19 DIAGNOSIS — M25551 Pain in right hip: Secondary | ICD-10-CM

## 2021-01-19 DIAGNOSIS — M25561 Pain in right knee: Secondary | ICD-10-CM | POA: Diagnosis not present

## 2021-01-19 DIAGNOSIS — M25552 Pain in left hip: Secondary | ICD-10-CM | POA: Diagnosis not present

## 2021-01-19 DIAGNOSIS — R262 Difficulty in walking, not elsewhere classified: Secondary | ICD-10-CM | POA: Diagnosis not present

## 2021-01-19 DIAGNOSIS — M6281 Muscle weakness (generalized): Secondary | ICD-10-CM | POA: Diagnosis not present

## 2021-01-19 DIAGNOSIS — R2689 Other abnormalities of gait and mobility: Secondary | ICD-10-CM

## 2021-01-19 NOTE — Therapy (Addendum)
[facility] [address] [place], [place], [address] Phone: [phone]   Fax:  [phone]  Physical Therapy Treatment  Patient Details  Name: Brandi Villarreal MRN: [id number] Date of Birth: [birth year] Referring Provider (PT): Dr. Leta Baptist   Encounter Date: 01/19/2021   PT End of Session - 01/19/21 1538     Visit Number 13    Number of Visits 17    Date for PT Re-Evaluation 03/03/21    Authorization Type [employer] Medicare - will need 10th visit PN    PT Start Time 1535    PT Stop Time 1615    PT Time Calculation (min) 40 min    Equipment Utilized During Treatment Gait belt    Activity Tolerance Patient tolerated treatment well    Behavior During Therapy WFL for tasks assessed/performed             Past Medical History:  Diagnosis Date   Cholelithiasis    rare symptoms, declined surgery   Colitis ?1975   Hemorrhoids    Lichen sclerosus    sees gyn for management   Mitral valve prolapse    Osteoporosis    sees gyn   Right leg weakness    Varicose veins     Past Surgical History:  Procedure Laterality Date   jaw resection     RHINOPLASTY     TONSILLECTOMY     TUBAL LIGATION     bilteral    There were no vitals filed for this visit.   Subjective Assessment - 01/19/21 1537     Subjective Saw the ortho doctor today - going to order an MRI of pt's hip. No longer taking Gabapentin.    Patient is accompained by: Family member   spouse in lobby   Pertinent History PMH: osteoporosis    Limitations Walking;Standing;House hold activities    How long can you stand comfortably? about 5 minutes    How long can you walk comfortably? approx. 78' with RW    Diagnostic tests MRI of the lumbar spine which showed mild to moderate spinal stenosis at L4-5 and some other degenerative changes. NCS on 11/26/20: Normal study.  No electrodiagnostic evidence of large fiber neuropathy at this time. MRI cervical spine showed  multilevel degenerative changes.  MRI brain was unremarkable.    Patient Stated Goals wants to get back to normal    Currently in Pain? No/denies    Pain Onset In the past 7 days                               [facility] Adult PT Treatment/Exercise - 01/19/21 1551       Ambulation/Gait   Ambulation/Gait Yes    Ambulation/Gait Assistance 5: Supervision    Ambulation/Gait Assistance Details Cues for posture    Ambulation Distance (Feet) 230 Feet   115 x 1   Assistive device Rolling walker    Gait Pattern Step-through pattern;Decreased stance time - right;Decreased step length - left;Decreased hip/knee flexion - right;Decreased weight shift to right;Antalgic    Ambulation Surface Level;Indoor      Exercises   Exercises Other Exercises    Other Exercises  Standing calf stretch 2 x 30 seconds bilat, Seated on blue physioball with min guard; weight shifting laterally x10 reps, clockwise and counterclockwise circles x10 reps, alternating LAQs x5 reps bilat - incr difficulty when weight shifting to RLE. Pt reporting no pain when performing.  Balance Exercises - 01/19/21 1552       Balance Exercises: Standing   SLS Eyes open;Upper extremity support 2;Limitations    SLS Limitations Alternating toe taps to 2 cones, x12 reps bilat, fingertip support, cues for incr hip/knee flexion vs. trunk lean    Rockerboard Anterior/posterior;EO;Limitations    Rockerboard Limitations Weight shifting with BUE support: x10 reps, then holding in posterior direction for 10 seconds for calf stretch x8 reps. Keeping board steady x30 seconds, alternating UE lifts x5 reps bilat    Step Ups Forward;4 inch;UE support 2;Limitations    Step Ups Limitations x10 reps alternating legs with BUE support, then an additional x5 reps bilat performing march with contralateral leg    Other Standing Exercises In // bars: Forward gait no UE support x3 reps, retro gait with intermittent UE  support x3 reps, cues for step length, decr stance time on RLE                  PT Short Term Goals - 12/29/20 1630       PT SHORT TERM GOAL #1   Title Pt and pt's spouse will be independent with initial HEP in order to build upon functional gains made in therapy. ALL STGS DUE 12/31/20    Baseline 12/29/20: met with current program    Status Achieved      PT SHORT TERM GOAL #2   Title Pt will undergo TUG with RW and LTG written in order to demo decr fall risk.    Baseline 12/29/20: 21.31 sec's with RW.Primary PT to set goal.    Status Achieved      PT SHORT TERM GOAL #3   Title Pt will improve gait speed with RW to at least 1.0 ft/sec in order to demo improved gait efficiency/decr fall risk.    Baseline 12/29/20: 1.27 ft/sec with RW.    Status Achieved      PT SHORT TERM GOAL #4   Title Pt will ambulate at least 115' with RW and supervision over level indoor surfaces in order to demo improved household mobility.    Baseline 12/29/20: met in session today    Status Achieved               PT Long Term Goals - 01/08/21 1149       PT LONG TERM GOAL #1   Title Pt and pt's spouse will be independent with final HEP in order to build upon functional gains made in therapy. ALL LTGS DUE 01/28/21    Time 8    Period Weeks    Status New      PT LONG TERM GOAL #2   Title Pt will decr TUG with RW.vs. no AD in 18 seconds or less in order to demo decr fall risk.    Baseline 21.31    Time 8    Period Weeks    Status Revised      PT LONG TERM GOAL #3   Title Pt will perform 5x sit <> stand in 15 seconds or less in order to demo improved functional BLE strength.    Baseline 18.12 seconds    Time 8    Period Weeks    Status New      PT LONG TERM GOAL #4   Title Pt will improve gait speed with RW to at least 2.0 ft/sec in order to demo improved gait efficiency.    Baseline .58 ft/sec    Time 8  Period Weeks    Status New      PT LONG TERM GOAL #5   Title Pt will  ambulate at least 54' with supervision and RW vs. LRAD in order to demo improved gait efficiency.    Baseline 50' with RW and min guard    Time 8    Period Weeks    Status New      PT LONG TERM GOAL #6   Title Pt will go up and down 8 steps with bilat vs. single handrail with step to pattern and supervision in order to safely go up the stairs in her house.    Baseline not yet performed.    Time 8    Period Weeks    Status New                   Plan - 01/20/21 0825     Clinical Impression Statement Today's skilled session focused on standing balance strategies working on decr UE support and seated weight shifting on compliant surfaces for balance/core activation. Pt more fatigued during this session due to having a busy day of appts, needing seated rest breaks. Attempted gait in // bars with no UE support/AD and pt demonstrating an antalgic pattern with decr stance time on RLE. Will continue to progress towards LTGs.    Personal Factors and Comorbidities Comorbidity 1;Time since onset of injury/illness/exacerbation;Past/Current Experience    Comorbidities osteoporosis    Examination-Activity Limitations Locomotion Level;Lift;Dressing;Hygiene/Grooming;Bathing;Reach Overhead;Transfers;Stand;Stairs    Examination-Participation Restrictions Community Activity;Driving;Cleaning;Laundry    Stability/Clinical Decision Making Evolving/Moderate complexity    Rehab Potential Good    PT Frequency 2x / week    PT Duration 12 weeks    PT Treatment/Interventions ADLs/Self Care Home Management;Aquatic Therapy;Gait training;DME Instruction;Stair training;Functional mobility training;Therapeutic activities;Neuromuscular re-education;Balance training;Therapeutic exercise;Patient/family education;Passive range of motion;Manual techniques;Energy conservation;Vestibular    PT Next Visit Plan standing balance. gait training with RW. Scifit/Nustep for strengthening. BLE strength. stair training. STM to L  piriformis/glutes as needed - but making sure pt is doing her exercises and self mobilizations at home. Gait with cane?    PT Home Exercise Plan [id number]    Consulted and Agree with Plan of Care Patient;Family member/caregiver    Family Member Consulted pt's husband             Patient will benefit from skilled therapeutic intervention in order to improve the following deficits and impairments:  Abnormal gait, Decreased balance, Decreased activity tolerance, Decreased mobility, Difficulty walking, Decreased strength, Impaired sensation, Pain, Postural dysfunction  Visit Diagnosis: Unsteadiness on feet  Other abnormalities of gait and mobility  Difficulty in walking, not elsewhere classified  Muscle weakness (generalized)     Problem List Patient Active Problem List   Diagnosis Date Noted   Histoplasmosis 08/21/2019   Chronic left shoulder pain 08/03/2017   Aortic atherosclerosis ([place]) 07/18/2017   Cholelithiasis 03/20/2012   Osteoporosis 08/09/2006    Arliss Journey, PT,DPT 01/20/2021, 8:27 AM  [facility] [address] [place], [place], [address] Phone: [phone]   Fax:  [phone]  Name: Brandi Villarreal MRN: [id number] Date of Birth: Aug 15, 1945

## 2021-01-22 ENCOUNTER — Encounter: Payer: Self-pay | Admitting: Physical Therapy

## 2021-01-22 ENCOUNTER — Ambulatory Visit: Payer: Medicare Other | Admitting: Physical Therapy

## 2021-01-22 ENCOUNTER — Other Ambulatory Visit: Payer: Self-pay

## 2021-01-22 DIAGNOSIS — R262 Difficulty in walking, not elsewhere classified: Secondary | ICD-10-CM | POA: Diagnosis not present

## 2021-01-22 DIAGNOSIS — R2681 Unsteadiness on feet: Secondary | ICD-10-CM | POA: Diagnosis not present

## 2021-01-22 DIAGNOSIS — M6281 Muscle weakness (generalized): Secondary | ICD-10-CM | POA: Diagnosis not present

## 2021-01-22 DIAGNOSIS — R2689 Other abnormalities of gait and mobility: Secondary | ICD-10-CM

## 2021-01-22 DIAGNOSIS — M25552 Pain in left hip: Secondary | ICD-10-CM | POA: Diagnosis not present

## 2021-01-22 DIAGNOSIS — M25551 Pain in right hip: Secondary | ICD-10-CM | POA: Diagnosis not present

## 2021-01-22 NOTE — Therapy (Signed)
[facility] [address] [place], [place], [address] Phone: [phone]   Fax:  [phone]  Physical Therapy Treatment  Patient Details  Name: ANAYELLI LAI MRN: [id number] Date of Birth: 1945/08/27 Referring Provider (PT): Dr. Leta Baptist   Encounter Date: 01/22/2021   PT End of Session - 01/22/21 0942     Visit Number 14    Number of Visits 17    Date for PT Re-Evaluation 03/03/21    Authorization Type [employer] Medicare - will need 10th visit PN    PT Start Time 0847    PT Stop Time 0930    PT Time Calculation (min) 43 min    Equipment Utilized During Treatment Gait belt    Activity Tolerance Patient tolerated treatment well;Patient limited by pain    Behavior During Therapy WFL for tasks assessed/performed             Past Medical History:  Diagnosis Date   Cholelithiasis    rare symptoms, declined surgery   Colitis ?1975   Hemorrhoids    Lichen sclerosus    sees gyn for management   Mitral valve prolapse    Osteoporosis    sees gyn   Right leg weakness    Varicose veins     Past Surgical History:  Procedure Laterality Date   jaw resection     RHINOPLASTY     TONSILLECTOMY     TUBAL LIGATION     bilteral    There were no vitals filed for this visit.   Subjective Assessment - 01/22/21 0849     Subjective Going to get an MRI of her R hip next Wednesday.    Patient is accompained by: Family member   spouse in lobby   Pertinent History PMH: osteoporosis    Limitations Walking;Standing;House hold activities    How long can you stand comfortably? about 5 minutes    How long can you walk comfortably? approx. 8' with RW    Diagnostic tests MRI of the lumbar spine which showed mild to moderate spinal stenosis at L4-5 and some other degenerative changes. NCS on 11/26/20: Normal study.  No electrodiagnostic evidence of large fiber neuropathy at this time. MRI cervical spine showed multilevel  degenerative changes.  MRI brain was unremarkable.    Patient Stated Goals wants to get back to normal    Currently in Pain? No/denies   "just tingling down R leg"   Pain Onset In the past 7 days                               [facility] Adult PT Treatment/Exercise - 01/22/21 0946       Therapeutic Activites    Therapeutic Activities Other Therapeutic Activities    Other Therapeutic Activities Discussed use of seated peddle bike/stepper for home use for strengthening/ROM. Trialed ones in clinic with pt reporting incr discomfort with peddle bike, but able to tolerated stepper much better, pt performing for 5 minutes with BLE. Showed pt's husband options of where one can be purchased from [place] pt's husband also stating that they are members of [employer] and can possibly use a stepper at the gym      Exercises   Exercises Other Exercises    Other Exercises  Attempted hip flexor/quad stretch to RLE off mat table, pt unable to tolerate >15 seconds due to incr pain (pt's leg barely off mat table and supported  by therapist's leg). Performed sidelying hip flexor stretch to pt's tolerance 2 x 30 seconds. Bridging with yoga block x10 reps, cues for tehcnique and isometric hold, full ROM, pt with incr pain. x10 reps RLE clamshells with light therapist resistance. Reviewed long sitting hip ADD stretch with cues for technique and 30 second hold as pt had not been performing consistently at home.      Manual Therapy   Manual Therapy Soft tissue mobilization    Manual therapy comments Pt in prone, R quad/hip flexor stretch with contract relax 3 x 30 seconds to pt's tolerance.    Soft tissue mobilization Using small foam roller to pt's R hip flexor/quadriceps to pt's tolerance. Pt's husband purchasing small foam roller during session to perform this with pt at home.                     PT Education - 01/22/21 [phone]     Education Details Reviewed importance of performing  stretches/exercises at home consistently as well as pt's spouse helping with ice massage/soft tissue mobilization using small foam roll.    Person(s) Educated Patient;Spouse    Methods Explanation;Demonstration;Handout    Comprehension Verbalized understanding;Returned demonstration              PT Short Term Goals - 12/29/20 1630       PT SHORT TERM GOAL #1   Title Pt and pt's spouse will be independent with initial HEP in order to build upon functional gains made in therapy. ALL STGS DUE 12/31/20    Baseline 12/29/20: met with current program    Status Achieved      PT SHORT TERM GOAL #2   Title Pt will undergo TUG with RW and LTG written in order to demo decr fall risk.    Baseline 12/29/20: 21.31 sec's with RW.Primary PT to set goal.    Status Achieved      PT SHORT TERM GOAL #3   Title Pt will improve gait speed with RW to at least 1.0 ft/sec in order to demo improved gait efficiency/decr fall risk.    Baseline 12/29/20: 1.27 ft/sec with RW.    Status Achieved      PT SHORT TERM GOAL #4   Title Pt will ambulate at least 115' with RW and supervision over level indoor surfaces in order to demo improved household mobility.    Baseline 12/29/20: met in session today    Status Achieved               PT Long Term Goals - 01/08/21 1149       PT LONG TERM GOAL #1   Title Pt and pt's spouse will be independent with final HEP in order to build upon functional gains made in therapy. ALL LTGS DUE 01/28/21    Time 8    Period Weeks    Status New      PT LONG TERM GOAL #2   Title Pt will decr TUG with RW.vs. no AD in 18 seconds or less in order to demo decr fall risk.    Baseline 21.31    Time 8    Period Weeks    Status Revised      PT LONG TERM GOAL #3   Title Pt will perform 5x sit <> stand in 15 seconds or less in order to demo improved functional BLE strength.    Baseline 18.12 seconds    Time 8    Period Weeks  Status New      PT LONG TERM GOAL #4   Title Pt  will improve gait speed with RW to at least 2.0 ft/sec in order to demo improved gait efficiency.    Baseline .58 ft/sec    Time 8    Period Weeks    Status New      PT LONG TERM GOAL #5   Title Pt will ambulate at least 16' with supervision and RW vs. LRAD in order to demo improved gait efficiency.    Baseline 50' with RW and min guard    Time 8    Period Weeks    Status New      PT LONG TERM GOAL #6   Title Pt will go up and down 8 steps with bilat vs. single handrail with step to pattern and supervision in order to safely go up the stairs in her house.    Baseline not yet performed.    Time 8    Period Weeks    Status New                   Plan - 01/22/21 0950     Clinical Impression Statement Showed options for use of seated peddle/stepper bike at home for BLE strengthening/ROM. Pt unable to tolerate seated peddle bike, but did like the stepper. Showed possible purchase options for home. Pt limited today due to incr pain, esp in R hip flexor and piriformis. Continued to reiterate what streches and soft tissue mobilizations with appropriate tools pt/pt's spouse can be performing at home to help with muscular tightness/pain.    Personal Factors and Comorbidities Comorbidity 1;Time since onset of injury/illness/exacerbation;Past/Current Experience    Comorbidities osteoporosis    Examination-Activity Limitations Locomotion Level;Lift;Dressing;Hygiene/Grooming;Bathing;Reach Overhead;Transfers;Stand;Stairs    Examination-Participation Restrictions Community Activity;Driving;Cleaning;Laundry    Stability/Clinical Decision Making Evolving/Moderate complexity    Rehab Potential Good    PT Frequency 2x / week    PT Duration 12 weeks    PT Treatment/Interventions ADLs/Self Care Home Management;Aquatic Therapy;Gait training;DME Instruction;Stair training;Functional mobility training;Therapeutic activities;Neuromuscular re-education;Balance training;Therapeutic  exercise;Patient/family education;Passive range of motion;Manual techniques;Energy conservation;Vestibular    PT Next Visit Plan check LTGs and figure out plan, thinking about D/C and having pt return when pain is more under control,    PT Home Exercise Plan [id number]    Consulted and Agree with Plan of Care Patient;Family member/caregiver    Family Member Consulted pt's husband             Patient will benefit from skilled therapeutic intervention in order to improve the following deficits and impairments:  Abnormal gait, Decreased balance, Decreased activity tolerance, Decreased mobility, Difficulty walking, Decreased strength, Impaired sensation, Pain, Postural dysfunction  Visit Diagnosis: Unsteadiness on feet  Other abnormalities of gait and mobility  Difficulty in walking, not elsewhere classified     Problem List Patient Active Problem List   Diagnosis Date Noted   Histoplasmosis 08/21/2019   Chronic left shoulder pain 08/03/2017   Aortic atherosclerosis ([place]) 07/18/2017   Cholelithiasis 03/20/2012   Osteoporosis 08/09/2006    Arliss Journey, PT, DPT  01/22/2021, 9:53 AM  [facility] [address] [place], [place], [address] Phone: [phone]   Fax:  [phone]  Name: ELENI FRANK MRN: [id number] Date of Birth: 22-May-1945

## 2021-01-26 ENCOUNTER — Other Ambulatory Visit: Payer: Self-pay

## 2021-01-26 ENCOUNTER — Ambulatory Visit: Payer: Medicare Other | Admitting: Physical Therapy

## 2021-01-26 ENCOUNTER — Encounter: Payer: Self-pay | Admitting: Physical Therapy

## 2021-01-26 DIAGNOSIS — R2689 Other abnormalities of gait and mobility: Secondary | ICD-10-CM | POA: Diagnosis not present

## 2021-01-26 DIAGNOSIS — M6281 Muscle weakness (generalized): Secondary | ICD-10-CM | POA: Diagnosis not present

## 2021-01-26 DIAGNOSIS — R2681 Unsteadiness on feet: Secondary | ICD-10-CM

## 2021-01-26 DIAGNOSIS — R262 Difficulty in walking, not elsewhere classified: Secondary | ICD-10-CM | POA: Diagnosis not present

## 2021-01-26 DIAGNOSIS — M25551 Pain in right hip: Secondary | ICD-10-CM | POA: Diagnosis not present

## 2021-01-26 DIAGNOSIS — M25552 Pain in left hip: Secondary | ICD-10-CM | POA: Diagnosis not present

## 2021-01-27 ENCOUNTER — Ambulatory Visit (HOSPITAL_COMMUNITY)
Admission: RE | Admit: 2021-01-27 | Discharge: 2021-01-27 | Disposition: A | Discharge status: Home / Self Care | Payer: Medicare Other | Source: Ambulatory Visit | Attending: Family Medicine | Admitting: Family Medicine

## 2021-01-27 DIAGNOSIS — R200 Anesthesia of skin: Secondary | ICD-10-CM | POA: Diagnosis not present

## 2021-01-27 DIAGNOSIS — M25551 Pain in right hip: Secondary | ICD-10-CM | POA: Diagnosis not present

## 2021-01-27 DIAGNOSIS — M1611 Unilateral primary osteoarthritis, right hip: Secondary | ICD-10-CM | POA: Diagnosis not present

## 2021-01-27 DIAGNOSIS — M25451 Effusion, right hip: Secondary | ICD-10-CM | POA: Diagnosis not present

## 2021-01-27 DIAGNOSIS — M79604 Pain in right leg: Secondary | ICD-10-CM | POA: Diagnosis not present

## 2021-01-27 IMAGING — MR MR HIP*R* W/O CM
6 series · 39 of 40 positions shown · non-contrast
Comparison: None.

CLINICAL DATA: Persistent right leg/groin pain and numbness

EXAM:
MR OF THE RIGHT HIP WITHOUT CONTRAST
TECHNIQUE: Multiplanar, multisequence MR imaging was performed. No intravenous
contrast was administered.

[Series 8: T1 · coronal · right · 3.0mm · 0.78mm/px · 6 of 36 slices shown]
[im 1/36]
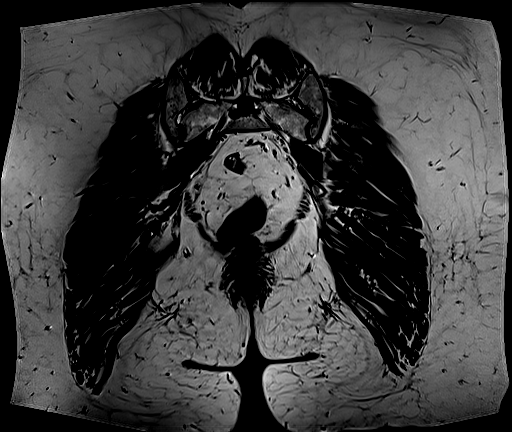
[im 6/36]
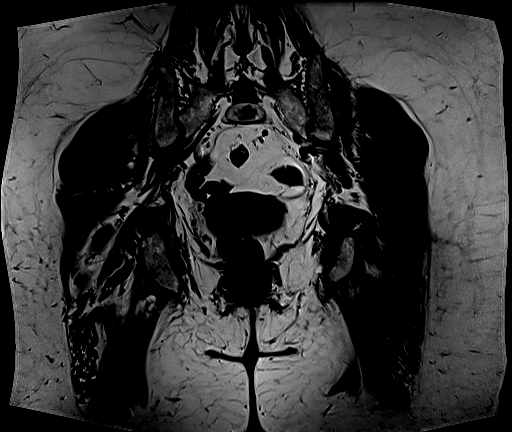
[im 12/36]
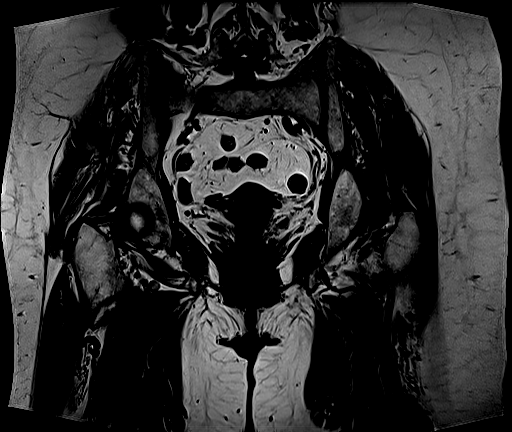
[im 18/36]
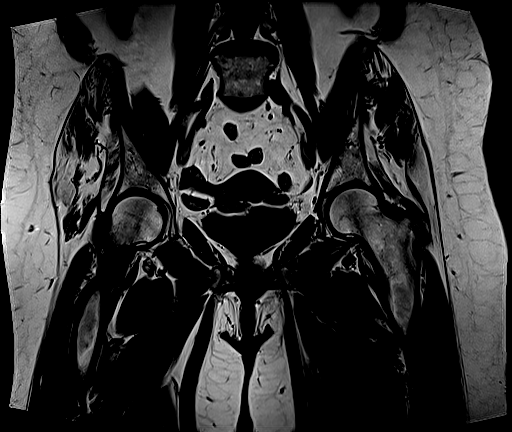
[im 24/36]
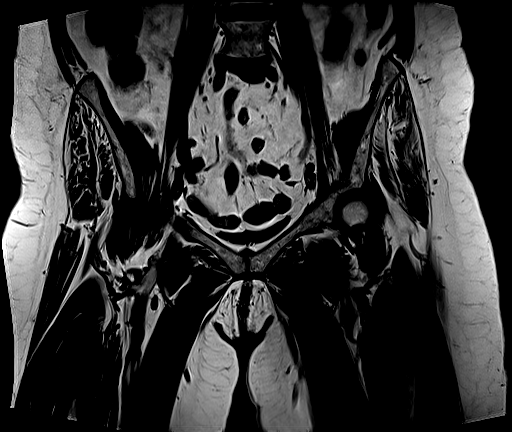
[im 30/36]
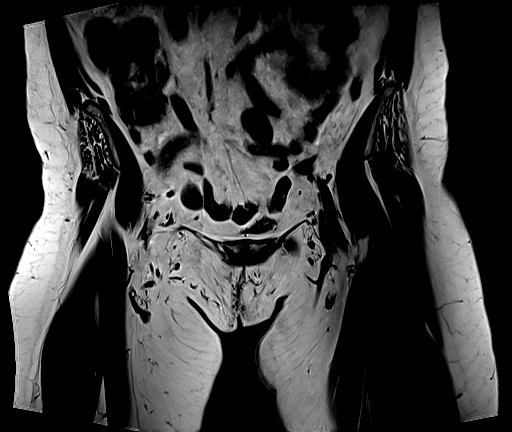

[Series 9: T2 fat-sat · coronal · right · 3.0mm · 1.25mm/px · 7 of 36 slices shown (1 of 2)]
[im 1/36]
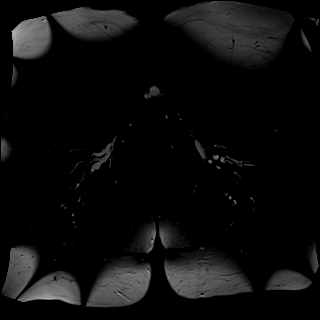
[im 6/36]
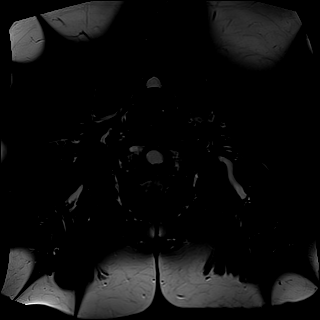
[im 12/36]
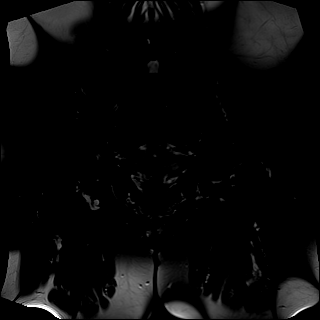
[im 18/36]
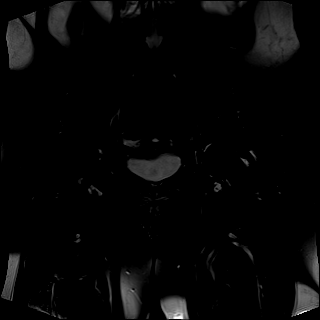
[im 24/36]
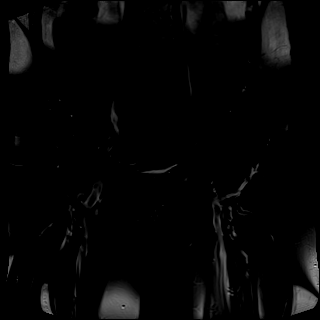
[im 30/36]
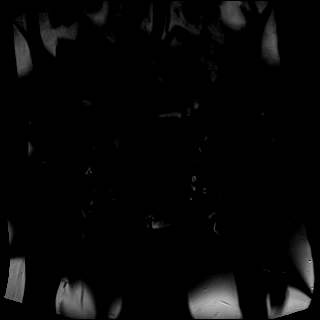
[im 36/36]
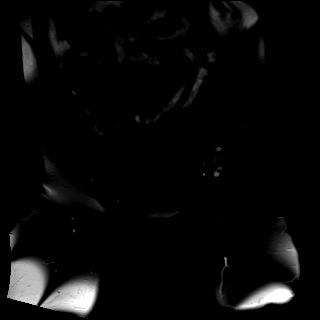

[Series 10: T2 fat-sat · axial · right · 4.0mm · 1.12mm/px · z∈[-99,+95]mm · 8 of 40 slices shown (2 of 2)]
[im 1/40]
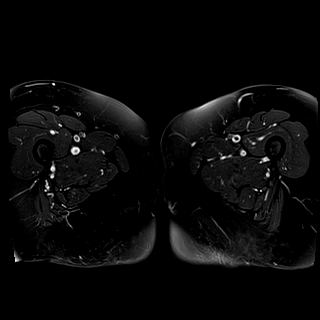
[im 6/40]
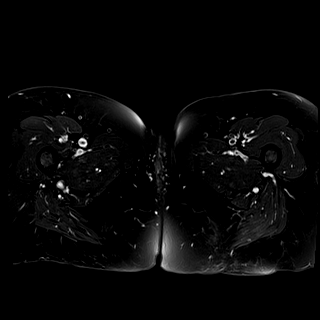
[im 12/40]
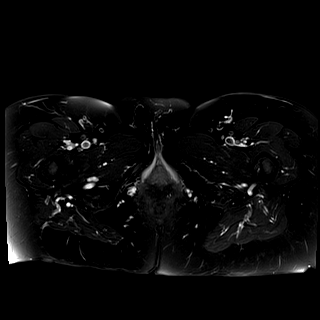
[im 17/40]
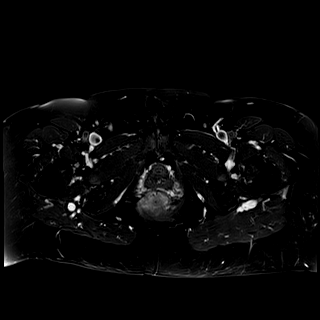
[im 23/40]
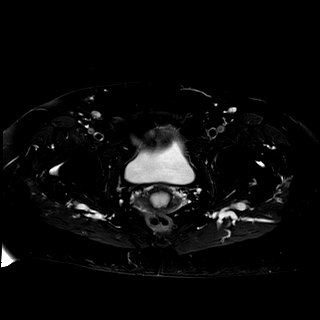
[im 28/40]
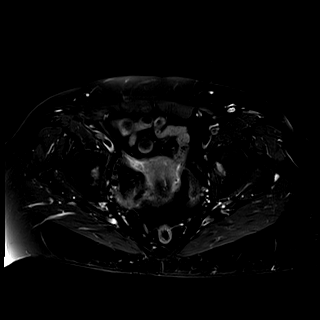
[im 34/40]
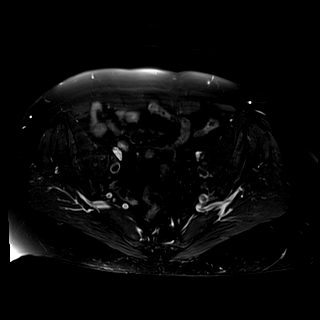
[im 40/40]
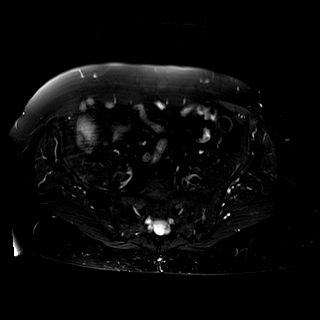

[Series 11: PD fat-sat · coronal · right · 4.0mm · 0.56mm/px · 4 of 22 slices shown (1 of 2)]
[im 1/22]
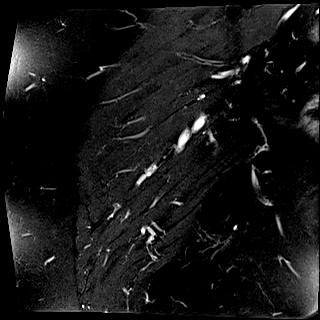
[im 8/22]
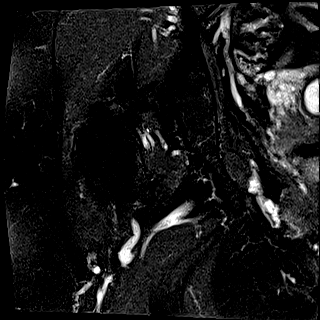
[im 15/22]
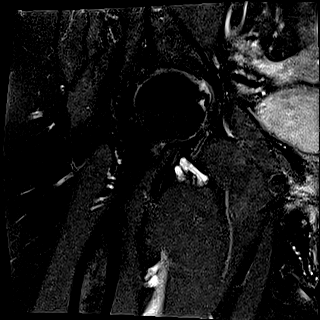
[im 22/22]
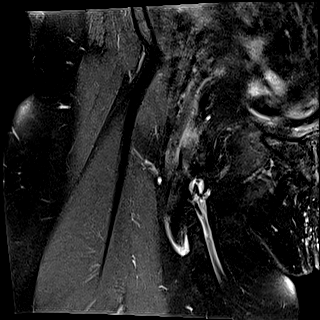

[Series 12: PD · axial · right · 4.0mm · 0.56mm/px · z∈[-112,+82]mm · 8 of 40 slices shown]
[im 1/40]
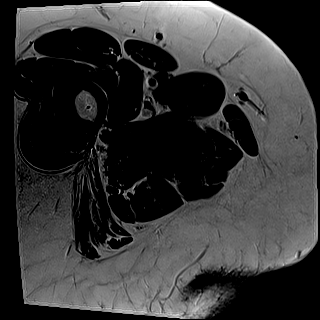
[im 6/40]
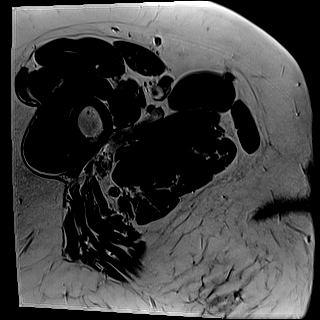
[im 12/40]
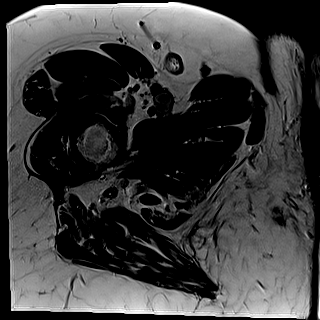
[im 17/40]
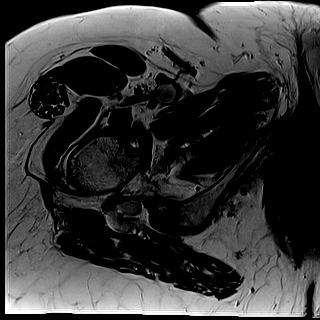
[im 23/40]
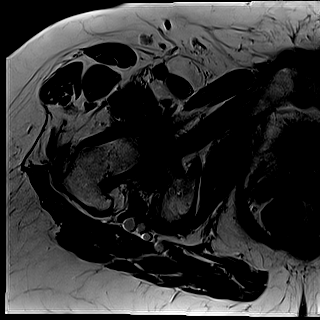
[im 28/40]
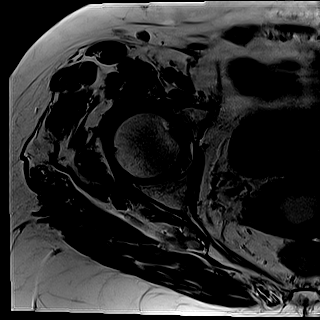
[im 34/40]
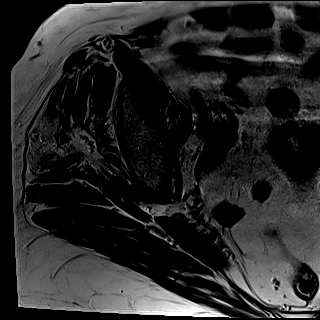
[im 40/40]
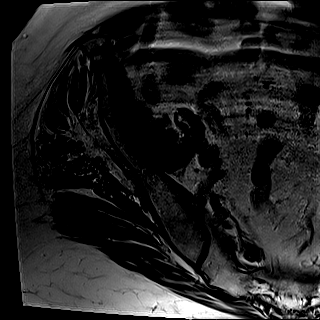

[Series 13: PD fat-sat · sagittal · right · 4.0mm · 0.56mm/px · 6 of 28 slices shown (2 of 2)]
[im 1/28]
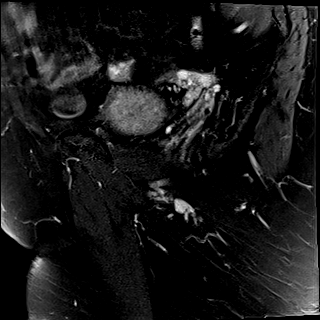
[im 6/28]
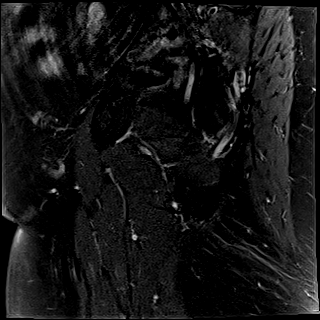
[im 11/28]
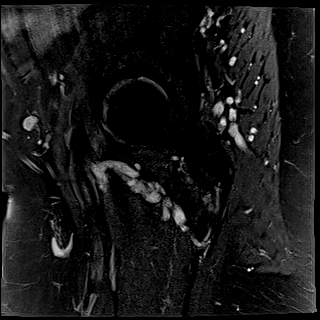
[im 17/28]
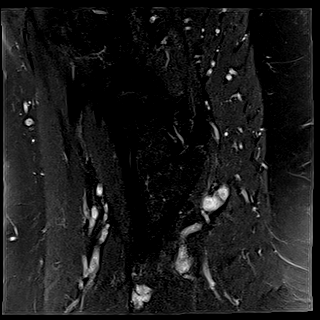
[im 22/28]
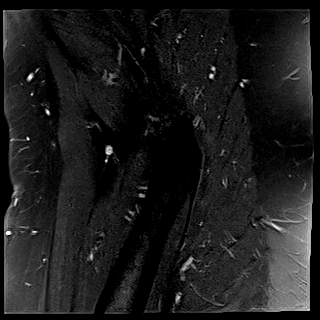
[im 28/28]
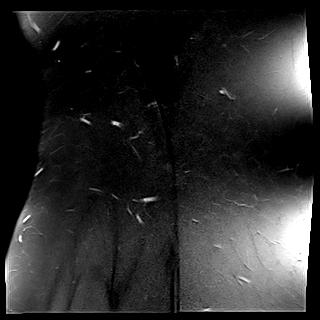

[39 of 40 positions shown; findings below may reference images not displayed]

FINDINGS: Bones: There is no evidence of acute fracture. No focal bone lesion.
The SI joints are unremarkable. The lower lumbar spine demonstrates
degenerative disc disease, partially visualized.

Articular cartilage and labrum

Articular cartilage:  Mild to moderate chondrosis.

Labrum:  Degenerative anterior superior labral tearing.

Joint or bursal effusion

Joint effusion:  Trace joint effusion.

Bursae: There is minimal left-sided trochanteric bursitis. No
right-sided trochanteric bursitis.

Muscles and tendons

Muscles and tendons: Bilateral gluteus minimus and medius muscle
atrophy, with intact appearing tendons. The adductors are intact.
The proximal hamstrings are intact with mild tendinosis on the
right.

Other findings

Miscellaneous: Large nabothian cyst. Otherwise the partially
visualized pelvic contents are unremarkable.
IMPRESSION: Mild to moderate osteoarthritis of the right hip, with degenerative
anterior superior labral tearing.

## 2021-01-27 NOTE — Therapy (Signed)
[facility] [address] [place], [place], [address] Phone: [phone]   Fax:  [phone]  Physical Therapy Treatment  Patient Details  Name: Brandi Villarreal MRN: [id number] Date of Birth: 1945/06/04 Referring Provider (PT): Dr. Leta Baptist   Encounter Date: 01/26/2021   PT End of Session - 01/26/21 1543     Visit Number 15    Number of Visits 17    Date for PT Re-Evaluation 03/03/21    Authorization Type U[employer] Medicare - will need 10th visit PN    PT Start Time 1535    PT Stop Time 1615    PT Time Calculation (min) 40 min    Equipment Utilized During Treatment Gait belt    Activity Tolerance Patient tolerated treatment well    Behavior During Therapy WFL for tasks assessed/performed             Past Medical History:  Diagnosis Date   Cholelithiasis    rare symptoms, declined surgery   Colitis ?1975   Hemorrhoids    Lichen sclerosus    sees gyn for management   Mitral valve prolapse    Osteoporosis    sees gyn   Right leg weakness    Varicose veins     Past Surgical History:  Procedure Laterality Date   jaw resection     RHINOPLASTY     TONSILLECTOMY     TUBAL LIGATION     bilteral    There were no vitals filed for this visit.   Subjective Assessment - 01/26/21 1542     Subjective No new complaitns. No falls. Hip is "manageable" today    Patient is accompained by: Family member   spouse   Pertinent History PMH: osteoporosis    Limitations Walking;Standing;House hold activities    How long can you stand comfortably? about 5 minutes    How long can you walk comfortably? approx. 56' with RW    Diagnostic tests MRI of the lumbar spine which showed mild to moderate spinal stenosis at L4-5 and some other degenerative changes. NCS on 11/26/20: Normal study.  No electrodiagnostic evidence of large fiber neuropathy at this time. MRI cervical spine showed multilevel degenerative changes.  MRI brain was  unremarkable.    Patient Stated Goals wants to get back to normal    Currently in Pain? No/denies   "just numbness and tingleing down leg"                    [facility] Adult PT Treatment/Exercise - 01/26/21 1543       Transfers   Transfers Stand to Sit;Sit to Stand    Sit to Stand 5: Supervision    Five time sit to stand comments  15 sec's no UE support from standard height surface    Stand to Sit 5: Supervision      Ambulation/Gait   Ambulation/Gait Yes    Ambulation/Gait Assistance 5: Supervision    Ambulation/Gait Assistance Details reminder cues on posture and to stay within the walker    Ambulation Distance (Feet) 330 Feet   x1, plus around clinic with session   Assistive device Rolling walker    Gait Pattern Step-through pattern;Decreased stance time - right;Decreased step length - left;Decreased hip/knee flexion - right;Decreased weight shift to right;Antalgic    Ambulation Surface Level;Indoor    Gait velocity 15.90= 2.06 ft/sec with RW    Stairs Yes    Stairs Assistance 5: Supervision    Stairs Assistance  Details (indicate cue type and reason) leading with right foot first to ascend with 1st set, then with left foot on 2cd set. reports increased pain when leading up with left foot vs right foot despite right LE being the LE with pain.    Stair Management Technique Two rails;Step to pattern;Forwards    Number of Stairs 4   x2   Height of Stairs 6                 Balance Exercises - 01/26/21 1603       Balance Exercises: Standing   Standing Eyes Closed Narrow base of support (BOS);Foam/compliant surface;Other reps (comment);30 secs;Limitations;Wide (BOA);Head turns    Standing Eyes Closed Limitations on airex with no UE support: narrow base of support with EC 30 sec's x 3 reps, then wider base of support for EC for head movements left<>right, up<>down for ~10 reps each. min guard to min assist for balance. cues on posture and weight shifting for balance  assist.                  PT Short Term Goals - 12/29/20 1630       PT SHORT TERM GOAL #1   Title Pt and pt's spouse will be independent with initial HEP in order to build upon functional gains made in therapy. ALL STGS DUE 12/31/20    Baseline 12/29/20: met with current program    Status Achieved      PT SHORT TERM GOAL #2   Title Pt will undergo TUG with RW and LTG written in order to demo decr fall risk.    Baseline 12/29/20: 21.31 sec's with RW.Primary PT to set goal.    Status Achieved      PT SHORT TERM GOAL #3   Title Pt will improve gait speed with RW to at least 1.0 ft/sec in order to demo improved gait efficiency/decr fall risk.    Baseline 12/29/20: 1.27 ft/sec with RW.    Status Achieved      PT SHORT TERM GOAL #4   Title Pt will ambulate at least 115' with RW and supervision over level indoor surfaces in order to demo improved household mobility.    Baseline 12/29/20: met in session today    Status Achieved               PT Long Term Goals - 01/26/21 2250       PT LONG TERM GOAL #1   Title Pt and pt's spouse will be independent with final HEP in order to build upon functional gains made in therapy. ALL LTGS DUE 01/28/21    Time 8    Period Weeks    Status On-going      PT LONG TERM GOAL #2   Title Pt will decr TUG with RW.vs. no AD in 18 seconds or less in order to demo decr fall risk.    Baseline 21.31    Time 8    Period Weeks    Status On-going      PT LONG TERM GOAL #3   Title Pt will perform 5x sit <> stand in 15 seconds or less in order to demo improved functional BLE strength.    Baseline 01/26/21: 15 sec's no UE support from standard height surface    Status Achieved      PT LONG TERM GOAL #4   Title Pt will improve gait speed with RW to at least 2.0 ft/sec in order to demo improved  gait efficiency.    Baseline 01/26/21: 2.06 ft/sec with Rw    Status Achieved      PT LONG TERM GOAL #5   Title Pt will ambulate at least 345' with  supervision and RW vs. LRAD in order to demo improved gait efficiency.    Baseline 01/26/21: 330 feet with RW at supervision level, improved from 50 feet, just not to goal level    Status Partially Met      PT LONG TERM GOAL #6   Title Pt will go up and down 8 steps with bilat vs. single handrail with step to pattern and supervision in order to safely go up the stairs in her house.    Baseline 01/26/21: met in session today    Status Achieved                   Plan - 01/26/21 1543     Clinical Impression Statement Today's skilled session began to check progress toward LTGs with pt meeting stair goal, gait speed goal and 5 time sit to stand goal. Pt has shown progress toward other goals despite hip/LE pain limitations. Will plan to check remaining goals at next session for recert vs discharge.    Personal Factors and Comorbidities Comorbidity 1;Time since onset of injury/illness/exacerbation;Past/Current Experience    Comorbidities osteoporosis    Examination-Activity Limitations Locomotion Level;Lift;Dressing;Hygiene/Grooming;Bathing;Reach Overhead;Transfers;Stand;Stairs    Examination-Participation Restrictions Community Activity;Driving;Cleaning;Laundry    Stability/Clinical Decision Making Evolving/Moderate complexity    Rehab Potential Good    PT Frequency 2x / week    PT Duration 12 weeks    PT Treatment/Interventions ADLs/Self Care Home Management;Aquatic Therapy;Gait training;DME Instruction;Stair training;Functional mobility training;Therapeutic activities;Neuromuscular re-education;Balance training;Therapeutic exercise;Patient/family education;Passive range of motion;Manual techniques;Energy conservation;Vestibular    PT Next Visit Plan check LTGs and figure out plan, thinking about D/C and having pt return when pain is more under control,    PT Home Exercise Plan [id number]    Consulted and Agree with Plan of Care Patient;Family member/caregiver    Family Member Consulted  pt's husband             Patient will benefit from skilled therapeutic intervention in order to improve the following deficits and impairments:  Abnormal gait, Decreased balance, Decreased activity tolerance, Decreased mobility, Difficulty walking, Decreased strength, Impaired sensation, Pain, Postural dysfunction  Visit Diagnosis: Unsteadiness on feet  Other abnormalities of gait and mobility  Difficulty in walking, not elsewhere classified  Muscle weakness (generalized)     Problem List Patient Active Problem List   Diagnosis Date Noted   Histoplasmosis 08/21/2019   Chronic left shoulder pain 08/03/2017   Aortic atherosclerosis ([place]) 07/18/2017   Cholelithiasis 03/20/2012   Osteoporosis 08/09/2006    Willow Ora, PTA, [facility] [address] [place], [place] [address] [phone] 01/27/21, 10:56 PM   Name: Brandi Villarreal MRN: [id number] Date of Birth: 1945/09/29

## 2021-01-29 ENCOUNTER — Ambulatory Visit: Payer: Medicare Other | Admitting: Physical Therapy

## 2021-01-29 ENCOUNTER — Other Ambulatory Visit: Payer: Self-pay

## 2021-01-29 ENCOUNTER — Encounter: Payer: Self-pay | Admitting: Physical Therapy

## 2021-01-29 DIAGNOSIS — M25552 Pain in left hip: Secondary | ICD-10-CM | POA: Diagnosis not present

## 2021-01-29 DIAGNOSIS — R262 Difficulty in walking, not elsewhere classified: Secondary | ICD-10-CM

## 2021-01-29 DIAGNOSIS — R2689 Other abnormalities of gait and mobility: Secondary | ICD-10-CM

## 2021-01-29 DIAGNOSIS — M6281 Muscle weakness (generalized): Secondary | ICD-10-CM | POA: Diagnosis not present

## 2021-01-29 DIAGNOSIS — R2681 Unsteadiness on feet: Secondary | ICD-10-CM

## 2021-01-29 DIAGNOSIS — M25551 Pain in right hip: Secondary | ICD-10-CM | POA: Diagnosis not present

## 2021-01-29 NOTE — Therapy (Signed)
[facility] [address] [place], [place], [address] Phone: [phone]   Fax:  [phone]  Physical Therapy Treatment  Patient Details  Name: Brandi Villarreal MRN: [id number] Date of Birth: June 06, 1945 Referring Provider (PT): Dr. Leta Baptist   Encounter Date: 01/29/2021   PT End of Session - 01/29/21 0856     Visit Number 16    Number of Visits 17    Date for PT Re-Evaluation 03/03/21    Authorization Type UHC Medicare - will need 10th visit PN    PT Start Time 0847    PT Stop Time 0927    PT Time Calculation (min) 40 min    Equipment Utilized During Treatment Gait belt    Activity Tolerance Patient tolerated treatment well    Behavior During Therapy WFL for tasks assessed/performed             Past Medical History:  Diagnosis Date   Cholelithiasis    rare symptoms, declined surgery   Colitis ?1975   Hemorrhoids    Lichen sclerosus    sees gyn for management   Mitral valve prolapse    Osteoporosis    sees gyn   Right leg weakness    Varicose veins     Past Surgical History:  Procedure Laterality Date   jaw resection     RHINOPLASTY     TONSILLECTOMY     TUBAL LIGATION     bilteral    There were no vitals filed for this visit.   Subjective Assessment - 01/29/21 0850     Subjective Got an MRI of her R hip on Wednesday. Made an appt with the orthopedic doctor on Monday. R hip not hurting today, just feeling tingling. Walking a little more with her RW. Has been using the foam roller and the tightness in her R quad has improved.    Patient is accompained by: Family member   spouse   Pertinent History PMH: osteoporosis    Limitations Walking;Standing;House hold activities    How long can you stand comfortably? about 5 minutes    How long can you walk comfortably? approx. 23' with RW    Diagnostic tests MRI of the lumbar spine which showed mild to moderate spinal stenosis at L4-5 and some other  degenerative changes. NCS on 11/26/20: Normal study.  No electrodiagnostic evidence of large fiber neuropathy at this time. MRI cervical spine showed multilevel degenerative changes.  MRI brain was unremarkable.    Patient Stated Goals wants to get back to normal - wants to get off the RW    Currently in Pain? No/denies                [facility] PT Assessment - 01/29/21 0857       Assessment   Medical Diagnosis R leg weakness    Referring Provider (PT) Dr. Leta Baptist    Onset Date/Surgical Date 10/24/20    Hand Dominance Right      Precautions   Precautions Fall      Prior Function   Level of Independence Independent      Strength   Right Hip Flexion 4+/5    Right Hip ABduction 3-/5   incr pain when performing   Left Hip ABduction 4/5    Right Knee Flexion 5/5    Right Knee Extension 4+/5    Right Ankle Dorsiflexion 4+/5      Standardized Balance Assessment   Standardized Balance Assessment Berg Balance Test  Berg Balance Test   Sit to Stand Able to stand without using hands and stabilize independently    Standing Unsupported Able to stand safely 2 minutes    Sitting with Back Unsupported but Feet Supported on Floor or Stool Able to sit safely and securely 2 minutes    Stand to Sit Sits safely with minimal use of hands    Transfers Able to transfer safely, minor use of hands    Standing Unsupported with Eyes Closed Able to stand 10 seconds safely    Standing Unsupported with Feet Together Able to place feet together independently and stand 1 minute safely    From Standing, Reach Forward with Outstretched Arm Can reach confidently >25 cm (10")    From Standing Position, Pick up Object from Floor Able to pick up shoe safely and easily    From Standing Position, Turn to Look Behind Over each Shoulder Looks behind from both sides and weight shifts well    Turn 360 Degrees Able to turn 360 degrees safely but slowly   5.78 to L, 6.94 to R - more unsteady going to L   Standing  Unsupported, Alternately Place Feet on Step/Stool Able to complete >2 steps/needs minimal assist    Standing Unsupported, One Foot in Front Able to plae foot ahead of the other independently and hold 30 seconds   with LLE posteriorly   Standing on One Leg Able to lift leg independently and hold 5-10 seconds   on LLE 7.5 seconds, 1 second RLE   Total Score 49    Berg comment: moderate fall risk      Timed Up and Go Test   Normal TUG (seconds) 16.41   with RW                                   PT Education - 01/29/21 0931     Education Details See therapeutic activity section. Progress towards goals, results of BERG    Person(s) Educated Patient;Spouse    Methods Explanation    Comprehension Verbalized understanding              PT Short Term Goals - 12/29/20 1630       PT SHORT TERM GOAL #1   Title Pt and pt's spouse will be independent with initial HEP in order to build upon functional gains made in therapy. ALL STGS DUE 12/31/20    Baseline 12/29/20: met with current program    Status Achieved      PT SHORT TERM GOAL #2   Title Pt will undergo TUG with RW and LTG written in order to demo decr fall risk.    Baseline 12/29/20: 21.31 sec's with RW.Primary PT to set goal.    Status Achieved      PT SHORT TERM GOAL #3   Title Pt will improve gait speed with RW to at least 1.0 ft/sec in order to demo improved gait efficiency/decr fall risk.    Baseline 12/29/20: 1.27 ft/sec with RW.    Status Achieved      PT SHORT TERM GOAL #4   Title Pt will ambulate at least 115' with RW and supervision over level indoor surfaces in order to demo improved household mobility.    Baseline 12/29/20: met in session today    Status Achieved               PT Long Term Goals -  01/26/21 2250       PT LONG TERM GOAL #1   Title Pt and pt's spouse will be independent with final HEP in order to build upon functional gains made in therapy. ALL LTGS DUE 01/28/21    Time  8    Period Weeks    Status On-going      PT LONG TERM GOAL #2   Title Pt will decr TUG with RW.vs. no AD in 18 seconds or less in order to demo decr fall risk.    Baseline 21.31    Time 8    Period Weeks    Status On-going      PT LONG TERM GOAL #3   Title Pt will perform 5x sit <> stand in 15 seconds or less in order to demo improved functional BLE strength.    Baseline 01/26/21: 15 sec's no UE support from standard height surface    Status Achieved      PT LONG TERM GOAL #4   Title Pt will improve gait speed with RW to at least 2.0 ft/sec in order to demo improved gait efficiency.    Baseline 01/26/21: 2.06 ft/sec with Rw    Status Achieved      PT LONG TERM GOAL #5   Title Pt will ambulate at least 345' with supervision and RW vs. LRAD in order to demo improved gait efficiency.    Baseline 01/26/21: 330 feet with RW at supervision level, improved from 50 feet, just not to goal level    Status Partially Met      PT LONG TERM GOAL #6   Title Pt will go up and down 8 steps with bilat vs. single handrail with step to pattern and supervision in order to safely go up the stairs in her house.    Baseline 01/26/21: met in session today    Status Achieved                    Patient will benefit from skilled therapeutic intervention in order to improve the following deficits and impairments:     Visit Diagnosis: Unsteadiness on feet  Other abnormalities of gait and mobility  Difficulty in walking, not elsewhere classified  Muscle weakness (generalized)  Pain in right hip     Problem List Patient Active Problem List   Diagnosis Date Noted   Histoplasmosis 08/21/2019   Chronic left shoulder pain 08/03/2017   Aortic atherosclerosis ([place]) 07/18/2017   Cholelithiasis 03/20/2012   Osteoporosis 08/09/2006    Arliss Journey, PT, DPT  01/29/2021, 12:19 PM  [facility] [address] [place], [place], [address] Phone: [phone]   Fax:  [phone]  Name: Brandi Villarreal MRN: [id number] Date of Birth: 14-Jun-1945

## 2021-02-01 DIAGNOSIS — M25551 Pain in right hip: Secondary | ICD-10-CM | POA: Diagnosis not present

## 2021-02-01 DIAGNOSIS — M1611 Unilateral primary osteoarthritis, right hip: Secondary | ICD-10-CM | POA: Diagnosis not present

## 2021-04-22 DIAGNOSIS — M4316 Spondylolisthesis, lumbar region: Secondary | ICD-10-CM | POA: Diagnosis not present

## 2021-04-22 DIAGNOSIS — M5416 Radiculopathy, lumbar region: Secondary | ICD-10-CM | POA: Diagnosis not present

## 2021-04-23 ENCOUNTER — Other Ambulatory Visit: Payer: Self-pay | Admitting: Neurological Surgery

## 2021-04-23 DIAGNOSIS — R292 Abnormal reflex: Secondary | ICD-10-CM

## 2021-05-10 ENCOUNTER — Ambulatory Visit
Admission: RE | Admit: 2021-05-10 | Discharge: 2021-05-10 | Disposition: A | Discharge status: Home / Self Care | Payer: Medicare Other | Source: Ambulatory Visit | Attending: Neurological Surgery | Admitting: Neurological Surgery

## 2021-05-10 DIAGNOSIS — R292 Abnormal reflex: Secondary | ICD-10-CM

## 2021-05-10 DIAGNOSIS — M2578 Osteophyte, vertebrae: Secondary | ICD-10-CM | POA: Diagnosis not present

## 2021-05-10 IMAGING — MR MR THORACIC SPINE W/O CM
4 of 6 series · 20 of 48 positions shown · non-contrast
Comparison: No direct comparison study available. Two-view chest
radiograph [DATE]

CLINICAL DATA: Difficulty walking for 6 weeks, right leg and foot
weakness

EXAM:
MRI THORACIC SPINE WITHOUT CONTRAST
TECHNIQUE: Multiplanar, multisequence MR imaging of the thoracic spine was
performed. No intravenous contrast was administered.

[Series 17: T1 · sagittal · 3.0mm · 0.83mm/px · 3 of 15 slices shown]
[im 3/15]
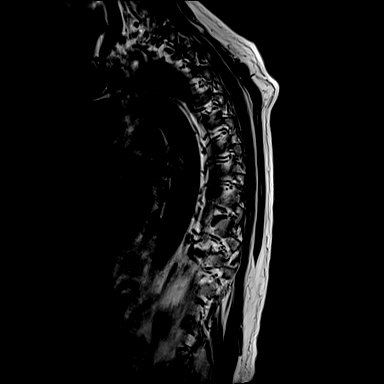
[im 9/15]
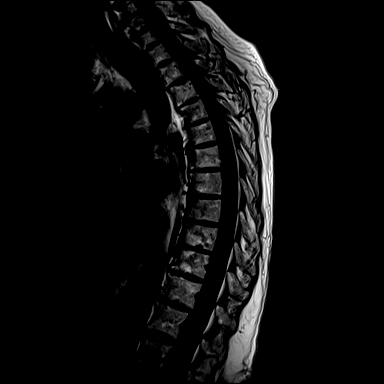
[im 15/15]
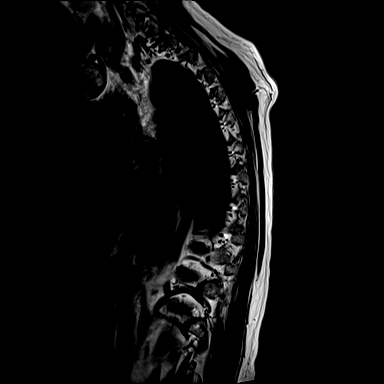

[Series 18: STIR · sagittal · 3.0mm · 1.00mm/px · 3 of 15 slices shown]
[im 3/15]
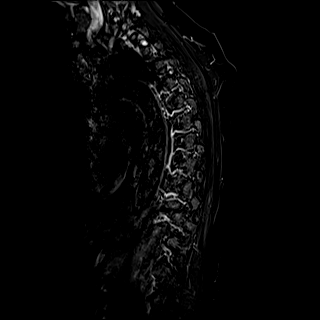
[im 9/15]
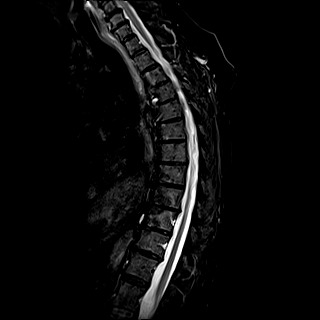
[im 15/15]
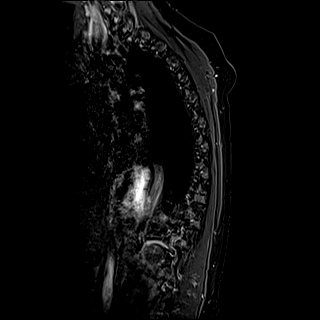

[Series 19: T2 · sagittal · 3.0mm · 0.83mm/px · 6 of 15 slices shown (1 of 2)]
[im 1/15]
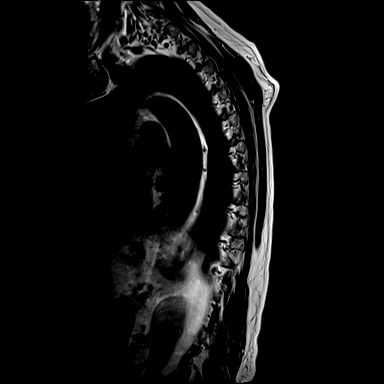
[im 3/15]
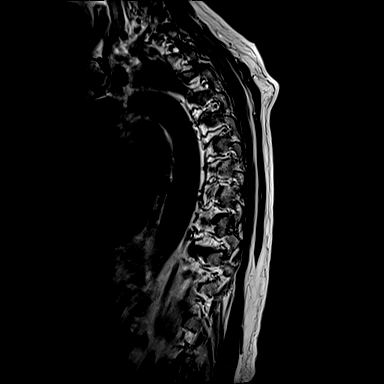
[im 6/15]
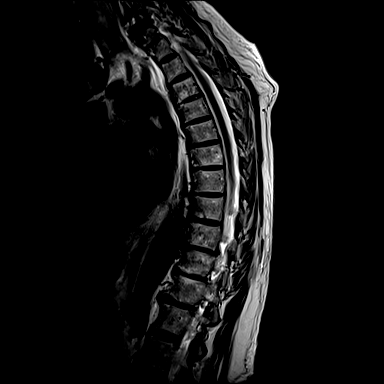
[im 9/15]
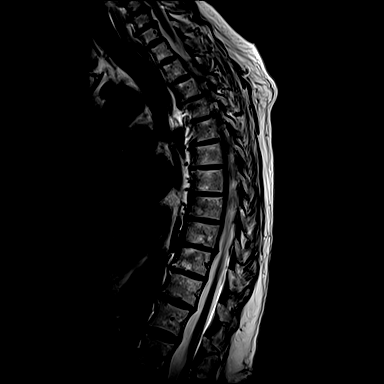
[im 12/15]
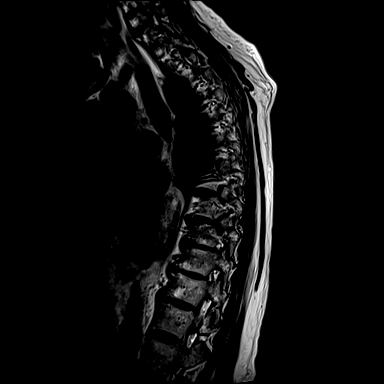
[im 15/15]
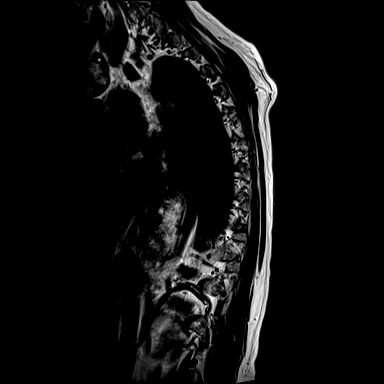

[Series 20: T2 · axial · 4.0mm · 0.28mm/px · z∈[-191,-70]mm · 8 of 33 slices shown (2 of 2)]
[im 1/33]
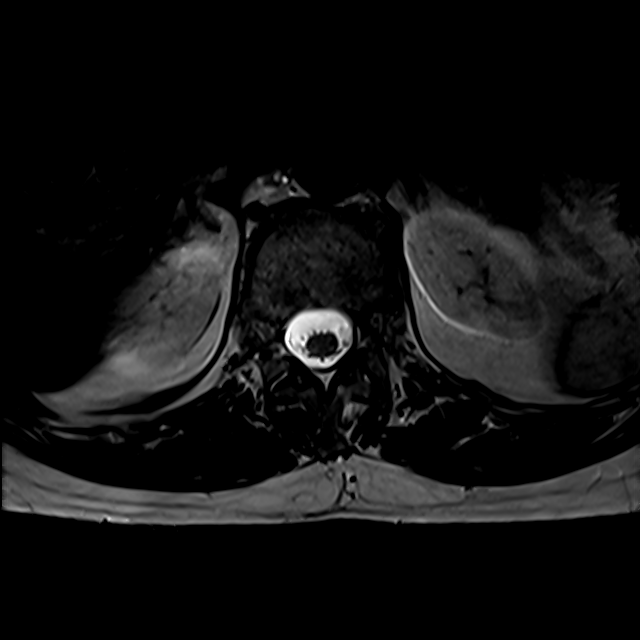
[im 6/33]
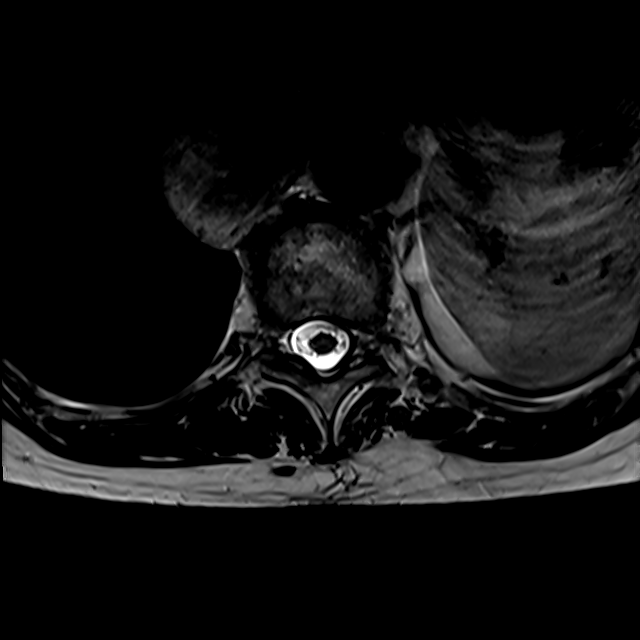
[im 11/33]
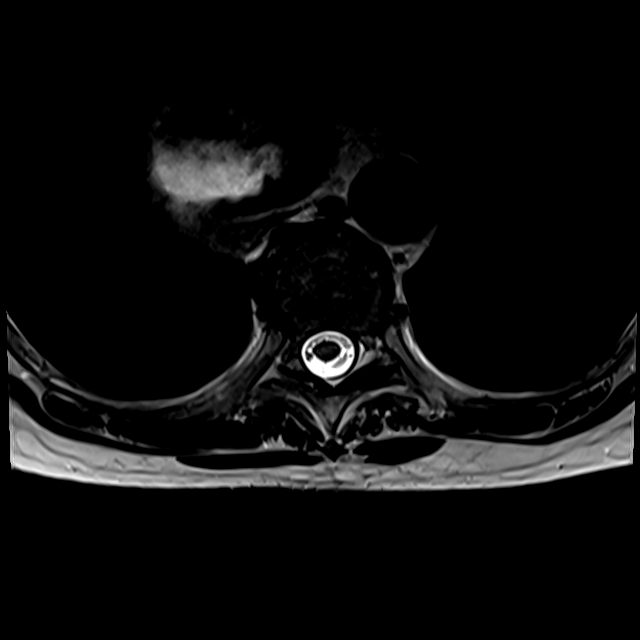
[im 14/33]
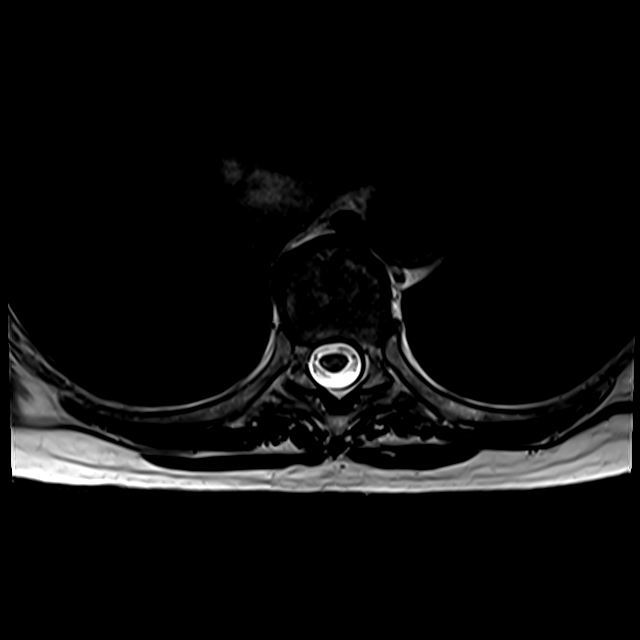
[im 17/33]
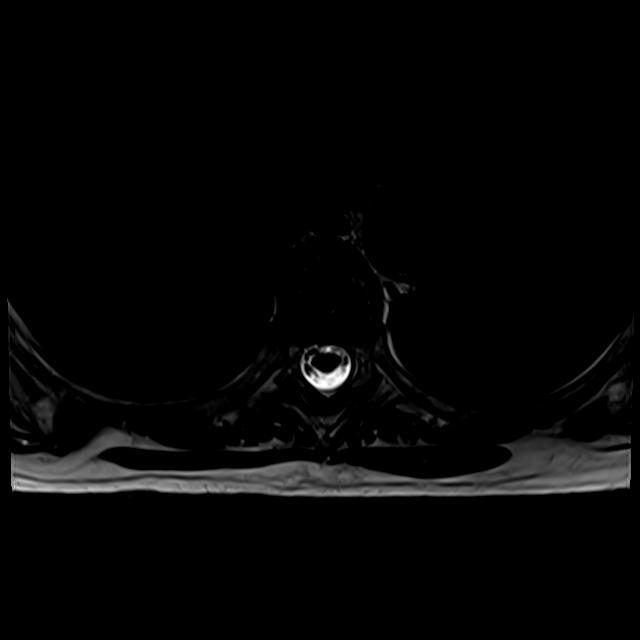
[im 19/33]
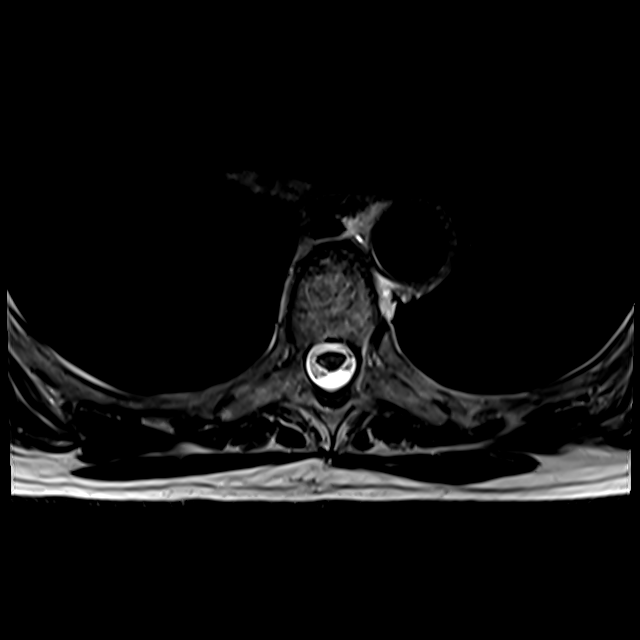
[im 22/33]
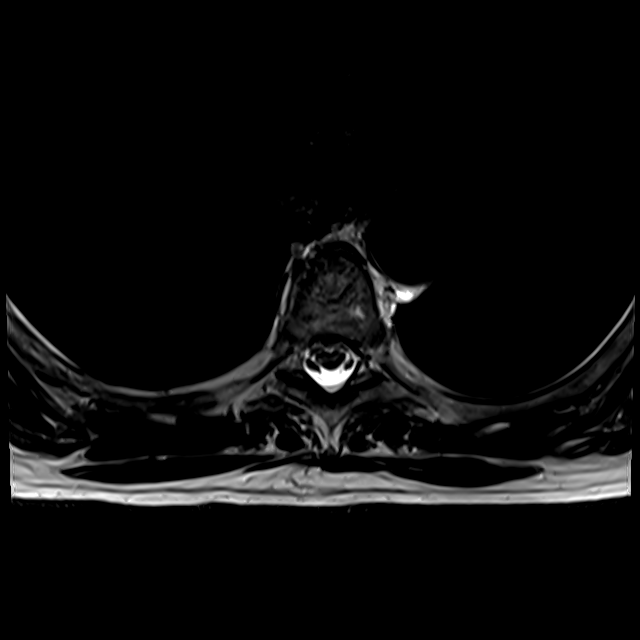
[im 27/33]
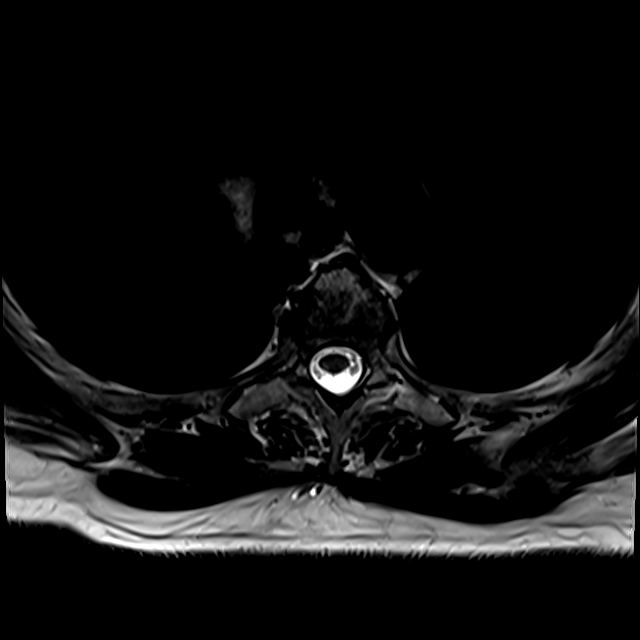

[20 of 48 positions shown; findings below may reference images not displayed]

FINDINGS: Alignment: There is trace anterolisthesis of C7 on T1, T1 on T2, and
T2 on T3. Alignment is otherwise normal.

Vertebrae: Vertebral body heights are preserved. Foci of T1 and T2
hyperintensity in the T10 and T11 vertebral bodies likely reflect
small intraosseous hemangiomas. There is degenerative endplate
marrow signal abnormality at T9-T10. Marrow signal is otherwise
somewhat heterogeneous throughout the thoracic spine, likely
degenerative in nature. There is no focal or suspicious marrow
signal abnormality. There is no marrow edema. There is a Schmorl's
node indenting the inferior T9 endplate.

There are prominent endplate osteophytes in the lower cervical
spine. There is mild degenerative endplate change throughout the
thoracic spine.

Cord:  Normal in signal and morphology.

Paraspinal and other soft tissues: There is a large hiatal hernia.
The paraspinal soft tissues are unremarkable.

Disc levels:

There are small disc protrusions at T5-T6, T6-T7, T7-T8, T9-T10, and
T10-T11 resulting in up to mild spinal canal stenosis at T7-T8.
There is no high-grade spinal canal stenosis or mass effect on the
cord. There is no significant neural foraminal stenosis in the
thoracic spine.
IMPRESSION: 1. Overall mild multilevel degenerative changes with scattered small
disc protrusions throughout the thoracic spine as detailed above
without significant spinal canal or neural foraminal stenosis. No
evidence of cord compression or nerve root impingement.
2. No acute finding.
3. Large hiatal hernia.

## 2021-05-12 ENCOUNTER — Ambulatory Visit: Payer: Medicare Other | Attending: Diagnostic Neuroimaging | Admitting: Physical Therapy

## 2021-05-12 ENCOUNTER — Other Ambulatory Visit: Payer: Self-pay

## 2021-05-12 ENCOUNTER — Encounter: Payer: Self-pay | Admitting: Physical Therapy

## 2021-05-12 DIAGNOSIS — R2681 Unsteadiness on feet: Secondary | ICD-10-CM | POA: Diagnosis not present

## 2021-05-12 DIAGNOSIS — M6281 Muscle weakness (generalized): Secondary | ICD-10-CM | POA: Insufficient documentation

## 2021-05-12 DIAGNOSIS — R2689 Other abnormalities of gait and mobility: Secondary | ICD-10-CM | POA: Insufficient documentation

## 2021-05-12 DIAGNOSIS — R262 Difficulty in walking, not elsewhere classified: Secondary | ICD-10-CM | POA: Insufficient documentation

## 2021-05-12 DIAGNOSIS — M79605 Pain in left leg: Secondary | ICD-10-CM | POA: Diagnosis not present

## 2021-05-13 NOTE — Therapy (Signed)
[facility] [address] [place], [place], [address] Phone: [phone]   Fax:  [phone]  Physical Therapy Evaluation  Patient Details  Name: Brandi Villarreal MRN: [id number] Date of Birth: [birth year] Referring Provider (PT): Eustace Moore, MD   Encounter Date: 05/12/2021   PT End of Session - 05/13/21 1311     Visit Number 1    Number of Visits 13    Date for PT Re-Evaluation 08/11/21    Authorization Type [employer] Medicare    PT Start Time 1450    PT Stop Time 1530    PT Time Calculation (min) 40 min    Equipment Utilized During Treatment Gait belt    Activity Tolerance Patient tolerated treatment well;Patient limited by pain    Behavior During Therapy WFL for tasks assessed/performed             Past Medical History:  Diagnosis Date   Cholelithiasis    rare symptoms, declined surgery   Colitis ?1975   Hemorrhoids    Lichen sclerosus    sees gyn for management   Mitral valve prolapse    Osteoporosis    sees gyn   Right leg weakness    Varicose veins     Past Surgical History:  Procedure Laterality Date   jaw resection     RHINOPLASTY     TONSILLECTOMY     TUBAL LIGATION     bilteral    There were no vitals filed for this visit.    Subjective Assessment - 05/12/21 1455     Subjective R toe is really hurting. Since she was last here got an MRI of her R hip which showed mild/moderate arthritis. Had an injection which helped a little bit. Sees Dr. Ronnald Ramp now the neurosurgeon. Had recent thoracic spine - No  evidence of cord compression or nerve root impingement. Will go in a couple weeks to get an injection in her back (on the 22nd of this month). Uses the rollator. Rarely sits in the wheelchair. No falls. Legs don't feel like they are going to give out.    Patient is accompained by: Family member   spouse   Pertinent History PMH: osteoporosis    Limitations Walking;Standing;House hold activities     How long can you stand comfortably? --    How long can you walk comfortably? with rollator about 10 minutes.    Diagnostic tests MRI of the lumbar spine which showed mild to moderate spinal stenosis at L4-5 and some other degenerative changes. NCS on 11/26/20: Normal study.  No electrodiagnostic evidence of large fiber neuropathy at this time. MRI cervical spine showed multilevel degenerative changes.  MRI brain was unremarkable. 01/29/21 MRI of R hip; mild/moderate arthritis.    Patient Stated Goals walk with a cane and then no AD, improve her balance.    Currently in Pain? Yes    Pain Location Leg   R leg down into R toe   Pain Orientation Right    Pain Descriptors / Indicators --   foot feels like its throbbin   Pain Type Chronic pain    Aggravating Factors  Standing.    Pain Relieving Factors Putting pressure on it.                [facility] PT Assessment - 05/12/21 1507       Assessment   Medical Diagnosis Radiculopathy    Referring Provider (PT) Eustace Moore, MD    Onset  Date/Surgical Date 04/08/21    Hand Dominance Right    Prior Therapy Previous OPPT at this location last fall.      Precautions   Precautions Fall      Balance Screen   Has the patient fallen in the past 6 months No    Has the patient had a decrease in activity level because of a fear of falling?  No    Is the patient reluctant to leave their home because of a fear of falling?  No      Home Environment   Living Environment Private residence    Living Arrangements Spouse/significant other    Type of [place] to enter    Entrance Stairs-Number of Steps 2   in front, 5 or 6 in back   Entrance Stairs-Rails None;Right   in front, railing in back and there is a wall   Home Layout Two level    Alternate Level Stairs-Number of Steps 12    Alternate Level Stairs-Rails Right    Home Equipment Wheelchair - manual;Walker - standard;Other (comment);Walker - 4 wheels;Cane - single point     Additional Comments Still living down on main floor, only goes upstairs 1x a week to take a bath.      Prior Function   Level of Independence Independent    Vocation Retired    Leisure Has a beach house in [place]      Sensation   Light Touch Impaired Detail    Additional Comments R foot is throbbing, tingling down R side      Coordination   Gross Motor Movements are Fluid and Coordinated No    Heel Shin Test slightly slower wth RLE      ROM / Strength   AROM / PROM / Strength Strength;AROM      AROM   Overall AROM Comments Bilat hip ROM WNL with exception to R IR, pt reports incr tightness    AROM Assessment Site Lumbar    Lumbar Flexion can touch floor    Lumbar Extension WFL    Lumbar - Right Side Bend WFL    Lumbar - Left Side Bend [facility]      Strength   Right/Left Hip Right;Left    Right Hip Flexion 4/5    Right Hip Extension 3+/5    Right Hip ABduction 3-/5   incr pain when performing   Left Hip Flexion 5/5    Left Hip Extension 3+/5    Left Hip ABduction 4/5   incr pain in hip joint   Right Knee Flexion 4+/5    Right Knee Extension 4-/5    Left Knee Flexion 5/5    Left Knee Extension 5/5    Right Ankle Dorsiflexion 4+/5    Left Ankle Dorsiflexion 5/5      Transfers   Transfers Stand to Sit;Sit to Stand    Sit to Stand 5: Supervision    Five time sit to stand comments  11 seconds with no UE support    Stand to Sit 5: Supervision      Ambulation/Gait   Ambulation/Gait Yes    Ambulation/Gait Assistance 5: Supervision    Assistive device Rollator    Gait Pattern Step-through pattern;Decreased stance time - right;Decreased step length - left;Decreased hip/knee flexion - right;Decreased weight shift to right;Antalgic    Ambulation Surface Level;Indoor    Gait velocity 19.38 seconds = 1.69 ft/sec      Timed Up and  Go Test   TUG Normal TUG    Normal TUG (seconds) 15.81   with rollator   TUG Comments 35.63 seconds with no AD with min A for safety, pt  reporting incr electrical shocks down RLE                        Objective measurements completed on examination: See above findings.                  PT Short Term Goals - 05/13/21 1312       PT SHORT TERM GOAL #1   Title Pt and pt's spouse will be independent with initial HEP in order to build upon functional gains made in therapy. ALL STGS DUE 06/10/21    Time 4    Period Weeks    Status New    Target Date 06/10/21      PT SHORT TERM GOAL #2   Title Pt will improve gait speed with rollator to at least 1.9 ft/sec in order to demo decr fall risk.    Baseline 1.69 ft/sec    Time 4    Period Weeks    Status New      PT SHORT TERM GOAL #3   Title Pt will perform TUG with SPC vs. no AD in 30 seconds or less in order to demo decr fall risk.    Baseline 35.63 seconds with no AD, min A    Time 4    Period Weeks    Status New      PT SHORT TERM GOAL #4   Title Pt will go up and down 12 steps with single handrail with step to vs step through pattern with supervision in order to demo improved functional mobility at home.    Time 4    Period Weeks    Status New               PT Long Term Goals - 05/13/21 1315       PT LONG TERM GOAL #1   Title Pt and pt's spouse will be independent with final HEP in order to build upon functional gains made in therapy. ALL LTGS DUE 07/08/21    Time 8    Period Weeks    Status New    Target Date 07/08/21      PT LONG TERM GOAL #2   Title Pt will perform TUG with SPC vs. no AD in 24 seconds or less in order to demo decr fall risk.    Baseline 35.63 seconds with no AD, min A    Time 8    Period Weeks    Status New      PT LONG TERM GOAL #3   Title Pt will ambulate with SPC 230' with supervision over level indoor surfaces in order to demo improved household mobility.    Time 8    Period Weeks    Status New      PT LONG TERM GOAL #4   Title Pt will improve gait speed with rollator vs. LRAD to at least 2.4  ft/sec in order to demo decr fall risk.    Baseline 1.69 ft/sec    Time 8    Period Weeks    Status New                    Plan - 05/13/21 1323     Clinical Impression Statement Patient is a  76 year old female referred to Neuro OPPT for lumbar radiculopathy.  Pt was seen at this clinic last fall for leg weakness and is currently being followed by Dr. Ronnald Ramp and will be getting injections to her back in the next couple of weeks.  Pt's PMH is significant for: osteoporosis.  The following deficits were present during the exam: impaired sensation, decr strength, pain down RLE, gait abnormalities, impaired activity tolerance, balance impairments. Based on TUG with no AD and gait speed with rollator pt is an incr risk for falls. Pt with incr pain down RLE, that affects her functional mobility and gait. Pt would benefit from skilled PT to address these impairments and functional limitations to maximize functional mobility independence and decr fall risk.    Personal Factors and Comorbidities Comorbidity 1;Time since onset of injury/illness/exacerbation;Past/Current Experience    Comorbidities osteoporosis, radiculopathy    Examination-Activity Limitations Locomotion Level;Lift;Hygiene/Grooming;Reach Overhead;Transfers;Stand;Stairs;Bathing    Examination-Participation Restrictions Community Activity;Driving;Cleaning;Laundry    Stability/Clinical Decision Making Evolving/Moderate complexity    Clinical Decision Making Moderate    Rehab Potential Good    PT Frequency 2x / week    PT Duration 12 weeks    PT Treatment/Interventions ADLs/Self Care Home Management;Aquatic Therapy;Gait training;DME Instruction;Stair training;Functional mobility training;Therapeutic activities;Neuromuscular re-education;Balance training;Therapeutic exercise;Patient/family education;Passive range of motion;Manual techniques;Energy conservation;Vestibular    PT Next Visit Plan update HEP for hip/core strengthening.  Balance tasks. Gait training with SPC.    PT Home Exercise Plan [email or web address]    Consulted and Agree with Plan of Care Patient;Family member/caregiver    Family Member Consulted pt's husband             Patient will benefit from skilled therapeutic intervention in order to improve the following deficits and impairments:  Abnormal gait, Decreased balance, Decreased activity tolerance, Decreased mobility, Difficulty walking, Decreased strength, Impaired sensation, Pain, Postural dysfunction, Decreased coordination, Decreased endurance, Increased muscle spasms, Impaired flexibility  Visit Diagnosis: Unsteadiness on feet  Other abnormalities of gait and mobility  Difficulty in walking, not elsewhere classified  Muscle weakness (generalized)     Problem List Patient Active Problem List   Diagnosis Date Noted   Histoplasmosis 08/21/2019   Chronic left shoulder pain 08/03/2017   Aortic atherosclerosis ([place]) 07/18/2017   Cholelithiasis 03/20/2012   Osteoporosis 08/09/2006    Arliss Journey, PT, DPT  05/13/2021, 1:27 PM  [facility] [address] [place], [place], [address] Phone: [phone]   Fax:  [phone]  Name: Brandi Villarreal MRN: [id number] Date of Birth: 12-Feb-1946

## 2021-05-18 ENCOUNTER — Encounter: Payer: Self-pay | Admitting: Physical Therapy

## 2021-05-18 ENCOUNTER — Ambulatory Visit: Payer: Medicare Other | Admitting: Physical Therapy

## 2021-05-18 ENCOUNTER — Other Ambulatory Visit: Payer: Self-pay

## 2021-05-18 DIAGNOSIS — R2681 Unsteadiness on feet: Secondary | ICD-10-CM | POA: Diagnosis not present

## 2021-05-18 DIAGNOSIS — R2689 Other abnormalities of gait and mobility: Secondary | ICD-10-CM

## 2021-05-18 DIAGNOSIS — M6281 Muscle weakness (generalized): Secondary | ICD-10-CM

## 2021-05-18 DIAGNOSIS — R262 Difficulty in walking, not elsewhere classified: Secondary | ICD-10-CM | POA: Diagnosis not present

## 2021-05-18 DIAGNOSIS — M79605 Pain in left leg: Secondary | ICD-10-CM | POA: Diagnosis not present

## 2021-05-18 NOTE — Patient Instructions (Signed)
Access Code: [email or web address] URL: [email or web address] Date: 05/18/2021 Prepared by: Janann August  Exercises Hooklying Single Knee to Chest - 2 x daily - 7 x weekly - 1 sets - 3 reps - 30s hold Supine Bridge - 2 x daily - 7 x weekly - 2 sets - 10 reps Seated Hamstring Stretch - 1 x daily - 5 x weekly - 3 sets - 20-30 hold Clamshell with Resistance - 1 x daily - 5 x weekly - 1-2 sets - 10 reps Supine Figure 4 Piriformis Stretch - 1 x daily - 5 x weekly - 3 sets - 30 hold Standing Toe Taps - 1 x daily - 5 x weekly - 1-2 sets - 10 reps Forward Step Up - 1-2 x daily - 5 x weekly - 2 sets - 5 reps

## 2021-05-18 NOTE — Therapy (Signed)
[facility] [address] [place], [place], [address] Phone: [phone]   Fax:  [phone]  Physical Therapy Treatment  Patient Details  Name: MAIYAH GOYNE MRN: [id number] Date of Birth: [birth year] Referring Provider (PT): Eustace Moore, MD   Encounter Date: 05/18/2021   PT End of Session - 05/18/21 1457     Visit Number 2    Number of Visits 13    Date for PT Re-Evaluation 08/11/21    Authorization Type UHC Medicare    PT Start Time 1451    PT Stop Time 1530    PT Time Calculation (min) 39 min    Equipment Utilized During Treatment Gait belt    Activity Tolerance Patient tolerated treatment well    Behavior During Therapy WFL for tasks assessed/performed             Past Medical History:  Diagnosis Date   Cholelithiasis    rare symptoms, declined surgery   Colitis ?1975   Hemorrhoids    Lichen sclerosus    sees gyn for management   Mitral valve prolapse    Osteoporosis    sees gyn   Right leg weakness    Varicose veins     Past Surgical History:  Procedure Laterality Date   jaw resection     RHINOPLASTY     TONSILLECTOMY     TUBAL LIGATION     bilteral    There were no vitals filed for this visit.   Subjective Assessment - 05/18/21 1454     Subjective Nothing new since last time. Today is an ok day. Gets her back injections next week.    Patient is accompained by: Family member   spouse   Pertinent History PMH: osteoporosis    Limitations Walking;Standing;House hold activities    How long can you walk comfortably? with rollator about 10 minutes.    Diagnostic tests MRI of the lumbar spine which showed mild to moderate spinal stenosis at L4-5 and some other degenerative changes. NCS on 11/26/20: Normal study.  No electrodiagnostic evidence of large fiber neuropathy at this time. MRI cervical spine showed multilevel degenerative changes.  MRI brain was unremarkable. 01/29/21 MRI of R hip;  mild/moderate arthritis.    Patient Stated Goals walk with a cane and then no AD, improve her balance.    Currently in Pain? Yes    Pain Score 5     Pain Location Back   back and right leg   Pain Orientation Right    Pain Descriptors / Indicators --   back feels like its throbbing and aching. R foot feels numb.   Pain Type Chronic pain                           Access Code: [email or web address] URL: [email or web address] Date: 05/18/2021 Prepared by: Janann August  Reviewed and updated pt's HEP from previous bout of therapy.   Exercises Hooklying Single Knee to Chest - 2 x daily - 7 x weekly - 1 sets - 3 reps - 30s hold - cues for 30 second hold and stretching to pt's tolerance.  Supine Bridge - 2 x daily - 7 x weekly - 2 sets - 10 reps - with red tband around thighs for isometric hip ABD activation first.  Seated Hamstring Stretch - 1 x daily - 5 x weekly - 3 sets - 20-30 hold Clamshell with Resistance - 1 x daily - 5  x weekly - 1-2 sets - 10 reps - with use of yellow tband  Supine Figure 4 Piriformis Stretch - 1 x daily - 5 x weekly - 3 sets - 30 hold Standing Toe Taps - 1 x daily - 5 x weekly - 1-2 sets - 10 reps -at bottom of stair case with fingertip support Forward Step Up - 1-2 x daily - 5 x weekly - 2 sets - 5 reps - bottom of staircase with BUE support              PT Education - 05/18/21 1629     Education Details Reviewed and updated previous HEP. Scheduling additional appts.    Person(s) Educated Patient;Spouse    Methods Explanation;Demonstration;Handout    Comprehension Verbalized understanding;Returned demonstration              PT Short Term Goals - 05/13/21 1312       PT SHORT TERM GOAL #1   Title Pt and pt's spouse will be independent with initial HEP in order to build upon functional gains made in therapy. ALL STGS DUE 06/10/21    Time 4    Period Weeks    Status New    Target Date 06/10/21      PT SHORT TERM GOAL #2    Title Pt will improve gait speed with rollator to at least 1.9 ft/sec in order to demo decr fall risk.    Baseline 1.69 ft/sec    Time 4    Period Weeks    Status New      PT SHORT TERM GOAL #3   Title Pt will perform TUG with SPC vs. no AD in 30 seconds or less in order to demo decr fall risk.    Baseline 35.63 seconds with no AD, min A    Time 4    Period Weeks    Status New      PT SHORT TERM GOAL #4   Title Pt will go up and down 12 steps with single handrail with step to vs step through pattern with supervision in order to demo improved functional mobility at home.    Time 4    Period Weeks    Status New               PT Long Term Goals - 05/13/21 1315       PT LONG TERM GOAL #1   Title Pt and pt's spouse will be independent with final HEP in order to build upon functional gains made in therapy. ALL LTGS DUE 07/08/21    Time 8    Period Weeks    Status New    Target Date 07/08/21      PT LONG TERM GOAL #2   Title Pt will perform TUG with SPC vs. no AD in 24 seconds or less in order to demo decr fall risk.    Baseline 35.63 seconds with no AD, min A    Time 8    Period Weeks    Status New      PT LONG TERM GOAL #3   Title Pt will ambulate with SPC 230' with supervision over level indoor surfaces in order to demo improved household mobility.    Time 8    Period Weeks    Status New      PT LONG TERM GOAL #4   Title Pt will improve gait speed with rollator vs. LRAD to at least 2.4 ft/sec in order to  demo decr fall risk.    Baseline 1.69 ft/sec    Time 8    Period Weeks    Status New                   Plan - 05/18/21 1639     Clinical Impression Statement Today's skilled session focused on reviewing previous HEP from therapy and updating as appropriate for balance, stretching, and hip strengthening. Pt tolerated session well with no reports of incr pain. Will continue to progress towards LTGs.    Personal Factors and Comorbidities Comorbidity  1;Time since onset of injury/illness/exacerbation;Past/Current Experience    Comorbidities osteoporosis, radiculopathy    Examination-Activity Limitations Locomotion Level;Lift;Hygiene/Grooming;Reach Overhead;Transfers;Stand;Stairs;Bathing    Examination-Participation Restrictions Community Activity;Driving;Cleaning;Laundry    Stability/Clinical Decision Making Evolving/Moderate complexity    Rehab Potential Good    PT Frequency 2x / week    PT Duration 12 weeks    PT Treatment/Interventions ADLs/Self Care Home Management;Aquatic Therapy;Gait training;DME Instruction;Stair training;Functional mobility training;Therapeutic activities;Neuromuscular re-education;Balance training;Therapeutic exercise;Patient/family education;Passive range of motion;Manual techniques;Energy conservation;Vestibular    PT Next Visit Plan how is HEP for hip/core strengthening. Balance tasks. Gait training with SPC.    PT Home Exercise Plan [email or web address]    Consulted and Agree with Plan of Care Patient;Family member/caregiver    Family Member Consulted pt's husband             Patient will benefit from skilled therapeutic intervention in order to improve the following deficits and impairments:  Abnormal gait, Decreased balance, Decreased activity tolerance, Decreased mobility, Difficulty walking, Decreased strength, Impaired sensation, Pain, Postural dysfunction, Decreased coordination, Decreased endurance, Increased muscle spasms, Impaired flexibility  Visit Diagnosis: Unsteadiness on feet  Other abnormalities of gait and mobility  Difficulty in walking, not elsewhere classified  Muscle weakness (generalized)     Problem List Patient Active Problem List   Diagnosis Date Noted   Histoplasmosis 08/21/2019   Chronic left shoulder pain 08/03/2017   Aortic atherosclerosis ([place]) 07/18/2017   Cholelithiasis 03/20/2012   Osteoporosis 08/09/2006    Arliss Journey, PT, DPT  05/18/2021, 4:40 PM  [facility] [address] [place], [place], [address] Phone: [phone]   Fax:  [phone]  Name: MAYLA BIDDY MRN: [id number] Date of Birth: 1945-10-07

## 2021-05-25 DIAGNOSIS — M5416 Radiculopathy, lumbar region: Secondary | ICD-10-CM | POA: Diagnosis not present

## 2021-05-26 DIAGNOSIS — Z1231 Encounter for screening mammogram for malignant neoplasm of breast: Secondary | ICD-10-CM | POA: Diagnosis not present

## 2021-05-26 DIAGNOSIS — R29890 Loss of height: Secondary | ICD-10-CM | POA: Diagnosis not present

## 2021-05-27 ENCOUNTER — Encounter: Payer: Self-pay | Admitting: Physical Therapy

## 2021-05-27 ENCOUNTER — Ambulatory Visit: Payer: Medicare Other | Admitting: Physical Therapy

## 2021-05-27 ENCOUNTER — Other Ambulatory Visit: Payer: Self-pay

## 2021-05-27 DIAGNOSIS — R2689 Other abnormalities of gait and mobility: Secondary | ICD-10-CM | POA: Diagnosis not present

## 2021-05-27 DIAGNOSIS — M6281 Muscle weakness (generalized): Secondary | ICD-10-CM | POA: Diagnosis not present

## 2021-05-27 DIAGNOSIS — R2681 Unsteadiness on feet: Secondary | ICD-10-CM | POA: Diagnosis not present

## 2021-05-27 DIAGNOSIS — M79605 Pain in left leg: Secondary | ICD-10-CM | POA: Diagnosis not present

## 2021-05-27 DIAGNOSIS — R262 Difficulty in walking, not elsewhere classified: Secondary | ICD-10-CM | POA: Diagnosis not present

## 2021-05-27 NOTE — Therapy (Signed)
[facility] [address] [place], [place], [address] Phone: [phone]   Fax:  [phone]  Physical Therapy Treatment  Patient Details  Name: Brandi Villarreal MRN: [id number] Date of Birth: 11-08-1945 Referring Provider (PT): Eustace Moore, MD   Encounter Date: 05/27/2021   PT End of Session - 05/27/21 0936     Visit Number 3    Number of Visits 13    Date for PT Re-Evaluation 08/11/21    Authorization Type [employer] Medicare    PT Start Time 0933    PT Stop Time 1013    PT Time Calculation (min) 40 min    Equipment Utilized During Treatment Gait belt    Activity Tolerance Patient tolerated treatment well    Behavior During Therapy WFL for tasks assessed/performed             Past Medical History:  Diagnosis Date   Cholelithiasis    rare symptoms, declined surgery   Colitis ?1975   Hemorrhoids    Lichen sclerosus    sees gyn for management   Mitral valve prolapse    Osteoporosis    sees gyn   Right leg weakness    Varicose veins     Past Surgical History:  Procedure Laterality Date   jaw resection     RHINOPLASTY     TONSILLECTOMY     TUBAL LIGATION     bilteral    There were no vitals filed for this visit.   Subjective Assessment - 05/27/21 0937     Subjective Got her back injections on Tuesday. Back and toe pain is feeling better today. Still having a little pain down side of R leg.    Patient is accompained by: Family member   spouse   Pertinent History PMH: osteoporosis    Limitations Walking;Standing;House hold activities    How long can you walk comfortably? with rollator about 10 minutes.    Diagnostic tests MRI of the lumbar spine which showed mild to moderate spinal stenosis at L4-5 and some other degenerative changes. NCS on 11/26/20: Normal study.  No electrodiagnostic evidence of large fiber neuropathy at this time. MRI cervical spine showed multilevel degenerative changes.  MRI brain  was unremarkable. 01/29/21 MRI of R hip; mild/moderate arthritis.    Patient Stated Goals walk with a cane and then no AD, improve her balance.    Currently in Pain? Yes    Pain Score 1     Pain Location Leg    Pain Orientation Right    Pain Descriptors / Indicators --   more sensitive when it is touched   Pain Type Chronic pain                               [facility] Adult PT Treatment/Exercise - 05/27/21 0939       Exercises   Exercises Other Exercises;Knee/Hip    Other Exercises  NuStep with BLE/BUE for strengthening, ROM and activity tolerance at gear 2 for 5 minutes. Cues to keep spm between 40-50.      Knee/Hip Exercises: Standing   Lateral Step Up Both;Hand Hold: 2;10 reps;1 set;Step Height: 4"    Lateral Step Up Limitations Pt needing to step both feet up and then down from the step due to incr hip pain instead of having single foot stable on the step.    Forward Step Up Both;1 set;10 reps;Hand Hold: 2;Step Height:  4"    Forward Step Up Limitations Attempted to perform with no UE support, pt unable to tolerate due to pain.                 Balance Exercises - 05/27/21 0956       Balance Exercises: Standing   Rockerboard Anterior/posterior;Limitations;Intermittent UE support    Rockerboard Limitations weight shifting x12 reps, then holding board steady x10 reps head turns.    Retro Gait 4 reps;Limitations    Retro Gait Limitations Down and back in // bars, cues for incr step length and weight bearing through RLE.    Sidestepping 4 reps;Limitations;Theraband    Theraband Level (Sidestepping) Level 1 (Yellow)    Sidestepping Limitations Cues for keeping toes pointed forwards and incr stance time on RLE, no resistance > yellow tband around thighs for hip ABD strengthening.                PT Education - 05/27/21 1041     Education Details Make sure pt is continuing her stretches at home and that husband can help use their foam roller on her IT  band at home as that seemed to be bothering her today.    Person(s) Educated Patient;Spouse    Methods Explanation    Comprehension Verbalized understanding              PT Short Term Goals - 05/13/21 1312       PT SHORT TERM GOAL #1   Title Pt and pt's spouse will be independent with initial HEP in order to build upon functional gains made in therapy. ALL STGS DUE 06/10/21    Time 4    Period Weeks    Status New    Target Date 06/10/21      PT SHORT TERM GOAL #2   Title Pt will improve gait speed with rollator to at least 1.9 ft/sec in order to demo decr fall risk.    Baseline 1.69 ft/sec    Time 4    Period Weeks    Status New      PT SHORT TERM GOAL #3   Title Pt will perform TUG with SPC vs. no AD in 30 seconds or less in order to demo decr fall risk.    Baseline 35.63 seconds with no AD, min A    Time 4    Period Weeks    Status New      PT SHORT TERM GOAL #4   Title Pt will go up and down 12 steps with single handrail with step to vs step through pattern with supervision in order to demo improved functional mobility at home.    Time 4    Period Weeks    Status New               PT Long Term Goals - 05/13/21 1315       PT LONG TERM GOAL #1   Title Pt and pt's spouse will be independent with final HEP in order to build upon functional gains made in therapy. ALL LTGS DUE 07/08/21    Time 8    Period Weeks    Status New    Target Date 07/08/21      PT LONG TERM GOAL #2   Title Pt will perform TUG with SPC vs. no AD in 24 seconds or less in order to demo decr fall risk.    Baseline 35.63 seconds with no AD, min A  Time 8    Period Weeks    Status New      PT LONG TERM GOAL #3   Title Pt will ambulate with SPC 230' with supervision over level indoor surfaces in order to demo improved household mobility.    Time 8    Period Weeks    Status New      PT LONG TERM GOAL #4   Title Pt will improve gait speed with rollator vs. LRAD to at least 2.4 ft/sec  in order to demo decr fall risk.    Baseline 1.69 ft/sec    Time 8    Period Weeks    Status New                   Plan - 05/27/21 1052     Clinical Impression Statement Pt recently got back injections on Tuesday and reports an improvement in R sided pain (but pain is still there). Worked on BLE strengthening and standing balance during session. Pt needing intermittent rest breaks due to incr pain. Unable to perform step ups to 4" step without UE support due to incr knee pain. Will continue to progress towards LTGs.    Personal Factors and Comorbidities Comorbidity 1;Time since onset of injury/illness/exacerbation;Past/Current Experience    Comorbidities osteoporosis, radiculopathy    Examination-Activity Limitations Locomotion Level;Lift;Hygiene/Grooming;Reach Overhead;Transfers;Stand;Stairs;Bathing    Examination-Participation Restrictions Community Activity;Driving;Cleaning;Laundry    Stability/Clinical Decision Making Evolving/Moderate complexity    Rehab Potential Good    PT Frequency 2x / week    PT Duration 12 weeks    PT Treatment/Interventions ADLs/Self Care Home Management;Aquatic Therapy;Gait training;DME Instruction;Stair training;Functional mobility training;Therapeutic activities;Neuromuscular re-education;Balance training;Therapeutic exercise;Patient/family education;Passive range of motion;Manual techniques;Energy conservation;Vestibular    PT Next Visit Plan how is HEP for hip/core strengthening? continue working on strengthening. Balance tasks. Gait training with SPC.    PT Home Exercise Plan [id number]    Consulted and Agree with Plan of Care Patient;Family member/caregiver    Family Member Consulted pt's husband             Patient will benefit from skilled therapeutic intervention in order to improve the following deficits and impairments:  Abnormal gait, Decreased balance, Decreased activity tolerance, Decreased mobility, Difficulty walking, Decreased  strength, Impaired sensation, Pain, Postural dysfunction, Decreased coordination, Decreased endurance, Increased muscle spasms, Impaired flexibility  Visit Diagnosis: Unsteadiness on feet  Other abnormalities of gait and mobility  Muscle weakness (generalized)     Problem List Patient Active Problem List   Diagnosis Date Noted   Histoplasmosis 08/21/2019   Chronic left shoulder pain 08/03/2017   Aortic atherosclerosis ([place]) 07/18/2017   Cholelithiasis 03/20/2012   Osteoporosis 08/09/2006    Arliss Journey, PT, DPT  05/27/2021, 10:54 AM  [facility] [address] [place], [place], [address] Phone: [phone]   Fax:  [phone]  Name: Brandi Villarreal MRN: [id number] Date of Birth: [birth year]

## 2021-06-01 ENCOUNTER — Ambulatory Visit: Payer: Medicare Other | Admitting: Physical Therapy

## 2021-06-01 ENCOUNTER — Other Ambulatory Visit: Payer: Self-pay

## 2021-06-01 ENCOUNTER — Encounter: Payer: Self-pay | Admitting: Physical Therapy

## 2021-06-01 DIAGNOSIS — R2689 Other abnormalities of gait and mobility: Secondary | ICD-10-CM | POA: Diagnosis not present

## 2021-06-01 DIAGNOSIS — R2681 Unsteadiness on feet: Secondary | ICD-10-CM | POA: Diagnosis not present

## 2021-06-01 DIAGNOSIS — R262 Difficulty in walking, not elsewhere classified: Secondary | ICD-10-CM | POA: Diagnosis not present

## 2021-06-01 DIAGNOSIS — M6281 Muscle weakness (generalized): Secondary | ICD-10-CM | POA: Diagnosis not present

## 2021-06-01 DIAGNOSIS — M79605 Pain in left leg: Secondary | ICD-10-CM | POA: Diagnosis not present

## 2021-06-01 NOTE — Therapy (Signed)
[facility] [address] [place], [place], [address] Phone: [phone]   Fax:  [phone]  Physical Therapy Treatment  Patient Details  Name: Brandi Villarreal MRN: [id number] Date of Birth: 09-18-1945 Referring Provider (PT): Eustace Moore, MD   Encounter Date: 06/01/2021   PT End of Session - 06/01/21 1021     Visit Number 4    Number of Visits 13    Date for PT Re-Evaluation 08/11/21    Authorization Type [employer] Medicare    PT Start Time 1018    PT Stop Time 1058    PT Time Calculation (min) 40 min    Equipment Utilized During Treatment Gait belt    Activity Tolerance Patient tolerated treatment well    Behavior During Therapy WFL for tasks assessed/performed             Past Medical History:  Diagnosis Date   Cholelithiasis    rare symptoms, declined surgery   Colitis ?1975   Hemorrhoids    Lichen sclerosus    sees gyn for management   Mitral valve prolapse    Osteoporosis    sees gyn   Right leg weakness    Varicose veins     Past Surgical History:  Procedure Laterality Date   jaw resection     RHINOPLASTY     TONSILLECTOMY     TUBAL LIGATION     bilteral    There were no vitals filed for this visit.   Subjective Assessment - 06/01/21 1021     Subjective Started taking CBD oil and reports not having any pain.    Patient is accompained by: Family member   spouse   Pertinent History PMH: osteoporosis    Limitations Walking;Standing;House hold activities    How long can you walk comfortably? with rollator about 10 minutes.    Diagnostic tests MRI of the lumbar spine which showed mild to moderate spinal stenosis at L4-5 and some other degenerative changes. NCS on 11/26/20: Normal study.  No electrodiagnostic evidence of large fiber neuropathy at this time. MRI cervical spine showed multilevel degenerative changes.  MRI brain was unremarkable. 01/29/21 MRI of R hip; mild/moderate arthritis.     Patient Stated Goals walk with a cane and then no AD, improve her balance.    Currently in Pain? No/denies                               [facility] Adult PT Treatment/Exercise - 06/01/21 1039       Ambulation/Gait   Ambulation/Gait Yes    Ambulation/Gait Assistance 5: Supervision;4: Min guard    Ambulation/Gait Assistance Details Cued to keep cane in LUE (opposite pt's more painful RLE). Initial cues for sequencing. Pt able to tolerate well, ambulates with a wide BOS. Pt reports she has been walking with a SPC at home. Showed pt and pt's spouse where to purchase a quad tip for home for incr stability and that for pt to ambulate with her spouse's supervision at home.    Ambulation Distance (Feet) 230 Feet   plus additional distances   Assistive device Straight cane   with 4 prong tip   Gait Pattern Step-through pattern;Decreased stance time - right;Decreased step length - left;Decreased hip/knee flexion - right;Decreased weight shift to right;Antalgic    Ambulation Surface Level;Indoor      Knee/Hip Exercises: Standing   Hip Abduction AROM;Stengthening;Both;1 set;10 reps;Knee straight;Limitations  Abduction Limitations Alternating legs each side, cues for technique and preventing trunk from leaning, at end of reps pt just tapping toes vs. keeping leg lifted in air.    Other Standing Knee Exercises Sit <> stands with red tband around thighs - cues first for hip ABD activation during transfer x10 reps      Knee/Hip Exercises: Supine   Other Supine Knee/Hip Exercises Hooklying hip ABD 2 sets of 10 each side with red tband resistance; cues for core activation throughout and slowed/controlled. Pt more challenged with using RLE. Performed x10 reps alternating marching with red tband, verbal/tactile cues for core activation throughout.                 Balance Exercises - 06/01/21 1038       Balance Exercises: Standing   Stepping Strategy Lateral;Foam/compliant  surface;Limitations    Stepping Strategy Limitations On air ex, alternating lateral steps x10 reps each leg,UE support > none, min guard and cued for foot clearance.    Other Standing Exercises With air ex: step up/up and down/down alternating legs x10 reps each side, needing single UE support                PT Education - 06/01/21 1118     Education Details Options to purchase 4 prong quad tip for cane at home for incr stability.    Person(s) Educated Patient;Spouse    Methods Explanation;Handout    Comprehension Verbalized understanding              PT Short Term Goals - 05/13/21 1312       PT SHORT TERM GOAL #1   Title Pt and pt's spouse will be independent with initial HEP in order to build upon functional gains made in therapy. ALL STGS DUE 06/10/21    Time 4    Period Weeks    Status New    Target Date 06/10/21      PT SHORT TERM GOAL #2   Title Pt will improve gait speed with rollator to at least 1.9 ft/sec in order to demo decr fall risk.    Baseline 1.69 ft/sec    Time 4    Period Weeks    Status New      PT SHORT TERM GOAL #3   Title Pt will perform TUG with SPC vs. no AD in 30 seconds or less in order to demo decr fall risk.    Baseline 35.63 seconds with no AD, min A    Time 4    Period Weeks    Status New      PT SHORT TERM GOAL #4   Title Pt will go up and down 12 steps with single handrail with step to vs step through pattern with supervision in order to demo improved functional mobility at home.    Time 4    Period Weeks    Status New               PT Long Term Goals - 05/13/21 1315       PT LONG TERM GOAL #1   Title Pt and pt's spouse will be independent with final HEP in order to build upon functional gains made in therapy. ALL LTGS DUE 07/08/21    Time 8    Period Weeks    Status New    Target Date 07/08/21      PT LONG TERM GOAL #2   Title Pt will perform TUG with SPC vs.  no AD in 24 seconds or less in order to demo decr fall  risk.    Baseline 35.63 seconds with no AD, min A    Time 8    Period Weeks    Status New      PT LONG TERM GOAL #3   Title Pt will ambulate with SPC 230' with supervision over level indoor surfaces in order to demo improved household mobility.    Time 8    Period Weeks    Status New      PT LONG TERM GOAL #4   Title Pt will improve gait speed with rollator vs. LRAD to at least 2.4 ft/sec in order to demo decr fall risk.    Baseline 1.69 ft/sec    Time 8    Period Weeks    Status New                   Plan - 06/01/21 1120     Clinical Impression Statement Worked on gait training with cane with quad tip with pt needing initial min guard and then supervision. Showed where to purchase quad tip for pt's cane at home. Remainder of session focused on BLE strengthening, core activation, and standing balance. Pt needing UE support for standing balance tasks on air ex. Pt needing cues throughout supine exercises for core activation. Will continue to progress towards LTGS.    Personal Factors and Comorbidities Comorbidity 1;Time since onset of injury/illness/exacerbation;Past/Current Experience    Comorbidities osteoporosis, radiculopathy    Examination-Activity Limitations Locomotion Level;Lift;Hygiene/Grooming;Reach Overhead;Transfers;Stand;Stairs;Bathing    Examination-Participation Restrictions Community Activity;Driving;Cleaning;Laundry    Stability/Clinical Decision Making Evolving/Moderate complexity    Rehab Potential Good    PT Frequency 2x / week    PT Duration 12 weeks    PT Treatment/Interventions ADLs/Self Care Home Management;Aquatic Therapy;Gait training;DME Instruction;Stair training;Functional mobility training;Therapeutic activities;Neuromuscular re-education;Balance training;Therapeutic exercise;Patient/family education;Passive range of motion;Manual techniques;Energy conservation;Vestibular    PT Next Visit Plan how is HEP for hip/core strengthening? continue  working on HIP strengthening (esp ABD/hip extensors). core exercises. Balance tasks. Gait training with SPC and quad tip.    PT Home Exercise Plan [id number]    Consulted and Agree with Plan of Care Patient;Family member/caregiver    Family Member Consulted pt's husband             Patient will benefit from skilled therapeutic intervention in order to improve the following deficits and impairments:  Abnormal gait, Decreased balance, Decreased activity tolerance, Decreased mobility, Difficulty walking, Decreased strength, Impaired sensation, Pain, Postural dysfunction, Decreased coordination, Decreased endurance, Increased muscle spasms, Impaired flexibility  Visit Diagnosis: Unsteadiness on feet  Other abnormalities of gait and mobility  Muscle weakness (generalized)     Problem List Patient Active Problem List   Diagnosis Date Noted   Histoplasmosis 08/21/2019   Chronic left shoulder pain 08/03/2017   Aortic atherosclerosis ([place]) 07/18/2017   Cholelithiasis 03/20/2012   Osteoporosis 08/09/2006    Arliss Journey, PT, DPT  06/01/2021, 11:21 AM  [facility] [address] [place], [place], [address] Phone: [phone]   Fax:  [phone]  Name: RANEE PEASLEY MRN: [id number] Date of Birth: 07/03/1945

## 2021-06-08 ENCOUNTER — Other Ambulatory Visit: Payer: Self-pay

## 2021-06-08 ENCOUNTER — Ambulatory Visit: Payer: Medicare Other | Attending: Diagnostic Neuroimaging | Admitting: Physical Therapy

## 2021-06-08 ENCOUNTER — Encounter: Payer: Self-pay | Admitting: Physical Therapy

## 2021-06-08 DIAGNOSIS — R2681 Unsteadiness on feet: Secondary | ICD-10-CM | POA: Insufficient documentation

## 2021-06-08 DIAGNOSIS — R2689 Other abnormalities of gait and mobility: Secondary | ICD-10-CM | POA: Insufficient documentation

## 2021-06-08 DIAGNOSIS — M6281 Muscle weakness (generalized): Secondary | ICD-10-CM | POA: Diagnosis not present

## 2021-06-08 NOTE — Therapy (Signed)
[facility] [address] [place], [place], [address] Phone: [phone]   Fax:  [phone]  Physical Therapy Treatment  Patient Details  Name: Brandi Villarreal MRN: [id number] Date of Birth: 04-Feb-1946 Referring Provider (PT): Eustace Moore, MD   Encounter Date: 06/08/2021   PT End of Session - 06/08/21 1106     Visit Number 5    Number of Visits 13    Date for PT Re-Evaluation 08/11/21    Authorization Type UHC Medicare    PT Start Time 1103    PT Stop Time 1143    PT Time Calculation (min) 40 min    Equipment Utilized During Treatment Gait belt    Activity Tolerance Patient tolerated treatment well    Behavior During Therapy WFL for tasks assessed/performed             Past Medical History:  Diagnosis Date   Cholelithiasis    rare symptoms, declined surgery   Colitis ?1975   Hemorrhoids    Lichen sclerosus    sees gyn for management   Mitral valve prolapse    Osteoporosis    sees gyn   Right leg weakness    Varicose veins     Past Surgical History:  Procedure Laterality Date   jaw resection     RHINOPLASTY     TONSILLECTOMY     TUBAL LIGATION     bilteral    There were no vitals filed for this visit.   Subjective Assessment - 06/08/21 1104     Subjective No new complaints. No falls. Some right thigh and low back pain today. Has ordered the rubber quad tip, should be there tomorrow.    Patient is accompained by: Family member   spouse   Pertinent History PMH: osteoporosis    Limitations Walking;Standing;House hold activities    How long can you walk comfortably? with rollator about 10 minutes.    Diagnostic tests MRI of the lumbar spine which showed mild to moderate spinal stenosis at L4-5 and some other degenerative changes. NCS on 11/26/20: Normal study.  No electrodiagnostic evidence of large fiber neuropathy at this time. MRI cervical spine showed multilevel degenerative changes.  MRI  brain was unremarkable. 01/29/21 MRI of R hip; mild/moderate arthritis.    Patient Stated Goals walk with a cane and then no AD, improve her balance.    Currently in Pain? Yes    Pain Location Generalized    Pain Orientation Right;Lower    Pain Descriptors / Indicators Sore    Pain Type Chronic pain    Pain Onset More than a month ago    Pain Frequency Intermittent    Aggravating Factors  increased standing    Pain Relieving Factors resting.                [facility] PT Assessment - 06/08/21 1107       Timed Up and Go Test   TUG Normal TUG    Normal TUG (seconds) 21.38   with cane; 20.44 sec's no AD with min assist   TUG Comments increased assistance needed for balance with no AD vs cane                  [facility] Adult PT Treatment/Exercise - 06/08/21 1107       Transfers   Transfers Stand to Sit;Sit to Stand    Sit to Stand 5: Supervision    Stand to Sit 5: Supervision  Ambulation/Gait   Ambulation/Gait Yes    Ambulation/Gait Assistance 5: Supervision;4: Min guard    Ambulation/Gait Assistance Details no cues or assistance needed with use of rollator; cues on posture and sequencing needed with use of cane.    Ambulation Distance (Feet) 220 Feet   x1 with rollator; 115   Assistive device Straight cane;Rollator   cane with rubber quad tip   Gait Pattern Step-through pattern;Decreased stance time - right;Decreased step length - left;Decreased hip/knee flexion - right;Decreased weight shift to right;Antalgic    Ambulation Surface Level;Indoor    Gait velocity 16.03 seconds= 2.04 ft/sec with rollator    Stairs Yes    Stairs Assistance 4: Min guard    Stairs Assistance Details (indicate cue type and reason) needed cues for correct sequencing with step to gait pattern, min guard assist for safety.    Stair Management Technique One rail Right;Step to pattern;Forwards    Number of Stairs 4   x2 reps   Height of Stairs 6      Self-Care   Self-Care Other Self-Care  Comments    Other Self-Care Comments  reviewed HEP. no issues per patient report. still challenging per pt report.                 Balance Exercises - 06/08/21 1132       Balance Exercises: Standing   Standing Eyes Opened Foam/compliant surface;Wide (BOA);Head turns;5 reps;Limitations    Standing Eyes Opened Limitations on airex with light to no UE support for head movements left<>right, up<>down with min guard to min assist for balance. pt with tendency toward posterior lean/balance loss bias. cues for weight shifting to help correct this.    Stepping Strategy Lateral;Foam/compliant surface;Limitations    Stepping Strategy Limitations On air ex, alternating lateral steps x10 reps each leg,UE support (light touch), min guard assist for safety with cues for increased step length/height for clearance of airex.    Other Standing Exercises Comments on airex with light support on sturdy surfaces- alternaring slow marching for 10 reps each side with min guard assist.                  PT Short Term Goals - 06/08/21 1334       PT SHORT TERM GOAL #1   Title Pt and pt's spouse will be independent with initial HEP in order to build upon functional gains made in therapy. ALL STGS DUE 06/10/21    Baseline 06/08/21: met with current program    Status Achieved    Target Date 06/10/21      PT SHORT TERM GOAL #2   Title Pt will improve gait speed with rollator to at least 1.9 ft/sec in order to demo decr fall risk.    Baseline 06/08/21: 2.04 ft/sec with rollator    Time --    Period --    Status Achieved      PT SHORT TERM GOAL #3   Title Pt will perform TUG with SPC vs. no AD in 30 seconds or less in order to demo decr fall risk.    Baseline 06/08/21: 21.38 sec's with cane, 20.44 sec's no AD with min assist needed    Status Achieved      PT SHORT TERM GOAL #4   Title Pt will go up and down 12 steps with single handrail with step to vs step through pattern with supervision in order to  demo improved functional mobility at home.    Baseline 06/08/21: 8 steps  with single rail step to pattern with min guard assist, progress, just not to goal level    Status Partially Met               PT Long Term Goals - 05/13/21 1315       PT LONG TERM GOAL #1   Title Pt and pt's spouse will be independent with final HEP in order to build upon functional gains made in therapy. ALL LTGS DUE 07/08/21    Time 8    Period Weeks    Status New    Target Date 07/08/21      PT LONG TERM GOAL #2   Title Pt will perform TUG with SPC vs. no AD in 24 seconds or less in order to demo decr fall risk.    Baseline 35.63 seconds with no AD, min A    Time 8    Period Weeks    Status New      PT LONG TERM GOAL #3   Title Pt will ambulate with SPC 230' with supervision over level indoor surfaces in order to demo improved household mobility.    Time 8    Period Weeks    Status New      PT LONG TERM GOAL #4   Title Pt will improve gait speed with rollator vs. LRAD to at least 2.4 ft/sec in order to demo decr fall risk.    Baseline 1.69 ft/sec    Time 8    Period Weeks    Status New                   Plan - 06/08/21 1106     Clinical Impression Statement Today's skilled session initially focused on progress toward STGs with goals fully met except for stair goal partially met. Remainder of session continued to focus on gait training with straight cane with rubber quad tip and balance training. No issues noted or reported with mild increase in left quad soreness that would improve with rest breaks. The pt is making steady progress toward goals and should benefit from continued PT to progress toward unmet goals.    Personal Factors and Comorbidities Comorbidity 1;Time since onset of injury/illness/exacerbation;Past/Current Experience    Comorbidities osteoporosis, radiculopathy    Examination-Activity Limitations Locomotion Level;Lift;Hygiene/Grooming;Reach  Overhead;Transfers;Stand;Stairs;Bathing    Examination-Participation Restrictions Community Activity;Driving;Cleaning;Laundry    Stability/Clinical Decision Making Evolving/Moderate complexity    Rehab Potential Good    PT Frequency 2x / week    PT Duration 12 weeks    PT Treatment/Interventions ADLs/Self Care Home Management;Aquatic Therapy;Gait training;DME Instruction;Stair training;Functional mobility training;Therapeutic activities;Neuromuscular re-education;Balance training;Therapeutic exercise;Patient/family education;Passive range of motion;Manual techniques;Energy conservation;Vestibular    PT Next Visit Plan how is HEP for hip/core strengthening? continue working on HIP strengthening (esp ABD/hip extensors). core exercises. Balance tasks. Gait training with SPC and quad tip.    PT Home Exercise Plan [id number]    Consulted and Agree with Plan of Care Patient;Family member/caregiver    Family Member Consulted pt's husband             Patient will benefit from skilled therapeutic intervention in order to improve the following deficits and impairments:  Abnormal gait, Decreased balance, Decreased activity tolerance, Decreased mobility, Difficulty walking, Decreased strength, Impaired sensation, Pain, Postural dysfunction, Decreased coordination, Decreased endurance, Increased muscle spasms, Impaired flexibility  Visit Diagnosis: Unsteadiness on feet  Other abnormalities of gait and mobility  Muscle weakness (generalized)     Problem List Patient Active Problem  List   Diagnosis Date Noted   Histoplasmosis 08/21/2019   Chronic left shoulder pain 08/03/2017   Aortic atherosclerosis ([place]) 07/18/2017   Cholelithiasis 03/20/2012   Osteoporosis 08/09/2006    Willow Ora, PTA, [facility] [address] [place], [place] [address] [phone] 06/08/21, 1:42 PM   Name: Brandi Villarreal MRN: [id number] Date of Birth: [birth year]

## 2021-06-10 DIAGNOSIS — M4316 Spondylolisthesis, lumbar region: Secondary | ICD-10-CM | POA: Diagnosis not present

## 2021-06-10 DIAGNOSIS — R030 Elevated blood-pressure reading, without diagnosis of hypertension: Secondary | ICD-10-CM | POA: Diagnosis not present

## 2021-06-10 DIAGNOSIS — M5416 Radiculopathy, lumbar region: Secondary | ICD-10-CM | POA: Diagnosis not present

## 2021-06-14 ENCOUNTER — Other Ambulatory Visit: Payer: Self-pay | Admitting: Neurological Surgery

## 2021-06-15 ENCOUNTER — Other Ambulatory Visit: Payer: Self-pay

## 2021-06-15 ENCOUNTER — Encounter: Payer: Self-pay | Admitting: Physical Therapy

## 2021-06-15 ENCOUNTER — Ambulatory Visit: Payer: Medicare Other | Admitting: Physical Therapy

## 2021-06-15 DIAGNOSIS — R2681 Unsteadiness on feet: Secondary | ICD-10-CM | POA: Diagnosis not present

## 2021-06-15 DIAGNOSIS — M6281 Muscle weakness (generalized): Secondary | ICD-10-CM | POA: Diagnosis not present

## 2021-06-15 DIAGNOSIS — R2689 Other abnormalities of gait and mobility: Secondary | ICD-10-CM

## 2021-06-15 NOTE — Therapy (Signed)
[facility] ?[place], [place], [address] ?Phone: [phone]   Fax:  [phone] ? ?Physical Therapy Treatment ? ?Patient Details  ?Name: Brandi Villarreal ?MRN: [id number] ?Date of Birth: 1945-11-08 ?Referring Provider (PT): Eustace Moore, MD ? ? ?Encounter Date: 06/15/2021 ? ? PT End of Session - 06/15/21 1154   ? ? Visit Number 6   ? Number of Visits 13   ? Date for PT Re-Evaluation 08/11/21   ? Authorization Type UHC Medicare   ? PT Start Time 1147   ? PT Stop Time 1227   ? PT Time Calculation (min) 40 min   ? Equipment Utilized During Treatment Gait belt   ? Activity Tolerance Patient tolerated treatment well   ? Behavior During Therapy [facility] for tasks assessed/performed   ? ?  ?  ? ?  ? ? ?Past Medical History:  ?Diagnosis Date  ? Cholelithiasis   ? rare symptoms, declined surgery  ? Colitis ?1975  ? Hemorrhoids   ? Lichen sclerosus   ? sees gyn for management  ? Mitral valve prolapse   ? Osteoporosis   ? sees gyn  ? Right leg weakness   ? Varicose veins   ? ? ?Past Surgical History:  ?Procedure Laterality Date  ? jaw resection    ? RHINOPLASTY    ? TONSILLECTOMY    ? TUBAL LIGATION    ? bilteral  ? ? ?There were no vitals filed for this visit. ? ? Subjective Assessment - 06/15/21 1151   ? ? Subjective Saw the neurosurgeon recently and will be getting surgery for L4-L5 on 06/28/21. No falls. Bothered her R ankle last night.   ? Patient is accompained by: Family member   spouse  ? Pertinent History PMH: osteoporosis   ? Limitations Walking;Standing;House hold activities   ? How long can you walk comfortably? with rollator about 10 minutes.   ? Diagnostic tests MRI of the lumbar spine which showed mild to moderate spinal stenosis at L4-5 and some other degenerative changes. NCS on 11/26/20: Normal study.  No electrodiagnostic evidence of large fiber neuropathy at this time. MRI cervical spine showed multilevel degenerative changes.  MRI brain  was unremarkable. 01/29/21 MRI of R hip; mild/moderate arthritis.   ? Patient Stated Goals walk with a cane and then no AD, improve her balance.   ? Currently in Pain? Yes   ? Pain Score 2    ? Pain Location Ankle   ? Pain Orientation Right   medial  ? Pain Descriptors / Indicators Sore   ? Pain Type Acute pain   ? Pain Onset More than a month ago   ? ?  ?  ? ?  ? ? ? ? ? ? ? ? ? ? ? ? ? ? ? ? ? ? ? ?Ther Ex: ?On blue physioball: ?-A/P pelvic tilts x12 reps, lateral weight shifting  ?-alternating marching x10 reps each side ?-alternating LAQs x10 reps each side - incr difficulty with shifting weight to R side. ? ?Min guard for balance at times. Cues throughout for core activation.  ? ?Supine Strengthening: ?Bridge: 1 Sets 10 Reps - with use of green tband around thighs for hip ABD activation, cues to press out into band before lifting  ?Straight Leg Raise: 1 Sets 10 Reps - bilat, pt challenged with use of RLE and perform with partial range, verbal/tactile cues for technique ?Lower trunk rotations x5 reps each side, then adding in  yoga block squeeze for hip ADD x8 reps each side, cues for breathing and core activation.  ? ?Sidelying clamshells-x10 reps each side. Pt reporting incr pain in R side.  ? ?Seated on green balance disc: ?-Holding 1# ball min crunches in sitting with verbal/demo cues for technique x10 reps ?-trunk rotations holding 1# ball x8 reps each side, pt with incr difficulty with rotating to R ? ? ? ? ? ? ? ? ? ? ? ? PT Short Term Goals - 06/08/21 1334   ? ?  ? PT SHORT TERM GOAL #1  ? Title Pt and pt's spouse will be independent with initial HEP in order to build upon functional gains made in therapy. ALL STGS DUE 06/10/21   ? Baseline 06/08/21: met with current program   ? Status Achieved   ? Target Date 06/10/21   ?  ? PT SHORT TERM GOAL #2  ? Title Pt will improve gait speed with rollator to at least 1.9 ft/sec in order to demo decr fall risk.   ? Baseline 06/08/21: 2.04 ft/sec with rollator   ? Time --    ? Period --   ? Status Achieved   ?  ? PT SHORT TERM GOAL #3  ? Title Pt will perform TUG with SPC vs. no AD in 30 seconds or less in order to demo decr fall risk.   ? Baseline 06/08/21: 21.38 sec's with cane, 20.44 sec's no AD with min assist needed   ? Status Achieved   ?  ? PT SHORT TERM GOAL #4  ? Title Pt will go up and down 12 steps with single handrail with step to vs step through pattern with supervision in order to demo improved functional mobility at home.   ? Baseline 06/08/21: 8 steps with single rail step to pattern with min guard assist, progress, just not to goal level   ? Status Partially Met   ? ?  ?  ? ?  ? ? ? ? PT Long Term Goals - 05/13/21 1315   ? ?  ? PT LONG TERM GOAL #1  ? Title Pt and pt's spouse will be independent with final HEP in order to build upon functional gains made in therapy. ALL LTGS DUE 07/08/21   ? Time 8   ? Period Weeks   ? Status New   ? Target Date 07/08/21   ?  ? PT LONG TERM GOAL #2  ? Title Pt will perform TUG with SPC vs. no AD in 24 seconds or less in order to demo decr fall risk.   ? Baseline 35.63 seconds with no AD, min A   ? Time 8   ? Period Weeks   ? Status New   ?  ? PT LONG TERM GOAL #3  ? Title Pt will ambulate with SPC 230' with supervision over level indoor surfaces in order to demo improved household mobility.   ? Time 8   ? Period Weeks   ? Status New   ?  ? PT LONG TERM GOAL #4  ? Title Pt will improve gait speed with rollator vs. LRAD to at least 2.4 ft/sec in order to demo decr fall risk.   ? Baseline 1.69 ft/sec   ? Time 8   ? Period Weeks   ? Status New   ? ?  ?  ? ?  ? ? ? ? ? ? ? ? Plan - 06/15/21 1304   ? ? Clinical Impression Statement  Pt will be undergoing lumbar surgery PLIF - L4-L5 at the end of March. Will see pt for one more visit and then D/C. Discussed after surgery, pt would likely be more appropriate for an ortho based PT clinic. Worked on BLE strengthening and core activation exercises today. Continued to encourage pt to perform exercises  at home to build up strength before her surgery.   ? Personal Factors and Comorbidities Comorbidity 1;Time since onset of injury/illness/exacerbation;Past/Current Experience   ? Comorbidities osteoporosis, radiculopathy   ? Examination-Activity Limitations Locomotion Level;Lift;Hygiene/Grooming;Reach Overhead;Transfers;Stand;Stairs;Bathing   ? Examination-Participation Restrictions Community Activity;Driving;Cleaning;Laundry   ? Stability/Clinical Decision Making Evolving/Moderate complexity   ? Rehab Potential Good   ? PT Frequency 2x / week   ? PT Duration 12 weeks   ? PT Treatment/Interventions ADLs/Self Care Home Management;Aquatic Therapy;Gait training;DME Instruction;Stair training;Functional mobility training;Therapeutic activities;Neuromuscular re-education;Balance training;Therapeutic exercise;Patient/family education;Passive range of motion;Manual techniques;Energy conservation;Vestibular   ? PT Next Visit Plan D/C this session. how is HEP for hip/core strengthening? continue working on HIP strengthening (esp ABD/hip extensors). core exercises. Balance tasks. Gait training with SPC and quad tip.   ? PT Home Exercise Plan [id number]   ? Consulted and Agree with Plan of Care Patient;Family member/caregiver   ? Family Member Consulted pt's husband   ? ?  ?  ? ?  ? ? ?Patient will benefit from skilled therapeutic intervention in order to improve the following deficits and impairments:  Abnormal gait, Decreased balance, Decreased activity tolerance, Decreased mobility, Difficulty walking, Decreased strength, Impaired sensation, Pain, Postural dysfunction, Decreased coordination, Decreased endurance, Increased muscle spasms, Impaired flexibility ? ?Visit Diagnosis: ?Unsteadiness on feet ? ?Other abnormalities of gait and mobility ? ?Muscle weakness (generalized) ? ? ? ? ?Problem List ?Patient Active Problem List  ? Diagnosis Date Noted  ? Histoplasmosis 08/21/2019  ? Chronic left shoulder pain 08/03/2017  ? Aortic  atherosclerosis ([place]) 07/18/2017  ? Cholelithiasis 03/20/2012  ? Osteoporosis 08/09/2006  ? ? ?Arliss Journey, PT, DPT  ?06/15/2021, 1:06 PM ? ?[facility]

## 2021-06-21 NOTE — Pre-Procedure Instructions (Signed)
Surgical Instructions ? ? ? Your procedure is scheduled on Monday 06/28/21. ? ? Report to [facility] Main Entrance "A" at 06:45 A.M., then check in with the Admitting office. ? Call this number if you have problems the morning of surgery: ? [phone] ? ? If you have any questions prior to your surgery date call [phone]: Open Monday-Friday 8am-4pm ? ? ? Remember: ? Do not eat or drink after midnight the night before your surgery ? ?  ? Take these medicines the morning of surgery with A SIP OF WATER:  ? ? Take these medicines if needed:  ? acetaminophen (TYLENOL)  ? albuterol (VENTOLIN HFA) 108 (90 Base) ? methocarbamol (ROBAXIN) ? ? ?As of today, STOP taking any Aspirin (unless otherwise instructed by your surgeon) Aleve, Naproxen, Ibuprofen, Motrin, Advil, Goody's, BC's, all herbal medications, fish oil, and all vitamins. ? ?         ?Do not wear jewelry or makeup ?Do not wear lotions, powders, perfumes/colognes, or deodorant. ?Do not shave 48 hours prior to surgery.  Men may shave face and neck. ?Do not bring valuables to the hospital. ?Do not wear nail polish, gel polish, artificial nails, or any other type of covering on natural nails (fingers and toes) ?If you have artificial nails or gel coating that need to be removed by a nail salon, please have this removed prior to surgery. Artificial nails or gel coating may interfere with anesthesia's ability to adequately monitor your vital signs. ? ?[facility] is not responsible for any belongings or valuables. .  ? ?Do NOT Smoke (Tobacco/Vaping)  24 hours prior to your procedure ? ?If you use a CPAP at night, you may bring your mask for your overnight stay. ?  ?Contacts, glasses, hearing aids, dentures or partials may not be worn into surgery, please bring cases for these belongings ?  ?For patients admitted to the hospital, discharge time will be determined by your treatment team. ?  ?Patients discharged the day of surgery will not be allowed to drive home,  and someone needs to stay with them for 24 hours. ? ?NO VISITORS WILL BE ALLOWED IN PRE-OP WHERE PATIENTS ARE PREPPED FOR SURGERY.  ONLY 1 SUPPORT PERSON MAY BE PRESENT IN THE WAITING ROOM WHILE YOU ARE IN SURGERY.  IF YOU ARE TO BE ADMITTED, ONCE YOU ARE IN YOUR ROOM YOU WILL BE ALLOWED TWO (2) VISITORS. 1 (ONE) VISITOR MAY STAY OVERNIGHT BUT MUST ARRIVE TO THE ROOM BY 8pm.  Minor children may have two parents present. Special consideration for safety and communication needs will be reviewed on a case by case basis. ? ?Special instructions:   ? ?Oral Hygiene is also important to reduce your risk of infection.  Remember - BRUSH YOUR TEETH THE MORNING OF SURGERY WITH YOUR REGULAR TOOTHPASTE ? ? ?[facility]- Preparing For Surgery ? ?Before surgery, you can play an important role. Because skin is not sterile, your skin needs to be as free of germs as possible. You can reduce the number of germs on your skin by washing with CHG (chlorahexidine gluconate) Soap before surgery.  CHG is an antiseptic cleaner which kills germs and bonds with the skin to continue killing germs even after washing.   ? ? ?Please do not use if you have an allergy to CHG or antibacterial soaps. If your skin becomes reddened/irritated stop using the CHG.  ?Do not shave (including legs and underarms) for at least 48 hours prior to first CHG shower. It is  OK to shave your face. ? ?Please follow these instructions carefully. ?  ? ? Shower the NIGHT BEFORE SURGERY and the MORNING OF SURGERY with CHG Soap.  ? If you chose to wash your hair, wash your hair first as usual with your normal shampoo. After you shampoo, rinse your hair and body thoroughly to remove the shampoo.  Then [employer] and genitals (private parts) with your normal soap and rinse thoroughly to remove soap. ? ?After that Use CHG Soap as you would any other liquid soap. You can apply CHG directly to the skin and wash gently with a scrungie or a clean washcloth.  ? ?Apply the CHG Soap  to your body ONLY FROM THE NECK DOWN.  Do not use on open wounds or open sores. Avoid contact with your eyes, ears, mouth and genitals (private parts). Wash Face and genitals (private parts)  with your normal soap.  ? ?Wash thoroughly, paying special attention to the area where your surgery will be performed. ? ?Thoroughly rinse your body with warm water from the neck down. ? ?DO NOT shower/wash with your normal soap after using and rinsing off the CHG Soap. ? ?Pat yourself dry with a CLEAN TOWEL. ? ?Wear CLEAN PAJAMAS to bed the night before surgery ? ?Place CLEAN SHEETS on your bed the night before your surgery ? ?DO NOT SLEEP WITH PETS. ? ? ?Day of Surgery: ? ?Take a shower with CHG soap. ?Wear Clean/Comfortable clothing the morning of surgery ?Do not apply any deodorants/lotions.   ?Remember to brush your teeth WITH YOUR REGULAR TOOTHPASTE. ? ? ? ?COVID testing ? ?If you are going to stay overnight or be admitted after your procedure/surgery and require a pre-op COVID test, please follow these instructions after your COVID test  ? ?You are not required to quarantine however you are required to wear a well-fitting mask when you are out and around people not in your household.  If your mask becomes wet or soiled, replace with a new one. ? ?Wash your hands often with soap and water for 20 seconds or clean your hands with an alcohol-based hand sanitizer that contains at least 60% alcohol. ? ?Do not share personal items. ? ?Notify your provider: ?if you are in close contact with someone who has COVID  ?or if you develop a fever of 100.4 or greater, sneezing, cough, sore throat, shortness of breath or body aches. ? ?  ?Please read over the following fact sheets that you were given.  ? ?

## 2021-06-22 ENCOUNTER — Encounter (HOSPITAL_COMMUNITY): Payer: Self-pay

## 2021-06-22 ENCOUNTER — Other Ambulatory Visit: Payer: Self-pay

## 2021-06-22 ENCOUNTER — Encounter (HOSPITAL_COMMUNITY)
Admission: RE | Admit: 2021-06-22 | Discharge: 2021-06-22 | Disposition: A | Discharge status: Home / Self Care | Payer: Medicare Other | Source: Ambulatory Visit | Attending: Neurological Surgery | Admitting: Neurological Surgery

## 2021-06-22 ENCOUNTER — Ambulatory Visit: Payer: Medicare Other | Admitting: Physical Therapy

## 2021-06-22 VITALS — BP 133/69 | HR 87 | Temp 98.3°F | Resp 18 | Ht 62.0 in | Wt 165.3 lb

## 2021-06-22 DIAGNOSIS — R2681 Unsteadiness on feet: Secondary | ICD-10-CM

## 2021-06-22 DIAGNOSIS — R2689 Other abnormalities of gait and mobility: Secondary | ICD-10-CM

## 2021-06-22 DIAGNOSIS — M6281 Muscle weakness (generalized): Secondary | ICD-10-CM | POA: Diagnosis not present

## 2021-06-22 DIAGNOSIS — Z01818 Encounter for other preprocedural examination: Secondary | ICD-10-CM | POA: Insufficient documentation

## 2021-06-22 LAB — BASIC METABOLIC PANEL
Anion gap: 9 (ref 5–15)
BUN: 17 mg/dL (ref 8–23)
CO2: 24 mmol/L (ref 22–32)
Calcium: 9 mg/dL (ref 8.9–10.3)
Chloride: 107 mmol/L (ref 98–111)
Creatinine, Ser: 0.88 mg/dL (ref 0.44–1.00)
GFR, Estimated: >60 mL/min (ref >60)
Glucose, Bld: 86 mg/dL (ref 70–99)
Potassium: 4.1 mmol/L (ref 3.5–5.1)
Sodium: 140 mmol/L (ref 135–145)

## 2021-06-22 LAB — TYPE AND SCREEN
ABO/RH(D): O POS
Antibody Screen: NEGATIVE

## 2021-06-22 LAB — PROTIME-INR
INR: 0.9 (ref 0.8–1.2)
Prothrombin Time: 12.6 seconds (ref 11.4–15.2)

## 2021-06-22 LAB — CBC
HCT: 38.6 % (ref 36.0–46.0)
Hemoglobin: 11.6 g/dL — ABNORMAL LOW (ref 12.0–15.0)
MCH: 29 pg (ref 26.0–34.0)
MCHC: 30.1 g/dL (ref 30.0–36.0)
MCV: 96.5 fL (ref 80.0–100.0)
Platelets: 259 10*3/uL (ref 150–400)
RBC: 4 MIL/uL (ref 3.87–5.11)
RDW: 13 % (ref 11.5–15.5)
WBC: 5.4 10*3/uL (ref 4.0–10.5)
nRBC: 0 % (ref 0.0–0.2)

## 2021-06-22 LAB — SURGICAL PCR SCREEN

## 2021-06-22 NOTE — Progress Notes (Addendum)
PCP - Dr. Bonna Gains ?Cardiologist - denies ? ?PPM/ICD - n/a ?Device Orders - n/a ?Rep Notified - n/a ? ?Chest x-ray - n/a ?EKG - 06/22/21 ?Stress Test - Many years ago per patient ?ECHO - denies ?Cardiac Cath - denies ? ?Sleep Study - denies ?CPAP - n/a ? ?Fasting Blood Sugar - n/a ?Checks Blood Sugar _____ times a day- n/a ? ?Blood Thinner Instructions: n/a ?Aspirin Instructions: n/a ? ?ERAS Protcol - No. NPO ?PRE-SURGERY Ensure or G2- n/a ? ?COVID TEST- n/a ? ? ?Anesthesia review: No ? ?Patient denies shortness of breath, fever, cough and chest pain at PAT appointment ? ? ?All instructions explained to the patient, with a verbal understanding of the material. Patient agrees to go over the instructions while at home for a better understanding. The opportunity to ask questions was provided. ? ? ?

## 2021-06-22 NOTE — Therapy (Signed)
[facility], [place], [address] ?Phone: [phone]   Fax:  [phone] ? ?Physical Therapy Treatment/Discharge Summary ? ?Patient Details  ?Name: Brandi Villarreal ?MRN: [id number] ?Date of Birth: [birth year] ?Referring Provider (PT): Eustace Moore, MD ? ? ?Encounter Date: 06/22/2021 ? ? PT End of Session - 06/22/21 1150   ? ? Visit Number 7   ? Number of Visits 13   ? Date for PT Re-Evaluation 08/11/21   ? Authorization Type [employer] Medicare   ? PT Start Time 1148   ? PT Stop Time 1228   ? PT Time Calculation (min) 40 min   ? Equipment Utilized During Treatment Gait belt   ? Activity Tolerance Patient tolerated treatment well   ? Behavior During Therapy [facility] for tasks assessed/performed   ? ?  ?  ? ?  ? ? ?Past Medical History:  ?Diagnosis Date  ? Cholelithiasis   ? rare symptoms, declined surgery  ? Colitis ?1975  ? Hemorrhoids   ? Lichen sclerosus   ? sees gyn for management  ? Mitral valve prolapse   ? Osteoporosis   ? sees gyn  ? Right leg weakness   ? Varicose veins   ? ? ?Past Surgical History:  ?Procedure Laterality Date  ? jaw resection    ? RHINOPLASTY    ? TONSILLECTOMY    ? TUBAL LIGATION    ? bilteral  ? ? ?There were no vitals filed for this visit. ? ? Subjective Assessment - 06/22/21 1150   ? ? Subjective Feels like she is walking better. Surgery is scheduled on March 27th.   ? Patient is accompained by: Family member   spouse  ? Pertinent History PMH: osteoporosis   ? Limitations Walking;Standing;House hold activities   ? How long can you walk comfortably? with rollator about 10 minutes.   ? Diagnostic tests MRI of the lumbar spine which showed mild to moderate spinal stenosis at L4-5 and some other degenerative changes. NCS on 11/26/20: Normal study.  No electrodiagnostic evidence of large fiber neuropathy at this time. MRI cervical spine showed multilevel degenerative changes.  MRI brain was unremarkable. 01/29/21 MRI of R  hip; mild/moderate arthritis.   ? Patient Stated Goals walk with a cane and then no AD, improve her balance.   ? Currently in Pain? No/denies   ? Pain Onset More than a month ago   ? ?  ?  ? ?  ? ? ? ? ? [facility] PT Assessment - 06/22/21 1151   ? ?  ? Timed Up and Go Test  ? TUG Normal TUG   ? Normal TUG (seconds) 21.56   with cane  ? ?  ?  ? ?  ? ? ? ? ? ? ? ? ? ? ? ? ? ? ? ? [facility] PT Treatment/Exercise - 06/22/21 1151   ? ?  ? Ambulation/Gait  ? Ambulation/Gait Yes   ? Ambulation/Gait Assistance 5: Supervision;4: Min guard   ? Ambulation/Gait Assistance Details Pt holding cane in LUE (needs initial cues for this) as RLE is the weaker/more painful side. Needed a seated rest break after 2 laps due to fatigue. An additional 24' with scanning environment with head motions with min guard   ? Ambulation Distance (Feet) 230 Feet   x1, 115 x 1  ? Assistive device Straight cane;Rollator   with 4 prong tip  ? Gait Pattern Step-through pattern;Decreased stance time - right;Decreased step length -  left;Decreased hip/knee flexion - right;Decreased weight shift to right;Antalgic   ? Ambulation Surface Level;Indoor   ? Gait velocity 16.51 seconds = 1.98 ft/sec with rollator   ?  ? High Level Balance  ? High Level Balance Activities Negotiating over obstacles   ? High Level Balance Comments Stepping over 4 obstacles (2" or less) with use of cane at countertop - down and back x4 reps, cues for sequencing with cane, pt needing 2 episodes of min A for balance.   ?  ? Exercises  ? Exercises Other Exercises;Knee/Hip   ? Other Exercises  At countertop with red tband around thighs: side stepping down and back x3 reps - cues to keep toes forwards for hip ABD activation, forwards/retro gait down and back x4 reps - cues for incr RLE step length going backwards for hip extension. Seated on blue physioball: alternating LAQs x10 reps, then progressing to marching and lifting recirpocal arm x10 reps each side, min guard/min A for balance.  Cues for slowed pace and core activation for balance.   ? ?  ?  ? ?  ? ? ? ? ? ?PHYSICAL THERAPY DISCHARGE SUMMARY ? ?Visits from Start of Care: 7 ? ?Current functional level related to goals / functional outcomes: ?See LTGs/clinical assessment statement. ?  ?Remaining deficits: ?Decr strength, decr endurance, RLE pain, impaired balance, gait abnormalities. ?  ?Education / Equipment: ?HEP   ? ?Patient agrees to discharge. Patient goals were partially met. Patient is being discharged due to  pt undergoing lumbar surgery (PLIF L4-L5) on 06/28/21. Pt will receive ortho PT afterwards.  ? ? ? ? ? ? ? PT Short Term Goals - 06/08/21 1334   ? ?  ? PT SHORT TERM GOAL #1  ? Title Pt and pt's spouse will be independent with initial HEP in order to build upon functional gains made in therapy. ALL STGS DUE 06/10/21   ? Baseline 06/08/21: met with current program   ? Status Achieved   ? Target Date 06/10/21   ?  ? PT SHORT TERM GOAL #2  ? Title Pt will improve gait speed with rollator to at least 1.9 ft/sec in order to demo decr fall risk.   ? Baseline 06/08/21: 2.04 ft/sec with rollator   ? Time --   ? Period --   ? Status Achieved   ?  ? PT SHORT TERM GOAL #3  ? Title Pt will perform TUG with SPC vs. no AD in 30 seconds or less in order to demo decr fall risk.   ? Baseline 06/08/21: 21.38 sec's with cane, 20.44 sec's no AD with min assist needed   ? Status Achieved   ?  ? PT SHORT TERM GOAL #4  ? Title Pt will go up and down 12 steps with single handrail with step to vs step through pattern with supervision in order to demo improved functional mobility at home.   ? Baseline 06/08/21: 8 steps with single rail step to pattern with min guard assist, progress, just not to goal level   ? Status Partially Met   ? ?  ?  ? ?  ? ? ? ? PT Long Term Goals - 06/22/21 1157   ? ?  ? PT LONG TERM GOAL #1  ? Title Pt and pt's spouse will be independent with final HEP in order to build upon functional gains made in therapy. ALL LTGS DUE 07/08/21   ? Time 8    ? Period Weeks   ?  Status Partially Met   ? Target Date 07/08/21   ?  ? PT LONG TERM GOAL #2  ? Title Pt will perform TUG with SPC vs. no AD in 24 seconds or less in order to demo decr fall risk.   ? Baseline 21.56 sec with SPC on 06/22/21   ? Time 8   ? Period Weeks   ? Status Achieved   ?  ? PT LONG TERM GOAL #3  ? Title Pt will ambulate with SPC 230' with supervision over level indoor surfaces in order to demo improved household mobility.   ? Baseline met on 06/22/21   ? Time 8   ? Period Weeks   ? Status Achieved   ?  ? PT LONG TERM GOAL #4  ? Title Pt will improve gait speed with rollator vs. LRAD to at least 2.4 ft/sec in order to demo decr fall risk.   ? Baseline 1.98 ft/sec with rollator   ? Time 8   ? Period Weeks   ? Status Not Met   ? ?  ?  ? ?  ? ? ? ? ? ? ? ? Plan - 06/22/21 1236   ? ? Clinical Impression Statement Assessed pt's LTGs today with pt meeting LTG #2 and 3, partially met LTG #1, and did not meet LTG #4 in regards to gait speed. Pt able to ambulate with cane with supervision on level surfaces, but does need min guard when scanning environment. Pt uses rollator for primary means of mobility.  Pt will be discharged from PT at this time due to pt undergoing lumbar surgery (PLIF L4-L5) next week. Afterwards, discussed that pt would need to go to an ortho PT clinic vs. neuro for best care. Pt and pt's spouse in agreement with plan.   ? Personal Factors and Comorbidities Comorbidity 1;Time since onset of injury/illness/exacerbation;Past/Current Experience   ? Comorbidities osteoporosis, radiculopathy   ? Examination-Activity Limitations Locomotion Level;Lift;Hygiene/Grooming;Reach Overhead;Transfers;Stand;Stairs;Bathing   ? Examination-Participation Restrictions Community Activity;Driving;Cleaning;Laundry   ? Stability/Clinical Decision Making Evolving/Moderate complexity   ? Rehab Potential Good   ? PT Frequency 2x / week   ? PT Duration 12 weeks   ? PT Treatment/Interventions ADLs/Self Care Home  Management;Aquatic Therapy;Gait training;DME Instruction;Stair training;Functional mobility training;Therapeutic activities;Neuromuscular re-education;Balance training;Therapeutic exercise;Patient/family

## 2021-06-23 DIAGNOSIS — M4316 Spondylolisthesis, lumbar region: Secondary | ICD-10-CM | POA: Diagnosis not present

## 2021-06-28 ENCOUNTER — Inpatient Hospital Stay (HOSPITAL_COMMUNITY): Payer: Medicare Other | Admitting: Certified Registered Nurse Anesthetist

## 2021-06-28 ENCOUNTER — Other Ambulatory Visit: Payer: Self-pay

## 2021-06-28 ENCOUNTER — Encounter (HOSPITAL_COMMUNITY)
Admission: RE | Disposition: A | Discharge status: Home Health | Payer: Self-pay | Source: Home / Self Care | Attending: Neurological Surgery

## 2021-06-28 ENCOUNTER — Inpatient Hospital Stay (HOSPITAL_COMMUNITY): Payer: Medicare Other

## 2021-06-28 ENCOUNTER — Observation Stay (HOSPITAL_COMMUNITY)
Admission: RE | Admit: 2021-06-28 | Discharge: 2021-06-29 | Disposition: A | Discharge status: Home Health | Payer: Medicare Other | Attending: Neurological Surgery | Admitting: Neurological Surgery

## 2021-06-28 ENCOUNTER — Encounter (HOSPITAL_COMMUNITY): Payer: Self-pay | Admitting: Neurological Surgery

## 2021-06-28 DIAGNOSIS — M5416 Radiculopathy, lumbar region: Secondary | ICD-10-CM | POA: Diagnosis not present

## 2021-06-28 DIAGNOSIS — M48061 Spinal stenosis, lumbar region without neurogenic claudication: Secondary | ICD-10-CM

## 2021-06-28 DIAGNOSIS — M4316 Spondylolisthesis, lumbar region: Secondary | ICD-10-CM | POA: Diagnosis not present

## 2021-06-28 DIAGNOSIS — Z981 Arthrodesis status: Principal | ICD-10-CM

## 2021-06-28 DIAGNOSIS — M4326 Fusion of spine, lumbar region: Secondary | ICD-10-CM | POA: Diagnosis not present

## 2021-06-28 HISTORY — PX: BACK SURGERY: SHX140

## 2021-06-28 LAB — ABO/RH: ABO/RH(D): O POS

## 2021-06-28 LAB — SURGICAL PCR SCREEN
MRSA, PCR: NEGATIVE
Staphylococcus aureus: NEGATIVE

## 2021-06-28 SURGERY — POSTERIOR LUMBAR FUSION 1 LEVEL
Anesthesia: General | Site: Back

## 2021-06-28 MED ORDER — ALBUTEROL SULFATE HFA 108 (90 BASE) MCG/ACT IN AERS: 2.0000 | INHALATION_SPRAY | RESPIRATORY_TRACT | Status: DC | PRN

## 2021-06-28 MED ORDER — LACTATED RINGERS IV SOLN: INTRAVENOUS | Status: DC

## 2021-06-28 MED ORDER — FENTANYL CITRATE (PF) 250 MCG/5ML IJ SOLN
INTRAMUSCULAR | Status: DC | PRN
  Administered 2021-06-28 (×6): 50 ug via INTRAVENOUS

## 2021-06-28 MED ORDER — VANCOMYCIN HCL IN DEXTROSE 1-5 GM/200ML-% IV SOLN
1000.0000 mg | Freq: Once | INTRAVENOUS | Status: AC
  Administered 2021-06-28: 1000 mg via INTRAVENOUS
  Filled 2021-06-28: qty 200

## 2021-06-28 MED ORDER — OXYCODONE HCL 5 MG PO TABS
10.0000 mg | ORAL_TABLET | ORAL | Status: DC | PRN
  Administered 2021-06-28 – 2021-06-29 (×4): 10 mg via ORAL
  Filled 2021-06-28 (×4): qty 2

## 2021-06-28 MED ORDER — PHENYLEPHRINE HCL-NACL 20-0.9 MG/250ML-% IV SOLN
INTRAVENOUS | Status: DC | PRN
  Administered 2021-06-28: 25 ug/min via INTRAVENOUS

## 2021-06-28 MED ORDER — ONDANSETRON HCL 4 MG/2ML IJ SOLN
INTRAMUSCULAR | Status: DC | PRN
  Administered 2021-06-28: 4 mg via INTRAVENOUS

## 2021-06-28 MED ORDER — BUPIVACAINE HCL (PF) 0.25 % IJ SOLN
INTRAMUSCULAR | Status: AC
  Filled 2021-06-28: qty 30

## 2021-06-28 MED ORDER — LIDOCAINE 2% (20 MG/ML) 5 ML SYRINGE
INTRAMUSCULAR | Status: DC | PRN
  Administered 2021-06-28: 100 mg via INTRAVENOUS

## 2021-06-28 MED ORDER — METHOCARBAMOL 1000 MG/10ML IJ SOLN
500.0000 mg | Freq: Four times a day (QID) | INTRAVENOUS | Status: DC | PRN
  Filled 2021-06-28: qty 5

## 2021-06-28 MED ORDER — HYDROMORPHONE HCL 1 MG/ML IJ SOLN
INTRAMUSCULAR | Status: AC
  Filled 2021-06-28: qty 1

## 2021-06-28 MED ORDER — OXYCODONE HCL 5 MG PO TABS: 5.0000 mg | ORAL_TABLET | Freq: Once | ORAL | Status: DC | PRN

## 2021-06-28 MED ORDER — ORAL CARE MOUTH RINSE: 15.0000 mL | Freq: Once | OROMUCOSAL | Status: AC

## 2021-06-28 MED ORDER — VANCOMYCIN HCL IN DEXTROSE 1-5 GM/200ML-% IV SOLN
1000.0000 mg | INTRAVENOUS | Status: AC
  Administered 2021-06-28: 1000 mg via INTRAVENOUS
  Filled 2021-06-28: qty 200

## 2021-06-28 MED ORDER — ONDANSETRON HCL 4 MG/2ML IJ SOLN: 4.0000 mg | Freq: Once | INTRAMUSCULAR | Status: DC | PRN

## 2021-06-28 MED ORDER — ACETAMINOPHEN 500 MG PO TABS
1000.0000 mg | ORAL_TABLET | Freq: Four times a day (QID) | ORAL | Status: AC
  Administered 2021-06-28 – 2021-06-29 (×3): 1000 mg via ORAL
  Filled 2021-06-28 (×3): qty 2

## 2021-06-28 MED ORDER — PHENYLEPHRINE 40 MCG/ML (10ML) SYRINGE FOR IV PUSH (FOR BLOOD PRESSURE SUPPORT)
PREFILLED_SYRINGE | INTRAVENOUS | Status: AC
  Filled 2021-06-28: qty 10

## 2021-06-28 MED ORDER — MENTHOL 3 MG MT LOZG
1.0000 | LOZENGE | OROMUCOSAL | Status: DC | PRN
  Filled 2021-06-28: qty 9

## 2021-06-28 MED ORDER — 0.9 % SODIUM CHLORIDE (POUR BTL) OPTIME
TOPICAL | Status: DC | PRN
  Administered 2021-06-28: 1000 mL

## 2021-06-28 MED ORDER — OXYCODONE HCL 5 MG/5ML PO SOLN: 5.0000 mg | Freq: Once | ORAL | Status: DC | PRN

## 2021-06-28 MED ORDER — DEXAMETHASONE SODIUM PHOSPHATE 10 MG/ML IJ SOLN
INTRAMUSCULAR | Status: DC | PRN
Start: 2021-06-28 — End: 2021-06-28
  Administered 2021-06-28: 10 mg via INTRAVENOUS

## 2021-06-28 MED ORDER — CHLORHEXIDINE GLUCONATE CLOTH 2 % EX PADS
6.0000 | MEDICATED_PAD | Freq: Once | CUTANEOUS | Status: DC
Start: 2021-06-28 — End: 2021-06-28

## 2021-06-28 MED ORDER — ONDANSETRON HCL 4 MG PO TABS
4.0000 mg | ORAL_TABLET | Freq: Four times a day (QID) | ORAL | Status: DC | PRN
  Administered 2021-06-29: 4 mg via ORAL
  Filled 2021-06-28: qty 1

## 2021-06-28 MED ORDER — FENTANYL CITRATE (PF) 250 MCG/5ML IJ SOLN
INTRAMUSCULAR | Status: AC
Start: 2021-06-28 — End: ?
  Filled 2021-06-28: qty 5

## 2021-06-28 MED ORDER — GABAPENTIN 300 MG PO CAPS
300.0000 mg | ORAL_CAPSULE | ORAL | Status: AC
  Administered 2021-06-28: 300 mg via ORAL
  Filled 2021-06-28: qty 1

## 2021-06-28 MED ORDER — LACTATED RINGERS IV SOLN: INTRAVENOUS | Status: DC | PRN

## 2021-06-28 MED ORDER — SODIUM CHLORIDE 0.9 % IV SOLN
250.0000 mL | INTRAVENOUS | Status: DC
  Administered 2021-06-28: 250 mL via INTRAVENOUS

## 2021-06-28 MED ORDER — FENTANYL CITRATE (PF) 250 MCG/5ML IJ SOLN
INTRAMUSCULAR | Status: AC
  Filled 2021-06-28: qty 5

## 2021-06-28 MED ORDER — THROMBIN 5000 UNITS EX SOLR
OROMUCOSAL | Status: DC | PRN
  Administered 2021-06-28: 5 mL via TOPICAL

## 2021-06-28 MED ORDER — ONDANSETRON HCL 4 MG/2ML IJ SOLN
INTRAMUSCULAR | Status: AC
  Filled 2021-06-28: qty 2

## 2021-06-28 MED ORDER — EPHEDRINE SULFATE-NACL 50-0.9 MG/10ML-% IV SOSY
PREFILLED_SYRINGE | INTRAVENOUS | Status: DC | PRN
  Administered 2021-06-28: 10 mg via INTRAVENOUS

## 2021-06-28 MED ORDER — ADULT MULTIVITAMIN W/MINERALS CH
1.0000 | ORAL_TABLET | ORAL | Status: DC
  Administered 2021-06-28: 1 via ORAL
  Filled 2021-06-28: qty 1

## 2021-06-28 MED ORDER — ACETAMINOPHEN 500 MG PO TABS
1000.0000 mg | ORAL_TABLET | ORAL | Status: AC
  Administered 2021-06-28: 1000 mg via ORAL
  Filled 2021-06-28: qty 2

## 2021-06-28 MED ORDER — SODIUM CHLORIDE 0.9% FLUSH: 3.0000 mL | INTRAVENOUS | Status: DC | PRN

## 2021-06-28 MED ORDER — SENNA 8.6 MG PO TABS
1.0000 | ORAL_TABLET | Freq: Two times a day (BID) | ORAL | Status: DC
  Administered 2021-06-28: 8.6 mg via ORAL
  Filled 2021-06-28: qty 1

## 2021-06-28 MED ORDER — ONDANSETRON HCL 4 MG/2ML IJ SOLN: 4.0000 mg | Freq: Four times a day (QID) | INTRAMUSCULAR | Status: DC | PRN

## 2021-06-28 MED ORDER — SODIUM CHLORIDE 0.9% FLUSH
3.0000 mL | Freq: Two times a day (BID) | INTRAVENOUS | Status: DC
  Administered 2021-06-28 (×2): 3 mL via INTRAVENOUS

## 2021-06-28 MED ORDER — POTASSIUM CHLORIDE IN NACL 20-0.9 MEQ/L-% IV SOLN: INTRAVENOUS | Status: DC

## 2021-06-28 MED ORDER — CHLORHEXIDINE GLUCONATE CLOTH 2 % EX PADS: 6.0000 | MEDICATED_PAD | Freq: Once | CUTANEOUS | Status: DC

## 2021-06-28 MED ORDER — EPHEDRINE 5 MG/ML INJ
INTRAVENOUS | Status: AC
  Filled 2021-06-28: qty 5

## 2021-06-28 MED ORDER — PHENYLEPHRINE 40 MCG/ML (10ML) SYRINGE FOR IV PUSH (FOR BLOOD PRESSURE SUPPORT)
PREFILLED_SYRINGE | INTRAVENOUS | Status: DC | PRN
Start: 2021-06-28 — End: 2021-06-28
  Administered 2021-06-28: 120 ug via INTRAVENOUS
  Administered 2021-06-28: 80 ug via INTRAVENOUS
  Administered 2021-06-28: 120 ug via INTRAVENOUS

## 2021-06-28 MED ORDER — ALBUTEROL SULFATE (2.5 MG/3ML) 0.083% IN NEBU: 2.5000 mg | INHALATION_SOLUTION | RESPIRATORY_TRACT | Status: DC | PRN

## 2021-06-28 MED ORDER — GABAPENTIN 300 MG PO CAPS
300.0000 mg | ORAL_CAPSULE | Freq: Every day | ORAL | Status: DC
  Administered 2021-06-28: 300 mg via ORAL
  Filled 2021-06-28: qty 1

## 2021-06-28 MED ORDER — K2 PLUS D3 100-1000 MCG-UNIT PO TABS
ORAL_TABLET | Freq: Every morning | ORAL | Status: DC
Start: 2021-06-29 — End: 2021-06-28

## 2021-06-28 MED ORDER — CHLORHEXIDINE GLUCONATE 0.12 % MT SOLN
15.0000 mL | Freq: Once | OROMUCOSAL | Status: AC
  Administered 2021-06-28: 15 mL via OROMUCOSAL
  Filled 2021-06-28: qty 15

## 2021-06-28 MED ORDER — BUPIVACAINE HCL (PF) 0.25 % IJ SOLN
INTRAMUSCULAR | Status: DC | PRN
  Administered 2021-06-28: 8 mL

## 2021-06-28 MED ORDER — THROMBIN 5000 UNITS EX SOLR
CUTANEOUS | Status: AC
  Filled 2021-06-28: qty 5000

## 2021-06-28 MED ORDER — ACETAMINOPHEN 10 MG/ML IV SOLN: 1000.0000 mg | Freq: Once | INTRAVENOUS | Status: DC | PRN

## 2021-06-28 MED ORDER — PROPOFOL 10 MG/ML IV BOLUS
INTRAVENOUS | Status: AC
  Filled 2021-06-28: qty 20

## 2021-06-28 MED ORDER — PROPOFOL 10 MG/ML IV BOLUS
INTRAVENOUS | Status: DC | PRN
  Administered 2021-06-28: 100 mg via INTRAVENOUS

## 2021-06-28 MED ORDER — LIDOCAINE 2% (20 MG/ML) 5 ML SYRINGE
INTRAMUSCULAR | Status: AC
  Filled 2021-06-28: qty 5

## 2021-06-28 MED ORDER — ROCURONIUM BROMIDE 10 MG/ML (PF) SYRINGE
PREFILLED_SYRINGE | INTRAVENOUS | Status: AC
  Filled 2021-06-28: qty 10

## 2021-06-28 MED ORDER — HEMOSTATIC AGENTS (NO CHARGE) OPTIME
TOPICAL | Status: DC | PRN
Start: 2021-06-28 — End: 2021-06-28
  Administered 2021-06-28: 1 via TOPICAL

## 2021-06-28 MED ORDER — ALENDRONATE SODIUM 70 MG PO TABS: 70.0000 mg | ORAL_TABLET | ORAL | Status: DC

## 2021-06-28 MED ORDER — SUGAMMADEX SODIUM 200 MG/2ML IV SOLN
INTRAVENOUS | Status: DC | PRN
Start: 2021-06-28 — End: 2021-06-28
  Administered 2021-06-28: 150 mg via INTRAVENOUS

## 2021-06-28 MED ORDER — HYDROMORPHONE HCL 1 MG/ML IJ SOLN
0.2500 mg | INTRAMUSCULAR | Status: DC | PRN
  Administered 2021-06-28 (×4): 0.25 mg via INTRAVENOUS

## 2021-06-28 MED ORDER — ROCURONIUM BROMIDE 10 MG/ML (PF) SYRINGE
PREFILLED_SYRINGE | INTRAVENOUS | Status: DC | PRN
Start: 2021-06-28 — End: 2021-06-28
  Administered 2021-06-28: 30 mg via INTRAVENOUS
  Administered 2021-06-28: 70 mg via INTRAVENOUS

## 2021-06-28 MED ORDER — PHENOL 1.4 % MT LIQD: 1.0000 | OROMUCOSAL | Status: DC | PRN

## 2021-06-28 MED ORDER — METHOCARBAMOL 500 MG PO TABS
500.0000 mg | ORAL_TABLET | Freq: Four times a day (QID) | ORAL | Status: DC | PRN
  Administered 2021-06-28 (×2): 500 mg via ORAL
  Filled 2021-06-28 (×2): qty 1

## 2021-06-28 MED ORDER — HYDROMORPHONE HCL 1 MG/ML IJ SOLN: 0.5000 mg | INTRAMUSCULAR | Status: DC | PRN

## 2021-06-28 MED ORDER — DEXAMETHASONE SODIUM PHOSPHATE 10 MG/ML IJ SOLN
INTRAMUSCULAR | Status: AC
  Filled 2021-06-28: qty 1

## 2021-06-28 SURGICAL SUPPLY — 66 items
ADH SKN CLS APL DERMABOND .7 (GAUZE/BANDAGES/DRESSINGS) ×1
APL PRP STRL LF DISP 70% ISPRP (MISCELLANEOUS) ×1
APL SKNCLS STERI-STRIP NONHPOA (GAUZE/BANDAGES/DRESSINGS) ×1
BAG COUNTER SPONGE SURGICOUNT (BAG) ×3 IMPLANT
BAG SPNG CNTER NS LX DISP (BAG) ×1
BASKET BONE COLLECTION (BASKET) ×3 IMPLANT
BENZOIN TINCTURE PRP APPL 2/3 (GAUZE/BANDAGES/DRESSINGS) ×3 IMPLANT
BLADE CLIPPER SURG (BLADE) IMPLANT
BUR CARBIDE MATCH 3.0 (BURR) ×3 IMPLANT
CANISTER SUCT 3000ML PPV (MISCELLANEOUS) ×3 IMPLANT
CHLORAPREP W/TINT 26 (MISCELLANEOUS) ×1 IMPLANT
CNTNR URN SCR LID CUP LEK RST (MISCELLANEOUS) ×2 IMPLANT
CONT SPEC 4OZ STRL OR WHT (MISCELLANEOUS) ×2
COVER BACK TABLE 60X90IN (DRAPES) ×3 IMPLANT
DERMABOND ADVANCED (GAUZE/BANDAGES/DRESSINGS) ×1
DERMABOND ADVANCED .7 DNX12 (GAUZE/BANDAGES/DRESSINGS) ×2 IMPLANT
DRAPE C-ARM 42X72 X-RAY (DRAPES) ×8 IMPLANT
DRAPE LAPAROTOMY 100X72X124 (DRAPES) ×3 IMPLANT
DRAPE SURG 17X23 STRL (DRAPES) ×3 IMPLANT
DRSG OPSITE POSTOP 4X6 (GAUZE/BANDAGES/DRESSINGS) ×1 IMPLANT
DURAPREP 26ML APPLICATOR (WOUND CARE) ×2 IMPLANT
ELECT REM PT RETURN 9FT ADLT (ELECTROSURGICAL) ×2
ELECTRODE REM PT RTRN 9FT ADLT (ELECTROSURGICAL) ×2 IMPLANT
EVACUATOR 1/8 PVC DRAIN (DRAIN) ×2 IMPLANT
GAUZE 4X4 16PLY ~~LOC~~+RFID DBL (SPONGE) ×1 IMPLANT
GLOVE SURG ENC MOIS LTX SZ7 (GLOVE) ×2 IMPLANT
GLOVE SURG ENC MOIS LTX SZ8 (GLOVE) ×6 IMPLANT
GLOVE SURG UNDER POLY LF SZ7 (GLOVE) ×2 IMPLANT
GOWN STRL REUS W/ TWL LRG LVL3 (GOWN DISPOSABLE) IMPLANT
GOWN STRL REUS W/ TWL XL LVL3 (GOWN DISPOSABLE) ×4 IMPLANT
GOWN STRL REUS W/TWL 2XL LVL3 (GOWN DISPOSABLE) IMPLANT
GOWN STRL REUS W/TWL LRG LVL3 (GOWN DISPOSABLE) ×6
GOWN STRL REUS W/TWL XL LVL3 (GOWN DISPOSABLE) ×2
GRAFT BONE PROTEIOS LRG 5CC (Orthopedic Implant) ×1 IMPLANT
HEMOSTAT POWDER KIT SURGIFOAM (HEMOSTASIS) ×3 IMPLANT
KIT BASIN OR (CUSTOM PROCEDURE TRAY) ×3 IMPLANT
KIT GRAFTMAG DEL NEURO DISP (NEUROSURGERY SUPPLIES) IMPLANT
KIT TURNOVER KIT B (KITS) ×3 IMPLANT
MATRIX SPINE STRIP NEOCORE 5CC (Putty) IMPLANT
MILL MEDIUM DISP (BLADE) ×3 IMPLANT
NDL HYPO 25X1 1.5 SAFETY (NEEDLE) ×2 IMPLANT
NEEDLE HYPO 25X1 1.5 SAFETY (NEEDLE) ×2 IMPLANT
NS IRRIG 1000ML POUR BTL (IV SOLUTION) ×3 IMPLANT
PACK LAMINECTOMY NEURO (CUSTOM PROCEDURE TRAY) ×3 IMPLANT
PAD ARMBOARD 7.5X6 YLW CONV (MISCELLANEOUS) ×9 IMPLANT
ROD LORD LIPPED TI 5.5X40 (Rod) ×2 IMPLANT
SCREW CORT SHANK MOD 6.5X40 (Screw) ×2 IMPLANT
SCREW KODIAK 6.5X40 (Screw) ×2 IMPLANT
SCREW POLYAXIAL TULIP (Screw) ×2 IMPLANT
SCRUB FOAM CHG 2% SURGICAL (MISCELLANEOUS) ×1 IMPLANT
SET SCREW (Screw) ×8 IMPLANT
SET SCREW SPNE (Screw) IMPLANT
SPACER TRANSCEND 8X9X25 10D (Spacer) ×2 IMPLANT
SPONGE SURGIFOAM ABS GEL 100 (HEMOSTASIS) IMPLANT
SPONGE SURGIFOAM ABS GEL SZ50 (HEMOSTASIS) ×1 IMPLANT
SPONGE T-LAP 4X18 ~~LOC~~+RFID (SPONGE) ×1 IMPLANT
STRIP CLOSURE SKIN 1/2X4 (GAUZE/BANDAGES/DRESSINGS) ×5 IMPLANT
STRIP MATRIX NEOCORE 5CC (Putty) ×1 IMPLANT
SUT VIC AB 0 CT1 18XCR BRD8 (SUTURE) ×2 IMPLANT
SUT VIC AB 0 CT1 8-18 (SUTURE) ×2
SUT VIC AB 2-0 CP2 18 (SUTURE) ×3 IMPLANT
SUT VIC AB 3-0 SH 8-18 (SUTURE) ×6 IMPLANT
TOWEL GREEN STERILE (TOWEL DISPOSABLE) ×3 IMPLANT
TOWEL GREEN STERILE FF (TOWEL DISPOSABLE) ×3 IMPLANT
TRAY FOLEY MTR SLVR 16FR STAT (SET/KITS/TRAYS/PACK) ×3 IMPLANT
WATER STERILE IRR 1000ML POUR (IV SOLUTION) ×3 IMPLANT

## 2021-06-28 NOTE — Anesthesia Procedure Notes (Signed)
Procedure Name: Intubation ?Date/Time: 06/28/2021 9:15 AM ?Performed by: Harden Mo, CRNA ?Pre-anesthesia Checklist: Patient identified, Emergency Drugs available, Suction available and Patient being monitored ?Patient Re-evaluated:Patient Re-evaluated prior to induction ?Oxygen Delivery Method: Circle System Utilized ?Preoxygenation: Pre-oxygenation with 100% oxygen ?Induction Type: IV induction ?Ventilation: Mask ventilation without difficulty and Oral airway inserted - appropriate to patient size ?Laryngoscope Size: Sabra Heck and 2 ?Grade View: Grade I ?Tube type: Oral ?Tube size: 7.0 mm ?Number of attempts: 1 ?Airway Equipment and Method: Stylet and Oral airway ?Placement Confirmation: ETT inserted through vocal cords under direct vision, positive ETCO2 and breath sounds checked- equal and bilateral ?Secured at: 22 cm ?Tube secured with: Tape ?Dental Injury: Teeth and Oropharynx as per pre-operative assessment  ? ? ? ? ?

## 2021-06-28 NOTE — H&P (Signed)
Subjective: ?Patient is a 76 y.o. female admitted for back and leg pain. Onset of symptoms was several months ago, gradually worsening since that time.  The pain is rated severe, and is located at the across the lower back and radiates to RLE. The pain is described as aching and occurs all day. The symptoms have been progressive. Symptoms are exacerbated by exercise, standing, and walking for more than a few minutes. MRI or CT showed spondylolisthesis with stenosis l4-5  ? ?Past Medical History:  ?Diagnosis Date  ? Cholelithiasis   ? rare symptoms, declined surgery  ? Colitis ?1975  ? Hemorrhoids   ? Lichen sclerosus   ? sees gyn for management  ? Mitral valve prolapse   ? Osteoporosis   ? sees gyn  ? Right leg weakness   ? Varicose veins   ?  ?Past Surgical History:  ?Procedure Laterality Date  ? jaw resection    ? RHINOPLASTY    ? TONSILLECTOMY    ? TUBAL LIGATION    ? bilteral  ?  ?Prior to Admission medications   ?Medication Sig Start Date End Date Taking? Authorizing Provider  ?acetaminophen (TYLENOL) 650 MG CR tablet Take 650 mg by mouth every 8 (eight) hours as needed for pain.   Yes [provider]  ?acyclovir ointment (ZOVIRAX) 5 % Apply 1 application. topically every 3 (three) hours as needed (rash).   Yes [provider]  ?alendronate (FOSAMAX) 70 MG tablet Take 70 mg by mouth every Tuesday.   Yes [provider]  ?gabapentin (NEURONTIN) 300 MG capsule Take 300 mg by mouth at bedtime. 10/26/20  Yes [provider]  ?LUTEIN PO Take 1 tablet by mouth once a week.   Yes [provider]  ?methocarbamol (ROBAXIN) 500 MG tablet Take 500-1,000 mg by mouth 2 (two) times daily as needed for muscle spasms.   Yes [provider]  ?Multiple Vitamin (MULTIVITAMIN WITH MINERALS) TABS tablet Take 1 tablet by mouth 3 (three) times a week.   Yes [provider]  ?Omega-3 Fatty Acids (OMEGA 3 PO) Take 15 mLs by mouth in the morning.   Yes [provider]  ?Sodium Chloride-Xylitol (XLEAR SINUS CARE SPRAY NA) Place 1-2 sprays into the nose 3 (three) times daily as needed (congestion.).   Yes [provider]  ?Vitamin D-Vitamin K (K2 PLUS D3 PO) Take 10 drops by mouth in the morning. (Liquid)   Yes [provider]  ?albuterol (VENTOLIN HFA) 108 (90 Base) MCG/ACT inhaler Inhale 2 puffs into the lungs every 4 (four) hours as needed. 07/14/16   Lucretia Kern, DO  ? ?Allergies  ?Allergen Reactions  ? Iodine   ?  Anaphylaxis following IVP dye  ? Penicillins   ?  Has patient had a PCN reaction causing immediate rash, facial/tongue/throat swelling, SOB or lightheadedness with hypotension: Yes ?Has patient had a PCN reaction causing severe rash involving mucus membranes or skin necrosis: No ?Has patient had a PCN reaction that required hospitalization: No ?Has patient had a PCN reaction occurring within the last 10 years: No ?If all of the above answers are "NO", then may proceed with Cephalosporin use. ?  ? Shellfish Allergy Nausea And Vomiting  ?  Patient denies having a shellfish allergy. States she did in the past but no longer has any issues with shellfish.   ? Morphine   ?  Mental status changes  ? Sulfonamide Derivatives   ?  itching  ? Cortisone Itching  ?  Due to eye disorder  ? Doxycycline Hives  ?  Ringing in ears  ? Fluticasone Propionate   ?  ? Reaction-advised not to take per eye doctor  ? Hydrocodone Itching  ? Other Nausea And Vomiting  ?  Potato  ?  ?Social History  ? ?Tobacco Use  ? Smoking status: Never  ? Smokeless tobacco: Never  ?Substance Use Topics  ? Alcohol use: No  ?  Comment: wine-rare  ?  ?Family History  ?Problem Relation Age of Onset  ? Colon cancer Brother 14  ?     died at age 43  ? Pancreatic cancer Brother   ? Atrial fibrillation Mother   ? Alzheimer's disease Father   ? Healthy Sister   ? Healthy Sister   ? Other Sister   ?     immune issues?  ? Breast cancer Sister 36  ? Healthy Brother   ? ?  ?Review of  Systems ? ?Positive ROS: neg ? ?All other systems have been reviewed and were otherwise negative with the exception of those mentioned in the HPI and as above. ? ?Objective: ?Vital signs in last 24 hours: ?Temp:  [97.7 ?F (36.5 ?C)] 97.7 ?F (36.5 ?C) (03/27 3500) ?Pulse Rate:  [84] 84 (03/27 0702) ?Resp:  [17] 17 (03/27 9381) ?BP: (154)/(75) 154/75 (03/27 8299) ?SpO2:  [99 %] 99 % (03/27 0702) ?Weight:  [68 kg] 68 kg (03/27 0702) ? ?General Appearance: Alert, cooperative, no distress, appears stated age ?Head: Normocephalic, without obvious abnormality, atraumatic ?Eyes: PERRL, conjunctiva/corneas clear, EOM's intact    ?Neck: Supple, symmetrical, trachea midline ?Back: Symmetric, no curvature, ROM normal, no CVA tenderness ?Lungs:  respirations unlabored ?Heart: Regular rate and rhythm ?Abdomen: Soft, non-tender ?Extremities: Extremities normal, atraumatic, no cyanosis or edema ?Pulses: 2+ and symmetric all extremities ?Skin: Skin color, texture, turgor normal, no rashes or lesions ? ?NEUROLOGIC:  ? ?Mental status: Alert and oriented x4,  no aphasia, good attention span, fund of knowledge, and memory ?Motor Exam - grossly normal ?Sensory Exam - grossly normal ?Reflexes: 1+ ?Coordination - grossly normal ?Gait - grossly normal ?Balance - grossly normal ?Cranial Nerves: ?I: smell Not tested  ?II: visual acuity  OS: nl    OD: nl  ?II: visual fields Full to confrontation  ?II: pupils Equal, round, reactive to light  ?III,VII: ptosis None  ?III,IV,VI: extraocular muscles  Full ROM  ?V: mastication Normal  ?V: facial light touch sensation  Normal  ?V,VII: corneal reflex  Present  ?VII: facial muscle function - upper  Normal  ?VII: facial muscle function - lower Normal  ?VIII: hearing Not tested  ?IX: soft palate elevation  Normal  ?IX,X: gag reflex Present  ?XI: trapezius strength  5/5  ?XI: sternocleidomastoid strength 5/5  ?XI: neck flexion strength  5/5  ?XII: tongue strength  Normal  ? ? ?Data Review ?Lab Results   ?Component Value Date  ? WBC 5.4 06/22/2021  ? HGB 11.6 (L) 06/22/2021  ? HCT 38.6 06/22/2021  ? MCV 96.5 06/22/2021  ? PLT 259 06/22/2021  ? ?Lab Results  ?Component Value Date  ? NA 140 06/22/2021  ? K 4.1 06/22/2021  ? CL 107 06/22/2021  ? CO2 24 06/22/2021  ? BUN 17 06/22/2021  ? CREATININE 0.88 06/22/2021  ? GLUCOSE 86 06/22/2021  ? ?Lab Results  ?Component Value Date  ? INR 0.9 06/22/2021  ? ? ?Assessment/Plan: ? ?Estimated body mass index is 28.34 kg/m? as calculated from the following: ?  Height as  of this encounter: '5\' 1"'$  (1.549 m). ?  Weight as of this encounter: 68 kg. ?Patient admitted for PLIF L4-5. Patient has failed a reasonable attempt at conservative therapy. ? ?I explained the condition and procedure to the patient and answered any questions.  Patient wishes to proceed with procedure as planned. Understands risks/ benefits and typical outcomes of procedure. ? ? ?Eustace Moore ?06/28/2021 8:58 AM ? ?

## 2021-06-28 NOTE — Op Note (Signed)
06/28/2021 ? ?11:51 AM ? ?PATIENT:  Brandi Villarreal  76 y.o. female ? ?PRE-OPERATIVE DIAGNOSIS: Spondylolisthesis L4-5, spinal stenosis L4-5, back pain, radiculopathy ? ?POST-OPERATIVE DIAGNOSIS:  same ? ?PROCEDURE:   ?1. Decompressive lumbar laminectomy, hemi facetectomy and foraminotomies L4-5 requiring more work than would be required for a simple exposure of the disk for PLIF in order to adequately decompress the neural elements and address the spinal stenosis ?2. Posterior lumbar interbody fusion L4-5 using peek interbody cages packed with morcellized allograft and autograft  ?3. Posterior fixation L4-5 using Alphatec cortical pedicle screws.  ?4. Intertransverse arthrodesis L4-5 using morcellized autograft and allograft. ? ?SURGEON:  Sherley Bounds, MD ? ?ASSISTANTS: Margo Aye FNP ? ?ANESTHESIA:  General ? ?EBL: 200 ml ? ?Total I/O ?In: 1000 [I.V.:1000] ?Out: 725 [Urine:525; Blood:200] ? ?BLOOD ADMINISTERED:none ? ?DRAINS: none  ? ?INDICATION FOR PROCEDURE: This patient presented with back pain with leg pain. Imaging revealed anemic spondylolisthesis L4-5 with spinal stenosis. The patient tried a reasonable attempt at conservative medical measures without relief. I recommended decompression and instrumented fusion to address the stenosis as well as the segmental  instability.  Patient understood the risks, benefits, and alternatives and potential outcomes and wished to proceed. ? ?PROCEDURE DETAILS:  ?The patient was brought to the operating room. After induction of generalized endotracheal anesthesia the patient was rolled into the prone position on chest rolls and all pressure points were padded. The patient's lumbar region was cleaned and then prepped with DuraPrep and draped in the usual sterile fashion. Anesthesia was injected and then a dorsal midline incision was made and carried down to the lumbosacral fascia. The fascia was opened and the paraspinous musculature was taken down in a subperiosteal fashion  to expose L4-5. A self-retaining retractor was placed. Intraoperative fluoroscopy confirmed my level, and I started with placement of the L4 cortical pedicle screws. The pedicle screw entry zones were identified utilizing surface landmarks and  AP and lateral fluoroscopy. I scored the cortex with the high-speed drill and then used the hand drill to drill an upward and outward direction into the pedicle. I then tapped line to line. I then placed a 6.5 x 40 mm cortical pedicle screw into the pedicles of L4 bilaterally.  ? ? I then turned my attention to the decompression and complete lumbar laminectomies, hemi- facetectomies, and foraminotomies were performed at L4-5.  My nurse practitioner was directly involved in the decompression and exposure of the neural elements. the patient had significant spinal stenosis and this required more work than would be required for a simple exposure of the disc for posterior lumbar interbody fusion which would only require a limited laminotomy. Much more generous decompression and generous foraminotomy was undertaken in order to adequately decompress the neural elements and address the patient's leg pain. The yellow ligament was removed to expose the underlying dura and nerve roots, and generous foraminotomies were performed to adequately decompress the neural elements. Both the exiting and traversing nerve roots were decompressed on both sides until a coronary dilator passed easily along the nerve roots. Once the decompression was complete, I turned my attention to the posterior lower lumbar interbody fusion. The epidural venous vasculature was coagulated and cut sharply. Disc space was incised and the initial discectomy was performed with pituitary rongeurs. The disc space was distracted with sequential distractors to a height of 9 mm. We then used a series of scrapers and shavers to prepare the endplates for fusion. The midline was prepared with Epstein curettes. Once  the complete  discectomy was finished, we packed an appropriate sized interbody cage with local autograft and morcellized allograft, gently retracted the nerve root, and tapped the cage into position at L4-5.  The midline between the cages was packed with morselized autograft and allograft.  ? ?We then turned our attention to the placement of the lower pedicle screws. The pedicle screw entry zones were identified utilizing surface landmarks and fluoroscopy. I drilled into each pedicle utilizing the hand drill, and tapped each pedicle with the appropriate tap. We palpated with a ball probe to assure no break in the cortex. We then placed 6.5 x 40 mm cortical pedicle screws into the pedicles bilaterally at L5.  My nurse practitioner assisted in placement of the pedicle screws.  We then decorticated the transverse processes and laid a mixture of morcellized autograft and allograft out over these to perform intertransverse arthrodesis at L4-5. We then placed lordotic rods into the multiaxial screw heads of the pedicle screws and locked these in position with the locking caps and anti-torque device. We then checked our construct with AP and lateral fluoroscopy. Irrigated with copious amounts of bacitracin-containing saline solution. Inspected the nerve roots once again to assure adequate decompression, lined to the dura with Gelfoam,  and then we closed the muscle and the fascia with 0 Vicryl. Closed the subcutaneous tissues with 2-0 Vicryl and subcuticular tissues with 3-0 Vicryl. The skin was closed with benzoin and Steri-Strips. Dressing was then applied, the patient was awakened from general anesthesia and transported to the recovery room in stable condition. At the end of the procedure all sponge, needle and instrument counts were correct.  ? ?PLAN OF CARE: admit to inpatient ? ?PATIENT DISPOSITION:  PACU - hemodynamically stable. ?  ?Delay start of Pharmacological VTE agent (>24hrs) due to surgical blood loss or risk of  bleeding:  yes ? ? ?

## 2021-06-28 NOTE — Anesthesia Preprocedure Evaluation (Signed)
Anesthesia Evaluation  ?Patient identified by MRN, date of birth, ID band ?Patient awake ? ? ? ?Reviewed: ?Allergy & Precautions, NPO status , Patient's Chart, lab work & pertinent test results ? ?Airway ?Mallampati: II ? ?TM Distance: >3 FB ?Neck ROM: Full ? ? ? Dental ?no notable dental hx. ? ?  ?Pulmonary ?neg pulmonary ROS,  ?  ?Pulmonary exam normal ?breath sounds clear to auscultation ? ? ? ? ? ? Cardiovascular ?negative cardio ROS ?Normal cardiovascular exam ?Rhythm:Regular Rate:Normal ? ? ?  ?Neuro/Psych ?negative neurological ROS ? negative psych ROS  ? GI/Hepatic ?negative GI ROS, Neg liver ROS,   ?Endo/Other  ?negative endocrine ROS ? Renal/GU ?negative Renal ROS  ?negative genitourinary ?  ?Musculoskeletal ?negative musculoskeletal ROS ?(+)  ? Abdominal ?  ?Peds ?negative pediatric ROS ?(+)  Hematology ?negative hematology ROS ?(+)   ?Anesthesia Other Findings ? ? Reproductive/Obstetrics ?negative OB ROS ? ?  ? ? ? ? ? ? ? ? ? ? ? ? ? ?  ?  ? ? ? ? ? ? ? ? ?Anesthesia Physical ?Anesthesia Plan ? ?ASA: 2 ? ?Anesthesia Plan: General  ? ?Post-op Pain Management: Dilaudid IV  ? ?Induction: Intravenous ? ?PONV Risk Score and Plan: 3 and Ondansetron, Dexamethasone and Treatment may vary due to age or medical condition ? ?Airway Management Planned: Oral ETT ? ?Additional Equipment:  ? ?Intra-op Plan:  ? ?Post-operative Plan: Extubation in OR ? ?Informed Consent: I have reviewed the patients History and Physical, chart, labs and discussed the procedure including the risks, benefits and alternatives for the proposed anesthesia with the patient or authorized representative who has indicated his/her understanding and acceptance.  ? ? ? ?Dental advisory given ? ?Plan Discussed with: CRNA and Surgeon ? ?Anesthesia Plan Comments:   ? ? ? ? ? ? ?Anesthesia Quick Evaluation ? ?

## 2021-06-28 NOTE — Anesthesia Postprocedure Evaluation (Signed)
Anesthesia Post Note ? ?Patient: Brandi Villarreal ? ?Procedure(s) Performed: Posterior Lumbar Interbody Fusion - Lumbar four-Lumbar five (Back) ? ?  ? ?Patient location during evaluation: PACU ?Anesthesia Type: General ?Level of consciousness: awake and alert ?Pain management: pain level controlled ?Vital Signs Assessment: post-procedure vital signs reviewed and stable ?Respiratory status: spontaneous breathing, nonlabored ventilation, respiratory function stable and patient connected to nasal cannula oxygen ?Cardiovascular status: blood pressure returned to baseline and stable ?Postop Assessment: no apparent nausea or vomiting ?Anesthetic complications: no ? ? ?No notable events documented. ? ?Last Vitals:  ?Vitals:  ? 06/28/21 1245 06/28/21 1300  ?BP: 108/69 92/69  ?Pulse: 67 74  ?Resp: 13 20  ?Temp:    ?SpO2: 100% 100%  ?  ?Last Pain:  ?Vitals:  ? 06/28/21 1300  ?TempSrc:   ?PainSc: 5   ? ? ?  ?  ?  ?  ?  ?  ? ?[name] ? ? ? ? ?

## 2021-06-28 NOTE — Transfer of Care (Signed)
Immediate Anesthesia Transfer of Care Note ? ?Patient: Brandi Villarreal ? ?Procedure(s) Performed: Posterior Lumbar Interbody Fusion - Lumbar four-Lumbar five (Back) ? ?Patient Location: PACU ? ?Anesthesia Type:General ? ?Level of Consciousness: awake and drowsy ? ?Airway & Oxygen Therapy: Patient Spontanous Breathing and Patient connected to nasal cannula oxygen ? ?Post-op Assessment: Report given to RN, Post -op Vital signs reviewed and stable and Patient moving all extremities X 4 ? ?Post vital signs: Reviewed and stable ? ?Last Vitals:  ?Vitals Value Taken Time  ?BP 100/56 (70) 06/28/21 1214  ?Temp    ?Pulse 85 06/28/21 1215  ?Resp 25 06/28/21 1215  ?SpO2 99 % 06/28/21 1215  ?Vitals shown include unvalidated device data. ? ?Last Pain:  ?Vitals:  ? 06/28/21 0715  ?TempSrc:   ?PainSc: 2   ?   ? ?Patients Stated Pain Goal: 0 (06/28/21 0715) ? ?Complications: No notable events documented. ?

## 2021-06-29 ENCOUNTER — Ambulatory Visit: Payer: Medicare Other | Admitting: Physical Therapy

## 2021-06-29 DIAGNOSIS — M48061 Spinal stenosis, lumbar region without neurogenic claudication: Secondary | ICD-10-CM | POA: Diagnosis not present

## 2021-06-29 DIAGNOSIS — M4316 Spondylolisthesis, lumbar region: Secondary | ICD-10-CM | POA: Diagnosis not present

## 2021-06-29 DIAGNOSIS — M5416 Radiculopathy, lumbar region: Secondary | ICD-10-CM | POA: Diagnosis not present

## 2021-06-29 MED ORDER — OXYCODONE-ACETAMINOPHEN 5-325 MG PO TABS: 1.0000 | ORAL_TABLET | ORAL | 0 refills | Status: DC | PRN

## 2021-06-29 MED ORDER — METHOCARBAMOL 500 MG PO TABS: 500.0000 mg | ORAL_TABLET | Freq: Two times a day (BID) | ORAL | 0 refills | Status: DC | PRN

## 2021-06-29 NOTE — Discharge Instructions (Signed)
Wound Care ?Keep incision covered and dry for three days.   ?Do not put any creams, lotions, or ointments on incision. ?Leave steri-strips on back.  They will fall off by themselves. ?Activity ?Walk each and every day, increasing distance each day. ?No lifting greater than 5 lbs.  Avoid excessive back motion. ?No driving for 2 weeks; may ride as a passenger locally. ?If provided with back brace, wear when out of bed.  It is not necessary to wear brace in bed. ?Diet ?Resume your normal diet.  ? ?Call Your Doctor If Any of These Occur ?Redness, drainage, or swelling at the wound.  ?Temperature greater than 101 degrees. ?Severe pain not relieved by pain medication. ?Incision starts to come apart. ?Follow Up Appt ?Call today for appointment in 1-2 weeks ([phone]) or for problems.  If you have any hardware placed in your spine, you will need an x-ray before your appointment.  ?

## 2021-06-29 NOTE — Progress Notes (Signed)
Physical Therapy Treatment ?Patient Details ?Name: Brandi Villarreal ?MRN: [id number] ?DOB: 1945-07-25 ?Today's Date: 06/29/2021 ? ? ?History of Present Illness Pt is a 76 y/o female who presents s/p PLIF L4-5 PMH R leg weakness, rhinoplasty, jaw resection, osteoporosis ? ?  ?PT Comments  ? ? Pt admitted with above diagnosis. At the time of PT eval, pt was able to demonstrate transfers and ambulation with gross min guard assist and RW for support. Pt was educated on precautions, brace application/wearing schedule, appropriate activity progression, and car transfer. Pt currently with functional limitations due to the deficits listed below (see PT Problem List). Pt will benefit from skilled PT to increase their independence and safety with mobility to allow discharge to the venue listed below.    ?  ?Recommendations for follow up therapy are one component of a multi-disciplinary discharge planning process, led by the attending physician.  Recommendations may be updated based on patient status, additional functional criteria and insurance authorization. ? ?Follow Up Recommendations ? Home health PT ?  ?  ?Assistance Recommended at Discharge Frequent or constant Supervision/Assistance  ?Patient can return home with the following A little help with walking and/or transfers;A little help with bathing/dressing/bathroom;Assistance with cooking/housework;Assist for transportation;Help with stairs or ramp for entrance ?  ?Equipment Recommendations ? None recommended by PT  ?  ?Recommendations for Other Services   ? ? ?  ?Precautions / Restrictions Precautions ?Precautions: Back ?Precaution Booklet Issued: Yes (comment) ?Required Braces or Orthoses: Spinal Brace ?Spinal Brace: Lumbar corset;Applied in sitting position ?Restrictions ?Weight Bearing Restrictions: No  ?  ? ?Mobility ? Bed Mobility ?  ?  ?  ?  ?  ?  ?  ?General bed mobility comments: Reviewed log roll verbally with pt and husband. Pt was sitting in straight back  visitor chair when PT arrived. ?  ? ?Transfers ?Overall transfer level: Needs assistance ?Equipment used: Rolling walker (2 wheels) ?Transfers: Sit to/from Stand ?Sit to Stand: Min guard ?  ?  ?  ?  ?  ?General transfer comment: Hands on guarding to power up to full standing position. Multimodal cues for hand placement on seated surface for safety. ?  ? ?Ambulation/Gait ?Ambulation/Gait assistance: Min guard, Min assist ?Gait Distance (Feet): 175 Feet ?Assistive device: Rolling walker (2 wheels) ?Gait Pattern/deviations: Step-through pattern, Decreased stride length, Trunk flexed, Drifts right/left ?Gait velocity: Decreased ?Gait velocity interpretation: <1.31 ft/sec, indicative of household ambulator ?  ?General Gait Details: VC's for improved posture, closer walker proximity, and forward gaze. Occasional assist for walker management. Pt reports nausea after stair training and during ambulation back to the room. ? ? ?Stairs ?Stairs: Yes ?Stairs assistance: Min assist ?Stair Management: One rail Right, Step to pattern, Sideways, Forwards ?Number of Stairs: 5 (x2) ?General stair comments: Pt practiced negotiating stairs facing forwards with railing and HHA, as well as sideways with hands on assist. Pt with difficulty advancing LE's to the next step but overall was able to manage with min A. ? ? ?Wheelchair Mobility ?  ? ?Modified Rankin (Stroke Patients Only) ?  ? ? ?  ?Balance Overall balance assessment: Mild deficits observed, not formally tested ?  ?  ?  ?  ?  ?  ?  ?  ?  ?  ?  ?  ?  ?  ?  ?  ?  ?  ?  ? ?  ?Cognition Arousal/Alertness: Lethargic ?Behavior During Therapy: Flat affect ?Overall Cognitive Status: Impaired/Different from baseline ?Area of Impairment: Attention, Memory ?  ?  ?  ?  ?  ?  ?  ?  ?  ?  Current Attention Level: Sustained ?Memory: Decreased short-term memory, Decreased recall of precautions ?  ?  ?  ?  ?  ?  ?  ? ?  ?Exercises   ? ?  ?General Comments   ?  ?  ? ?Pertinent Vitals/Pain Pain  Assessment ?Pain Assessment: Faces ?Faces Pain Scale: Hurts even more ?Pain Location: back ?Pain Descriptors / Indicators: Discomfort, Grimacing ?Pain Intervention(s): Limited activity within patient's tolerance, Monitored during session, Repositioned  ? ? ?Home Living Family/patient expects to be discharged to:: Private residence ?Living Arrangements: Spouse/significant other ?Available Help at Discharge: Family;Available 24 hours/day ?Type of Home: House ?Home Access: Stairs to enter ?Entrance Stairs-Rails: Right ?Entrance Stairs-Number of Steps: 5 steps in the back and 2 in the front. normally go in back ?Alternate Level Stairs-Number of Steps: 15 ?Home Layout: Two level;Able to live on main level with bedroom/bathroom;1/2 bath on main level (full bath upstairs and showers 1 x per week) ?Home Equipment: Conservation officer, nature (2 wheels);Rollator (4 wheels);Cane - single point;Shower seat;Wheelchair - Psychologist, educational ?Additional Comments: sits in recliner at home but has to push the foot rest to recliner down. pt has spouse (A) for LB dressing at baseline  ?  ?Prior Function    ?  ?  ?   ? ?PT Goals (current goals can now be found in the care plan section) Acute Rehab PT Goals ?Patient Stated Goal: Home today ?PT Goal Formulation: With patient/family ?Time For Goal Achievement: 07/06/21 ?Potential to Achieve Goals: Good ? ?  ?Frequency ? ? ? Min 5X/week ? ? ? ?  ?PT Plan    ? ? ?Co-evaluation   ?  ?  ?  ?  ? ?  ?AM-PAC PT "6 Clicks" Mobility   ?Outcome Measure ? Help needed turning from your back to your side while in a flat bed without using bedrails?: A Little ?Help needed moving from lying on your back to sitting on the side of a flat bed without using bedrails?: A Little ?Help needed moving to and from a bed to a chair (including a wheelchair)?: A Little ?Help needed standing up from a chair using your arms (e.g., wheelchair or bedside chair)?: A Little ?Help needed to walk in hospital room?: A Little ?Help  needed climbing 3-5 steps with a railing? : A Little ?6 Click Score: 18 ? ?  ?End of Session Equipment Utilized During Treatment: Gait belt;Back brace ?Activity Tolerance: Patient tolerated treatment well ?Patient left: in chair;with call bell/phone within reach;with family/visitor present ?Nurse Communication: Mobility status ?PT Visit Diagnosis: Unsteadiness on feet (R26.81);Pain ?Pain - part of body:  (Back) ?  ? ? ?Time: 3646-8032 ?PT Time Calculation (min) (ACUTE ONLY): 27 min ? ?Charges:  $Gait Training: 8-22 mins          ?          ? ?Rolinda Roan, PT, DPT ?Acute Rehabilitation Services ?Pager: [phone] ?Office: [phone]  ? ? ?Thelma Comp ?06/29/2021, 3:26 PM ? ?

## 2021-06-29 NOTE — Discharge Summary (Signed)
Physician Discharge Summary  ?Patient ID: ?Brandi Villarreal ?MRN: [id number] ?DOB/AGE: 05/14/1945 76 y.o. ? ?Admit date: 06/28/2021 ?Discharge date: 06/29/2021 ? ?Admission Diagnoses: Spondylolisthesis L4-5, spinal stenosis L4-5, back pain, radiculopathy ?  ? ? ?Discharge Diagnoses: same ? ? ?Discharged Condition: good ? ?Hospital Course: The patient was admitted on 06/28/2021 and taken to the operating room where the patient underwent plif L4-5. The patient tolerated the procedure well and was taken to the recovery room and then to the floor in stable condition. The hospital course was routine. There were no complications. The wound remained clean dry and intact. Pt had appropriate back soreness. No complaints of leg pain or new N/T/W. The patient remained afebrile with stable vital signs, and tolerated a regular diet. The patient continued to increase activities, and pain was well controlled with oral pain medications.  ? ?Consults: None ? ?Significant Diagnostic Studies:  ?Results for orders placed or performed during the hospital encounter of 06/28/21  ?Surgical PCR Screen  ? Specimen: Nasal Mucosa; Nasal Swab  ?Result Value Ref Range  ? MRSA, PCR NEGATIVE NEGATIVE  ? Staphylococcus aureus NEGATIVE NEGATIVE  ?ABO/Rh  ?Result Value Ref Range  ? ABO/RH(D)    ?  O POS ?Performed at [facility], [place] [address], [place], [place] [address] ?  ? ? ?DG Lumbar Spine 2-3 Views ? ?Result Date: 06/28/2021 ?CLINICAL DATA:  Posterior lumbar fusion EXAM: LUMBAR SPINE - 2 VIEW COMPARISON:  None. FINDINGS: Fluoroscopic images were obtained intraoperatively and submitted for post operative interpretation. Intraop imaging during L4-L5 fusion with interbody spacer, 2 images were obtained with 54 seconds of fluoroscopy time and 41.04 mGy. Please see the performing provider's procedural report for further detail. IMPRESSION: Intraop imaging during L4-L5 fusion. Electronically Signed   By: Yetta Glassman M.D.   On: 06/28/2021 12:06   ? ?DG C-Arm 1-60 Min-No Report ? ?Result Date: 06/28/2021 ?Fluoroscopy was utilized by the requesting physician.  No radiographic interpretation.  ? ?DG C-Arm 1-60 Min-No Report ? ?Result Date: 06/28/2021 ?Fluoroscopy was utilized by the requesting physician.  No radiographic interpretation.   ? ?Antibiotics:  ?Anti-infectives (From admission, onward)  ? ? Start     Dose/Rate Route Frequency Ordered Stop  ? 06/28/21 2000  vancomycin (VANCOCIN) IVPB 1000 mg/200 mL premix       ? 1,000 mg ?200 mL/hr over 60 Minutes Intravenous  Once 06/28/21 1404 06/28/21 2031  ? 06/28/21 0715  vancomycin (VANCOCIN) IVPB 1000 mg/200 mL premix       ? 1,000 mg ?200 mL/hr over 60 Minutes Intravenous On call to O.R. 06/28/21 2505 06/28/21 0905  ? ?  ? ? ?Discharge Exam: ?Blood pressure 127/72, pulse 91, temperature 97.6 ?F (36.4 ?C), temperature source Oral, resp. rate 18, height '5\' 1"'$  (1.549 m), weight 68 kg, SpO2 98 %. ?Neurologic: Grossly normal ?Ambulating and voiding well incision cdi  ? ?Discharge Medications:   ?Allergies as of 06/29/2021   ? ?   Reactions  ? Iodine   ? Anaphylaxis following IVP dye  ? Penicillins   ? Has patient had a PCN reaction causing immediate rash, facial/tongue/throat swelling, SOB or lightheadedness with hypotension: Yes ?Has patient had a PCN reaction causing severe rash involving mucus membranes or skin necrosis: No ?Has patient had a PCN reaction that required hospitalization: No ?Has patient had a PCN reaction occurring within the last 10 years: No ?If all of the above answers are "NO", then may proceed with Cephalosporin use.  ? Shellfish Allergy  Nausea And Vomiting  ? Patient denies having a shellfish allergy. States she did in the past but no longer has any issues with shellfish.   ? Morphine   ? Mental status changes  ? Sulfonamide Derivatives   ? itching  ? Cortisone Itching  ? Due to eye disorder  ? Doxycycline Hives  ? Ringing in ears  ? Fluticasone Propionate   ? ? Reaction-advised not to take  per eye doctor  ? Hydrocodone Itching  ? Other Nausea And Vomiting  ? Potato  ? ?  ? ?  ?Medication List  ?  ? ?TAKE these medications   ? ?acetaminophen 650 MG CR tablet ?Commonly known as: TYLENOL ?Take 650 mg by mouth every 8 (eight) hours as needed for pain. ?  ?acyclovir ointment 5 % ?Commonly known as: ZOVIRAX ?Apply 1 application. topically every 3 (three) hours as needed (rash). ?  ?albuterol 108 (90 Base) MCG/ACT inhaler ?Commonly known as: Ventolin HFA ?Inhale 2 puffs into the lungs every 4 (four) hours as needed. ?  ?alendronate 70 MG tablet ?Commonly known as: FOSAMAX ?Take 70 mg by mouth every Tuesday. ?  ?gabapentin 300 MG capsule ?Commonly known as: NEURONTIN ?Take 300 mg by mouth at bedtime. ?  ?K2 PLUS D3 PO ?Take 10 drops by mouth in the morning. (Liquid) ?  ?LUTEIN PO ?Take 1 tablet by mouth once a week. ?  ?methocarbamol 500 MG tablet ?Commonly known as: ROBAXIN ?Take 1-2 tablets (500-1,000 mg total) by mouth 2 (two) times daily as needed for muscle spasms. ?  ?multivitamin with minerals Tabs tablet ?Take 1 tablet by mouth 3 (three) times a week. ?  ?OMEGA 3 PO ?Take 15 mLs by mouth in the morning. ?  ?oxyCODONE-acetaminophen 5-325 MG tablet ?Commonly known as: Percocet ?Take 1 tablet by mouth every 4 (four) hours as needed for severe pain. ?  ?XLEAR SINUS CARE SPRAY NA ?Place 1-2 sprays into the nose 3 (three) times daily as needed (congestion.). ?  ? ?  ? ?  ?  ? ? ?  ?Durable Medical Equipment  ?(From admission, onward)  ?  ? ? ?  ? ?  Start     Ordered  ? 06/28/21 1404  DME Walker rolling  Once       ?Question:  Patient needs a walker to treat with the following condition  Answer:  S/P lumbar fusion  ? 06/28/21 1403  ? 06/28/21 1404  DME 3 n 1  Once       ? 06/28/21 1403  ? ?  ?  ? ?  ? ? ?Disposition: home  ? ?Final Dx: PLIF L4-5 ? ?Discharge Instructions   ? ?  Remove dressing in 72 hours   Complete by: As directed ?  ? Diet - low sodium heart healthy   Complete by: As directed ?  ? Driving  Restrictions   Complete by: As directed ?  ? No driving for 2 weeks, no riding in the car for 1 week  ? Increase activity slowly   Complete by: As directed ?  ? Lifting restrictions   Complete by: As directed ?  ? No lifting more than 8 lbs  ? ?  ? ? ? ? ? ?Signed: ?Ocie Cornfield Reis Goga ?06/29/2021, 7:49 AM ?  ?

## 2021-06-29 NOTE — Evaluation (Signed)
Occupational Therapy Evaluation ?Patient Details ?Name: Brandi Villarreal ?MRN: [id number] ?DOB: [birth year] ?Today's Date: 06/29/2021 ? ? ?History of Present Illness 76 yo female PLIF L4-5 PMH R leg weakness, rhinoplasty, jaw resection, osteoporosis  ? ?Clinical Impression ?  ?Patient is s/p PLIF L4-5 surgery resulting in functional limitations due to the deficits listed below (see OT problem list). Pt currently very groggy and needs multiple cues to recall information. Pt reports poor sleep. Pt has spouse present to demo all education. Recommedation for bSC due to half bath only on main level with low commodes.  Patient will benefit from skilled OT acutely to increase independence and safety with ADLS to allow discharge [place]. ?  ?   ? ?Recommendations for follow up therapy are one component of a multi-disciplinary discharge planning process, led by the attending physician.  Recommendations may be updated based on patient status, additional functional criteria and insurance authorization.  ? ?Follow Up Recommendations ? Home health OT  ?  ?Assistance Recommended at Discharge Set up Supervision/Assistance  ?Patient can return home with the following   ? ?  ?Functional Status Assessment ? Patient has had a recent decline in their functional status and demonstrates the ability to make significant improvements in function in a reasonable and predictable amount of time.  ?Equipment Recommendations ? BSC/3in1  ?  ?Recommendations for Other Services   ? ? ?  ?Precautions / Restrictions Precautions ?Precautions: Back ?Precaution Booklet Issued: Yes (comment) ?Required Braces or Orthoses: Spinal Brace ?Spinal Brace: Lumbar corset;Applied in sitting position  ? ?  ? ?Mobility Bed Mobility ?Overal bed mobility: Needs Assistance ?Bed Mobility: Supine to Sit, Sit to Supine ?  ?  ?Supine to sit: Mod assist ?Sit to supine: Mod assist ?  ?General bed mobility comments: educated with return demo from spouse ?  ? ?Transfers ?Overall  transfer level: Needs assistance ?Equipment used: Rolling walker (2 wheels) ?Transfers: Sit to/from Stand ?Sit to Stand: Min guard ?  ?  ?  ?  ?  ?  ?  ? ?  ?Balance Overall balance assessment: Mild deficits observed, not formally tested ?  ?  ?  ?  ?  ?  ?  ?  ?  ?  ?  ?  ?  ?  ?  ?  ?  ?  ?   ? ?ADL either performed or assessed with clinical judgement  ? ?ADL Overall ADL's : Needs assistance/impaired ?Eating/Feeding: Supervision/ safety ?  ?Grooming: Supervision/safety ?  ?Upper Body Bathing: Moderate assistance ?  ?Lower Body Bathing: Moderate assistance ?  ?Upper Body Dressing : Moderate assistance ?Upper Body Dressing Details (indicate cue type and reason): educated  on don doff brace and covering skin ?Lower Body Dressing: Moderate assistance ?  ?Toilet Transfer: Minimal assistance;Rolling walker (2 wheels);BSC/3in1 ?Toilet Transfer Details (indicate cue type and reason): educated on use of BSC at home and bathroom ?Toileting- Water quality scientist and Hygiene: Min guard ?  ?  ?  ?Functional mobility during ADLs: Minimal assistance;Rolling walker (2 wheels) ?General ADL Comments: spouse present and voice recording session for recall  ? ? ? ?Vision Baseline Vision/History: 1 Wears glasses ?Additional Comments: reading only  ?   ?Perception   ?  ?Praxis   ?  ? ?Pertinent Vitals/Pain Pain Assessment ?Pain Assessment: 0-10 ?Pain Score: 4  ?Pain Location: back ?Pain Descriptors / Indicators: Discomfort, Grimacing ?Pain Intervention(s): Monitored during session, Premedicated before session, Repositioned  ? ? ? ?Hand Dominance Right ?  ?Extremity/Trunk Assessment Upper Extremity  Assessment ?Upper Extremity Assessment: Overall WFL for tasks assessed ?  ?Lower Extremity Assessment ?Lower Extremity Assessment: Defer to PT evaluation ?  ?Cervical / Trunk Assessment ?Cervical / Trunk Assessment: Back Surgery ?  ?Communication Communication ?Communication: No difficulties ?  ?Cognition Arousal/Alertness:  Lethargic ?Behavior During Therapy: Flat affect ?Overall Cognitive Status: Impaired/Different from baseline ?Area of Impairment: Attention, Memory ?  ?  ?  ?  ?  ?  ?  ?  ?  ?Current Attention Level: Sustained ?Memory: Decreased short-term memory ?  ?  ?  ?  ?General Comments: pt with decreased recall of 2 step commands. pt requires step by step education and cues to use the spirometer ?  ?  ?General Comments  incsision is covered and dry at this time ? ?  ?Exercises   ?  ?Shoulder Instructions    ? ? ?Home Living Family/patient expects to be discharged to:: Private residence ?Living Arrangements: Spouse/significant other ?Available Help at Discharge: Family;Available 24 hours/day ?Type of Home: House ?Home Access: Stairs to enter ?Entrance Stairs-Number of Steps: 5 steps in the back and 2 in the front. normally go in back ?Entrance Stairs-Rails: Right ?Home Layout: Two level;Able to live on main level with bedroom/bathroom;1/2 bath on main level (full bath upstairs and showers 1 x per week) ?Alternate Level Stairs-Number of Steps: 15 ?Alternate Level Stairs-Rails: Left ?Bathroom Shower/Tub: Walk-in shower ?  ?Bathroom Toilet: Standard ?  ?  ?Home Equipment: Conservation officer, nature (2 wheels);Rollator (4 wheels);Cane - single point;Shower seat;Wheelchair - Psychologist, educational ?Adaptive Equipment: Reacher ?Additional Comments: sits in recliner at home but has to push the foot rest to recliner down. pt has spouse (A) for LB dressing at baseline ?  ? ?  ?Prior Functioning/Environment Prior Level of Function : Needs assist ?  ?  ?  ?Physical Assist : ADLs (physical);Mobility (physical) ?Mobility (physical): Bed mobility;Transfers;Stairs ?ADLs (physical): Dressing;Bathing ?Mobility Comments: uses rollator ?ADLs Comments: requires (A) for LB drssing ?  ? ?  ?  ?OT Problem List: Decreased strength;Decreased activity tolerance;Impaired balance (sitting and/or standing);Decreased cognition;Decreased safety awareness;Decreased  knowledge of use of DME or AE;Decreased knowledge of precautions;Pain ?  ?   ?OT Treatment/Interventions: Self-care/ADL training;Therapeutic exercise;Energy conservation;DME and/or AE instruction;Therapeutic activities;Cognitive remediation/compensation;Patient/family education;Balance training  ?  ?OT Goals(Current goals can be found in the care plan section) Acute Rehab OT Goals ?Patient Stated Goal: to go home and sleep ?OT Goal Formulation: With patient/family ?Time For Goal Achievement: 07/13/21 ?Potential to Achieve Goals: Good  ?OT Frequency: Min 2X/week ?  ? ?Co-evaluation   ?  ?  ?  ?  ? ?  ?AM-PAC OT "6 Clicks" Daily Activity     ?Outcome Measure Help from another person eating meals?: A Little ?Help from another person taking care of personal grooming?: A Little ?Help from another person toileting, which includes using toliet, bedpan, or urinal?: A Little ?Help from another person bathing (including washing, rinsing, drying)?: A Little ?Help from another person to put on and taking off regular upper body clothing?: A Little ?Help from another person to put on and taking off regular lower body clothing?: A Lot ?6 Click Score: 17 ?  ?End of Session Equipment Utilized During Treatment: Rolling walker (2 wheels);Back brace ?Nurse Communication: Mobility status;Precautions ? ?Activity Tolerance: Patient tolerated treatment well ?Patient left: in bed;with call bell/phone within reach;with family/visitor present ? ?OT Visit Diagnosis: Unsteadiness on feet (R26.81);Muscle weakness (generalized) (M62.81)  ?              ?  Time: 6431-4276 ?OT Time Calculation (min): 45 min ?Charges:  OT General Charges ?$OT Visit: 1 Visit ?OT Evaluation ?$OT Eval Moderate Complexity: 1 Mod ?OT Treatments ?$Self Care/Home Management : 23-37 mins ? ? ?Brynn, OTR/L  ?Acute Rehabilitation Services ?Pager: [phone] ?Office: [phone] ?. ? ? ?Jeri Modena ?06/29/2021, 9:06 AM ?

## 2021-06-29 NOTE — TOC Progression Note (Addendum)
Transition of Care (TOC) - Progression Note  ? ? ?Patient Details  ?Name: Roseanna Koplin Tallo ?MRN: [id number] ?Date of Birth: 1946-01-02 ? ?Transition of Care (TOC) CM/SW Contact  ?Marilu Favre, RN ?Phone Number: ?06/29/2021, 9:05 AM ? ?Clinical Narrative:    ? ?Spoke to patient and husband at bedside .  ?Confirmed face sheet information .  ? ?Discussed home health, patient and husband in agreement . No preference.  ? ?Called Amy with [facility] awaiting call back.  ? ?3C staff will provide any needed DME. ? ?Amy with [facility] accepted referral for home health, will need orders and face to face  ? ?Expected Discharge Plan: [place] ?  ? ?Expected Discharge Plan and Services ?Expected Discharge Plan: [place] ?  ?Discharge Planning Services: CM Consult ?Post Acute Care Choice: Home Health ?Living arrangements for the past 2 months: [place] ?Expected Discharge Date: 06/29/21               ?  ?  ?  ?  ?  ?HH Arranged: PT, OT ?  ?  ?  ?  ? ? ?Social Determinants of Health (SDOH) Interventions ?  ? ?Readmission Risk Interventions ?   ? View : No data to display.  ?  ?  ?  ? ? ?

## 2021-07-01 ENCOUNTER — Encounter (HOSPITAL_COMMUNITY): Payer: Self-pay | Admitting: Neurological Surgery

## 2021-07-02 ENCOUNTER — Telehealth: Payer: Self-pay | Admitting: Family Medicine

## 2021-07-02 DIAGNOSIS — R2681 Unsteadiness on feet: Secondary | ICD-10-CM | POA: Diagnosis not present

## 2021-07-02 DIAGNOSIS — Z4789 Encounter for other orthopedic aftercare: Secondary | ICD-10-CM | POA: Diagnosis not present

## 2021-07-02 DIAGNOSIS — M810 Age-related osteoporosis without current pathological fracture: Secondary | ICD-10-CM | POA: Diagnosis not present

## 2021-07-02 DIAGNOSIS — M4326 Fusion of spine, lumbar region: Secondary | ICD-10-CM | POA: Diagnosis not present

## 2021-07-02 NOTE — Telephone Encounter (Signed)
Charri with [facility] is calling requesting verbal orders. The verbal orders are for PT. The verbal orders are 1 week 1, 3 week 1, and 2 week 1. ? ?Jerl Mina could be contacted at [phone]. ? ?Please advise. ?

## 2021-07-03 NOTE — Telephone Encounter (Signed)
[place] for orders. Fyi patient hasn't been seen in nearly 2 years. Will need to get established with new pcp once she is physically able. ?

## 2021-07-05 NOTE — Telephone Encounter (Signed)
Left a detailed message on Brandi Villarreal's voicemail with the approval for orders and message as below.  ?

## 2021-07-06 ENCOUNTER — Ambulatory Visit: Payer: Medicare Other | Admitting: Physical Therapy

## 2021-07-06 DIAGNOSIS — M810 Age-related osteoporosis without current pathological fracture: Secondary | ICD-10-CM | POA: Diagnosis not present

## 2021-07-06 DIAGNOSIS — Z4789 Encounter for other orthopedic aftercare: Secondary | ICD-10-CM | POA: Diagnosis not present

## 2021-07-06 DIAGNOSIS — M4326 Fusion of spine, lumbar region: Secondary | ICD-10-CM | POA: Diagnosis not present

## 2021-07-06 DIAGNOSIS — R2681 Unsteadiness on feet: Secondary | ICD-10-CM | POA: Diagnosis not present

## 2021-07-08 DIAGNOSIS — R2681 Unsteadiness on feet: Secondary | ICD-10-CM | POA: Diagnosis not present

## 2021-07-08 DIAGNOSIS — M4326 Fusion of spine, lumbar region: Secondary | ICD-10-CM | POA: Diagnosis not present

## 2021-07-08 DIAGNOSIS — M810 Age-related osteoporosis without current pathological fracture: Secondary | ICD-10-CM | POA: Diagnosis not present

## 2021-07-08 DIAGNOSIS — Z4789 Encounter for other orthopedic aftercare: Secondary | ICD-10-CM | POA: Diagnosis not present

## 2021-07-09 DIAGNOSIS — R2681 Unsteadiness on feet: Secondary | ICD-10-CM | POA: Diagnosis not present

## 2021-07-09 DIAGNOSIS — Z4789 Encounter for other orthopedic aftercare: Secondary | ICD-10-CM | POA: Diagnosis not present

## 2021-07-09 DIAGNOSIS — M810 Age-related osteoporosis without current pathological fracture: Secondary | ICD-10-CM | POA: Diagnosis not present

## 2021-07-09 DIAGNOSIS — M4326 Fusion of spine, lumbar region: Secondary | ICD-10-CM | POA: Diagnosis not present

## 2021-07-14 DIAGNOSIS — M4326 Fusion of spine, lumbar region: Secondary | ICD-10-CM | POA: Diagnosis not present

## 2021-07-14 DIAGNOSIS — M810 Age-related osteoporosis without current pathological fracture: Secondary | ICD-10-CM | POA: Diagnosis not present

## 2021-07-14 DIAGNOSIS — R2681 Unsteadiness on feet: Secondary | ICD-10-CM | POA: Diagnosis not present

## 2021-07-14 DIAGNOSIS — Z4789 Encounter for other orthopedic aftercare: Secondary | ICD-10-CM | POA: Diagnosis not present

## 2021-07-16 DIAGNOSIS — Z4789 Encounter for other orthopedic aftercare: Secondary | ICD-10-CM | POA: Diagnosis not present

## 2021-07-16 DIAGNOSIS — R2681 Unsteadiness on feet: Secondary | ICD-10-CM | POA: Diagnosis not present

## 2021-07-16 DIAGNOSIS — M810 Age-related osteoporosis without current pathological fracture: Secondary | ICD-10-CM | POA: Diagnosis not present

## 2021-07-16 DIAGNOSIS — M4326 Fusion of spine, lumbar region: Secondary | ICD-10-CM | POA: Diagnosis not present

## 2021-08-11 DIAGNOSIS — H00024 Hordeolum internum left upper eyelid: Secondary | ICD-10-CM | POA: Diagnosis not present

## 2021-08-11 DIAGNOSIS — H0014 Chalazion left upper eyelid: Secondary | ICD-10-CM | POA: Diagnosis not present

## 2021-08-12 DIAGNOSIS — M5416 Radiculopathy, lumbar region: Secondary | ICD-10-CM | POA: Diagnosis not present

## 2021-08-23 DIAGNOSIS — H00024 Hordeolum internum left upper eyelid: Secondary | ICD-10-CM | POA: Diagnosis not present

## 2021-11-02 DIAGNOSIS — H35372 Puckering of macula, left eye: Secondary | ICD-10-CM | POA: Diagnosis not present

## 2021-11-16 DIAGNOSIS — R202 Paresthesia of skin: Secondary | ICD-10-CM | POA: Diagnosis not present

## 2021-11-16 DIAGNOSIS — M5416 Radiculopathy, lumbar region: Secondary | ICD-10-CM | POA: Diagnosis not present

## 2022-01-03 DIAGNOSIS — R200 Anesthesia of skin: Secondary | ICD-10-CM | POA: Diagnosis not present

## 2022-01-11 ENCOUNTER — Ambulatory Visit (INDEPENDENT_AMBULATORY_CARE_PROVIDER_SITE_OTHER): Payer: Medicare Other | Admitting: Family Medicine

## 2022-01-11 ENCOUNTER — Ambulatory Visit (INDEPENDENT_AMBULATORY_CARE_PROVIDER_SITE_OTHER): Payer: Medicare Other

## 2022-01-11 ENCOUNTER — Encounter: Payer: Self-pay | Admitting: Family Medicine

## 2022-01-11 VITALS — BP 128/82 | HR 70 | Temp 97.8°F | Ht 61.0 in

## 2022-01-11 DIAGNOSIS — R058 Other specified cough: Secondary | ICD-10-CM

## 2022-01-11 DIAGNOSIS — R059 Cough, unspecified: Secondary | ICD-10-CM | POA: Diagnosis not present

## 2022-01-11 DIAGNOSIS — J449 Chronic obstructive pulmonary disease, unspecified: Secondary | ICD-10-CM | POA: Diagnosis not present

## 2022-01-11 LAB — CBC
HCT: 38.2 % (ref 36.0–46.0)
Hemoglobin: 11.8 g/dL — ABNORMAL LOW (ref 12.0–15.0)
MCHC: 30.9 g/dL (ref 30.0–36.0)
MCV: 77.2 fl — ABNORMAL LOW (ref 78.0–100.0)
Platelets: 277 10*3/uL (ref 150.0–400.0)
RBC: 4.95 Mil/uL (ref 3.87–5.11)
RDW: 21.5 % — ABNORMAL HIGH (ref 11.5–15.5)
WBC: 5.6 10*3/uL (ref 4.0–10.5)

## 2022-01-11 MED ORDER — LEVOFLOXACIN 500 MG PO TABS
500.0000 mg | ORAL_TABLET | Freq: Every day | ORAL | 0 refills | Status: AC
Start: 2022-01-11 — End: 2022-01-18

## 2022-01-11 NOTE — Progress Notes (Signed)
Established Patient Office Visit  Subjective   Patient ID: Brandi Villarreal, female    DOB: Sep 07, 1945  Age: 76 y.o. MRN: [id number]  Chief Complaint  Patient presents with   Establish Care   Cough    Productive with clear-foamy sputum x2 weeks, tried Delsym with some relief    Patient is here with family member, states that she has had a cough for the past two weeks. No fever/chills, no chest pain or difficulty breathing. It is sometimes productive of foamy mucus. Reports that they had COVID about a month ago, states that the cough started about a week after they got over [place]. Thinks that there is a lot of drainage, nasal congestion. No sore throat, sinus pain or ear pain.   Pt reports she had a fairly significant back surgery recently. States she has follow up with the surgeon soon.   Cough Associated symptoms include nasal congestion and postnasal drip. Pertinent negatives include no chest pain, chills, ear congestion, ear pain, fever, sore throat or shortness of breath.   Current Outpatient Medications  Medication Instructions   acetaminophen (TYLENOL) 650 mg, Oral, Every 8 hours PRN   acyclovir ointment (ZOVIRAX) 5 % 1 application , Topical, Every  3 hours PRN   albuterol (VENTOLIN HFA) 108 (90 Base) MCG/ACT inhaler 2 puffs, Inhalation, Every 4 hours PRN   alendronate (FOSAMAX) 70 mg, Oral, Every Tue   gabapentin (NEURONTIN) 300 mg, Oral, Daily at bedtime   levofloxacin (LEVAQUIN) 500 mg, Oral, Daily   LUTEIN PO 1 tablet, Oral, Weekly   methocarbamol (ROBAXIN) 500-1,000 mg, Oral, 2 times daily PRN   Multiple Vitamin (MULTIVITAMIN WITH MINERALS) TABS tablet 1 tablet, Oral, 3 times weekly   Omega-3 Fatty Acids (OMEGA 3 PO) 15 mLs, Oral, Every morning   Sodium Chloride-Xylitol (XLEAR SINUS CARE SPRAY NA) 1-2 sprays, Nasal, 3 times daily PRN   Vitamin D-Vitamin K (K2 PLUS D3 PO) 10 drops, Oral, Every morning, (Liquid)     Patient Active Problem List   Diagnosis Date Noted    S/P lumbar fusion 06/28/2021   Histoplasmosis 08/21/2019   Chronic left shoulder pain 08/03/2017   Aortic atherosclerosis ([place]) 07/18/2017   Cholelithiasis 03/20/2012   Osteoporosis 08/09/2006      Review of Systems  Constitutional:  Negative for chills and fever.  HENT:  Positive for postnasal drip. Negative for ear pain and sore throat.   Respiratory:  Positive for cough. Negative for shortness of breath.   Cardiovascular:  Negative for chest pain.  All other systems reviewed and are negative.     Objective:     BP 128/82 (BP Location: Right Arm, Patient Position: Sitting, Cuff Size: Normal)   Pulse 70   Temp 97.8 F (36.6 C) (Oral)   Ht '5\' 1"'$  (1.549 m)   SpO2 98%   BMI 28.34 kg/m    Physical Exam Vitals reviewed.  Constitutional:      Appearance: Normal appearance. She is well-groomed and normal weight.  HENT:     Head: Normocephalic and atraumatic.     Right Ear: Tympanic membrane normal.     Left Ear: Tympanic membrane normal.     Mouth/Throat:     Mouth: Mucous membranes are moist.     Pharynx: Oropharynx is clear. No oropharyngeal exudate or posterior oropharyngeal erythema.  Eyes:     Conjunctiva/sclera: Conjunctivae normal.  Cardiovascular:     Rate and Rhythm: Normal rate and regular rhythm.     Pulses: Normal pulses.  Heart sounds: S1 normal and S2 normal.  Pulmonary:     Effort: Pulmonary effort is normal.     Breath sounds: Normal breath sounds and air entry.  Lymphadenopathy:     Cervical: No cervical adenopathy.  Neurological:     Mental Status: She is alert and oriented to person, place, and time. Mental status is at baseline.     Gait: Gait is intact.  Psychiatric:        Mood and Affect: Mood and affect normal.        Speech: Speech normal.        Behavior: Behavior normal.        Judgment: Judgment normal.      No results found for any visits on 01/11/22.    The 10-year ASCVD risk score (Arnett DK, et al., 2019) is: 15.6%     Assessment & Plan:   Problem List Items Addressed This Visit   None Visit Diagnoses     Productive cough    -  Primary   Relevant Medications   levofloxacin (LEVAQUIN) 500 MG tablet   Other Relevant Orders   DG Chest 2 View   CBC (no diff)  There is a possibility of postviral pneumonia, however her physical exam is relatively benign, I will order CXR and treat empirically with levaquin 500 mg daily for 7 days. Will also order a CBC to look for elevated WBC.     Return in about 3 months (around 04/13/2022) for Annual Physcial Exam.    Farrel Conners, MD

## 2022-01-11 NOTE — Progress Notes (Signed)
Slight anemia is still present but her White count is normal - MCV is lower than previous indicating a possible iron deficiency-- I recommend adding an iron supplement once a day and we can recheck this blood work at her next visit.

## 2022-01-12 NOTE — Progress Notes (Signed)
CXR is negative for pneumonia, I would have her finish the antibiotics I gave her to see if this will help with the nasal congestion and drainage

## 2022-01-13 DIAGNOSIS — R413 Other amnesia: Secondary | ICD-10-CM | POA: Diagnosis not present

## 2022-01-13 DIAGNOSIS — R202 Paresthesia of skin: Secondary | ICD-10-CM | POA: Diagnosis not present

## 2022-02-16 DIAGNOSIS — H00022 Hordeolum internum right lower eyelid: Secondary | ICD-10-CM | POA: Diagnosis not present

## 2022-02-28 ENCOUNTER — Encounter: Payer: Self-pay | Admitting: Diagnostic Neuroimaging

## 2022-02-28 ENCOUNTER — Ambulatory Visit: Payer: Medicare Other | Admitting: Diagnostic Neuroimaging

## 2022-02-28 VITALS — BP 118/76 | HR 74 | Ht 62.0 in | Wt 158.0 lb

## 2022-02-28 DIAGNOSIS — G3184 Mild cognitive impairment, so stated: Secondary | ICD-10-CM | POA: Diagnosis not present

## 2022-02-28 DIAGNOSIS — R413 Other amnesia: Secondary | ICD-10-CM

## 2022-02-28 NOTE — Progress Notes (Unsigned)
[facility]  PATIENT: Brandi Villarreal DOB: 1945/10/25  REFERRING CLINICIAN: Eustace Moore, MD HISTORY FROM: patient and husband REASON FOR VISIT: follow up    HISTORICAL  CHIEF COMPLAINT:  Chief Complaint  Patient presents with   Neurologic Problem    Pt with, rm 6, pt had a back surgery with Dr Ronnald Ramp. She has developed memory loss during the time. She was taking gabapentin and robaxin. stopped taking the gabapentin in last 5/6 weeks and there has been some improvement and the robaxin has been reduced to 2-3 a week. He states there has been improvement on memory but still not back to baseline.     HISTORY OF PRESENT ILLNESS:   UPDATE (02/28/22, VRP): Since last visit, had low back surgery 06/28/21. Right leg issues continue. Also having some short term memory loss issues (since ~2021). Repeating herself. Forgetting recent conversations. Got lost driving in [id number] when she was driving before.  Patient is mainly noted by patient's husband.  Patient notes some problems and does not think they are that significant.  She is able to take care of most of her ADLs at home.  She is having more difficulty with more complex tasks and operating independently outside of the home.  PRIOR HPI (11/04/20): 76 year old female here for evaluation of right leg numbness and weakness.  10/10/2020 patient was climbing into bed, on her knees when all of a sudden her left knee was injured.  The next morning her left knee was swollen and she had difficulty bearing weight on the left side.  2 days later she was back in [place] and went to [facility] orthopedic for evaluation.  She had cortisone injection of the left knee.  Left knee steadily improved over the next week.  10/24/2020 patient was out of town and woke up with sudden right toe and foot numbness and tingling.  She was having difficulty with muscle weakness on the right side and bearing weight.  Symptoms worsened over the next day.  She  also noticed some right shoulder numbness and pain.  2 days later she also noticed some tingling station of the left foot.  She had MRI of the lumbar spine which showed mild to moderate spinal stenosis at L4-5 and some other degenerative changes.  Since then patient continues to have problems with gait and balance difficulty.  She is mainly using a wheelchair.  She is not able to stand on her own.  Needs extreme help from husband to transfer and use bathroom.  1 month prior to onset of symptoms patient and husband had a flu symptoms.  They were COVID-negative at that time.    REVIEW OF SYSTEMS: Full 14 system review of systems performed and negative with exception of: as per HPI.  ALLERGIES: Allergies  Allergen Reactions   Iodine     Anaphylaxis following IVP dye   Penicillins     Has patient had a PCN reaction causing immediate rash, facial/tongue/throat swelling, SOB or lightheadedness with hypotension: Yes Has patient had a PCN reaction causing severe rash involving mucus membranes or skin necrosis: No Has patient had a PCN reaction that required hospitalization: No Has patient had a PCN reaction occurring within the last 10 years: No If all of the above answers are "NO", then may proceed with Cephalosporin use.    Morphine     Mental status changes   Sulfonamide Derivatives     itching   Cortisone Itching    Due to eye disorder  Doxycycline Hives    Ringing in ears   Fluticasone Propionate     ? Reaction-advised not to take per eye doctor   Hydrocodone Itching   Other Nausea And Vomiting    Potato    HOME MEDICATIONS: Outpatient Medications Prior to Visit  Medication Sig Dispense Refill   acetaminophen (TYLENOL) 650 MG CR tablet Take 650 mg by mouth every 8 (eight) hours as needed for pain.     acyclovir ointment (ZOVIRAX) 5 % Apply 1 application. topically every 3 (three) hours as needed (rash).     alendronate (FOSAMAX) 70 MG tablet Take 70 mg by mouth every Tuesday.      LUTEIN PO Take 1 tablet by mouth once a week.     methocarbamol (ROBAXIN) 500 MG tablet Take 1-2 tablets (500-1,000 mg total) by mouth 2 (two) times daily as needed for muscle spasms. 45 tablet 0   Multiple Vitamin (MULTIVITAMIN WITH MINERALS) TABS tablet Take 1 tablet by mouth 3 (three) times a week.     Omega-3 Fatty Acids (OMEGA 3 PO) Take 15 mLs by mouth in the morning.     Sodium Chloride-Xylitol (XLEAR SINUS CARE SPRAY NA) Place 1-2 sprays into the nose 3 (three) times daily as needed (congestion.).     Vitamin D-Vitamin K (K2 PLUS D3 PO) Take 10 drops by mouth in the morning. (Liquid)     albuterol (VENTOLIN HFA) 108 (90 Base) MCG/ACT inhaler Inhale 2 puffs into the lungs every 4 (four) hours as needed. 1 Inhaler 1   gabapentin (NEURONTIN) 300 MG capsule Take 300 mg by mouth at bedtime.     No facility-administered medications prior to visit.    PAST MEDICAL HISTORY: Past Medical History:  Diagnosis Date   Cholelithiasis    rare symptoms, declined surgery   Colitis ?1975   Hemorrhoids    Lichen sclerosus    sees gyn for management   Mitral valve prolapse    Osteoporosis    sees gyn   Right leg weakness    Varicose veins     PAST SURGICAL HISTORY: Past Surgical History:  Procedure Laterality Date   BACK SURGERY  06/28/2021   by Dr Ronnald Ramp   jaw resection     RHINOPLASTY     TONSILLECTOMY     TUBAL LIGATION     bilteral    FAMILY HISTORY: Family History  Problem Relation Age of Onset   Colon cancer Brother 38       died at age 101   Pancreatic cancer Brother    Atrial fibrillation Mother    Alzheimer's disease Father    Healthy Sister    Healthy Sister    Other Sister        immune issues?   Breast cancer Sister 57   Healthy Brother     SOCIAL HISTORY: Social History   Socioeconomic History   Marital status: Married    Spouse name: [name]   Number of children: Not on file   Years of education: Not on file   Highest education level: Not on file   Occupational History    Comment: retired Radio broadcast assistant  Tobacco Use   Smoking status: Never   Smokeless tobacco: Never  Vaping Use   Vaping Use: Never used  Substance and Sexual Activity   Alcohol use: No    Comment: wine-rare   Drug use: No   Sexual activity: Not on file  Other Topics Concern   Not on file  Social History Narrative  Work or School: retired - Clinical cytogeneticist Situation: lives with husband      Spiritual Beliefs: Christian      Lifestyle: no regular exercise; diet is good      Right handed       MB RN 11/04/2020    Social Determinants of Health   Financial Resource Strain: Low Risk  (05/13/2020)   Overall Financial Resource Strain (CARDIA)    Difficulty of Paying Living Expenses: Not hard at all  Food Insecurity: No Food Insecurity (05/13/2020)   Hunger Vital Sign    Worried About Running Out of Food in the Last Year: Never true    [place] in the Last Year: Never true  Transportation Needs: No Transportation Needs (05/13/2020)   PRAPARE - Hydrologist (Medical): No    Lack of Transportation (Non-Medical): No  Physical Activity: Sufficiently Active (05/13/2020)   Exercise Vital Sign    Days of Exercise per Week: 7 days    Minutes of Exercise per Session: 60 min  Stress: No Stress Concern Present (05/13/2020)   [place]    Feeling of Stress : Not at all  Social Connections: Moderately Integrated (05/13/2020)   Social Connection and Isolation Panel [NHANES]    Frequency of Communication with Friends and Family: More than three times a week    Frequency of Social Gatherings with Friends and Family: More than three times a week    Attends Religious Services: More than 4 times per year    Active Member of [employer] or Organizations: No    Attends Archivist Meetings: Never    Marital Status: Married  Human resources officer Violence: Not At Risk (05/13/2020)    Humiliation, Afraid, Rape, and Kick questionnaire    Fear of Current or Ex-Partner: No    Emotionally Abused: No    Physically Abused: No    Sexually Abused: No     PHYSICAL EXAM  GENERAL EXAM/CONSTITUTIONAL: Vitals:  Vitals:   02/28/22 1458  BP: 118/76  Pulse: 74  Weight: 158 lb (71.7 kg)  Height: '5\' 2"'$  (1.575 m)   Body mass index is 28.9 kg/m. Wt Readings from Last 3 Encounters:  02/28/22 158 lb (71.7 kg)  06/28/21 150 lb (68 kg)  06/22/21 165 lb 4.8 oz (75 kg)   Patient is in no distress; well developed, nourished and groomed; neck is supple  CARDIOVASCULAR: Examination of carotid arteries is normal; no carotid bruits Regular rate and rhythm, no murmurs Examination of peripheral vascular system by observation and palpation is normal  EYES: Ophthalmoscopic exam of optic discs and posterior segments is normal; no papilledema or hemorrhages No results found.  MUSCULOSKELETAL: Gait, strength, tone, movements noted in Neurologic exam below  NEUROLOGIC: MENTAL STATUS:     02/28/2022    3:02 PM  MMSE - Mini Mental State Exam  Orientation to time 4  Orientation to Place 3  Registration 3  Attention/ Calculation 5  Recall 2  Language- name 2 objects 2  Language- repeat 1  Language- follow 3 step command 3  Language- read & follow direction 1  Write a sentence 1  Copy design 1  Total score 26   awake, alert, oriented to person, place and time recent and remote memory intact normal attention and concentration language fluent, comprehension intact, naming intact fund of knowledge appropriate  CRANIAL NERVE:  2nd - no papilledema on  fundoscopic exam 2nd, 3rd, 4th, 6th - pupils equal and reactive to light, visual fields full to confrontation, extraocular muscles intact, no nystagmus 5th - facial sensation symmetric 7th - facial strength symmetric 8th - hearing intact 9th - palate elevates symmetrically, uvula midline 11th - shoulder shrug  symmetric 12th - tongue protrusion midline  MOTOR:  RUE DELTOID 3, TRICEPS 4; OTHERWISE 5 LUE 5 RLE HIP FLEX 3-4 (LIMITED BY PAIN), KNEE EXT 5, KNEE FLEX 2-3, DF 3 LLE 5  SENSORY:  normal and symmetric to light touch, temperature, vibration; EXCEPT DECR IN RIGHT KNEE  COORDINATION:  finger-nose-finger, fine finger movements normal  REFLEXES:  deep tendon reflexes 2+ and symmetric  GAIT/STATION:  BARELY ABLE TO STAND WITH ASSISTANCE; CANNOT TAKE ANY STEPS WITH RIGHT LEG     DIAGNOSTIC DATA (LABS, IMAGING, TESTING) - I reviewed patient records, labs, notes, testing and imaging myself where available.  Lab Results  Component Value Date   WBC 5.6 01/11/2022   HGB 11.8 (L) 01/11/2022   HCT 38.2 01/11/2022   MCV 77.2 (L) 01/11/2022   PLT 277.0 01/11/2022      Component Value Date/Time   NA 140 06/22/2021 1022   NA 142 11/04/2020 1113   K 4.1 06/22/2021 1022   CL 107 06/22/2021 1022   CO2 24 06/22/2021 1022   GLUCOSE 86 06/22/2021 1022   BUN 17 06/22/2021 1022   BUN 14 11/04/2020 1113   CREATININE 0.88 06/22/2021 1022   CALCIUM 9.0 06/22/2021 1022   PROT 6.6 11/04/2020 1113   ALBUMIN 4.0 11/04/2020 1113   AST 16 11/04/2020 1113   ALT 14 11/04/2020 1113   ALKPHOS 112 11/04/2020 1113   BILITOT 0.4 11/04/2020 1113   GFRNONAA >60 06/22/2021 1022   GFRAA >60 06/28/2017 0940   Lab Results  Component Value Date   CHOL 145 08/19/2019   HDL 49.80 08/19/2019   LDLCALC 81 08/19/2019   TRIG 75.0 08/19/2019   CHOLHDL 3 08/19/2019   Lab Results  Component Value Date   HGBA1C 5.6 11/04/2020   Lab Results  Component Value Date   VITAMINB12 1,184 11/04/2020   Lab Results  Component Value Date   TSH 1.300 11/04/2020    08/03/17 MRI lumbar spine [I reviewed images myself and agree with interpretation. -VRP]  1. At L4-L5, grade 1 anterolisthesis with mild to moderate canal and bilateral subarticular recess stenosis and mild right greater than left foraminal  stenosis. 2. At L3-L4, right eccentric facet hypertrophy closely approximates the exiting right L3 nerve with mild-to-moderate right foraminal stenosis.  11/17/20 MRI brain -Intermittently motion degraded examination, as described. -No evidence of acute intracranial abnormality. -Minimal chronic small-vessel ischemic changes within the cerebral white matter. -Otherwise unremarkable MRI appearance of the brain for age. -Trace fluid within the bilateral mastoid air cells.    ASSESSMENT AND PLAN  75 y.o. year old female here with sudden onset right side leg numbness, weakness and pain starting on 10/24/2020. Now with progressive memory loss.  Dx:  1. Memory loss     PLAN:  MEMORY LOSS (concern for mild cognitive impairment or mild dementia, confounded by significant pain and mobility limitations, and MMSE of 26 out of 30) - check ATN profile; then consider amyloid PET scan - safety / supervision issues reviewed - daily physical activity / exercise (at least 15-30 minutes) - eat more plants / vegetables - increase social activities, brain stimulation, games, puzzles, hobbies, crafts, arts, music - aim for at least 7-8 hours sleep per  night (or more) - avoid smoking and alcohol - caregiver resources provided - caution with medications, finances; no driving  GAIT DIFFICULTY - use wheelchair, walker; fall precautions  Orders Placed This Encounter  Procedures   ATN PROFILE   Return for pending if symptoms worsen or fail to improve, pending test results.    Penni Bombard, MD 09/64/3838, 1:84 PM Certified in Neurology, Neurophysiology and Neuroimaging  [facility] [address] [place],  [address] [phone]

## 2022-02-28 NOTE — Patient Instructions (Signed)
MEMORY LOSS  - check ATN profile; then consider amyloid PET scan - safety / supervision issues reviewed - daily physical activity / exercise (at least 15-30 minutes) - eat more plants / vegetables - increase social activities, brain stimulation, games, puzzles, hobbies, crafts, arts, music - aim for at least 7-8 hours sleep per night (or more) - avoid smoking and alcohol - caregiver resources provided - caution with medications, finances; no driving

## 2022-03-01 ENCOUNTER — Encounter: Payer: Self-pay | Admitting: Diagnostic Neuroimaging

## 2022-03-03 LAB — ATN PROFILE
A -- Beta-amyloid 42/40 Ratio: 0.09 — ABNORMAL LOW (ref >0.102)
Beta-amyloid 40: 229.8 pg/mL
Beta-amyloid 42: 20.66 pg/mL
N -- NfL, Plasma: 3.33 pg/mL (ref 0.00–7.64)
T -- p-tau181: 1.78 pg/mL — ABNORMAL HIGH (ref 0.00–0.97)

## 2022-03-11 NOTE — Addendum Note (Signed)
Addended by: Andrey Spearman R on: 03/11/2022 01:10 PM   Modules accepted: Orders

## 2022-03-16 ENCOUNTER — Encounter: Payer: Self-pay | Admitting: Neurology

## 2022-03-21 ENCOUNTER — Telehealth: Payer: Self-pay | Admitting: Diagnostic Neuroimaging

## 2022-03-21 NOTE — Telephone Encounter (Signed)
[employer] is requiring a peer to peer for the pet scan to get approved. Please call [phone] option 3, reference #[id number] before 03/26/22.

## 2022-03-21 NOTE — Telephone Encounter (Signed)
The insurance info pt sent is already under coverages in her account.

## 2022-03-29 NOTE — Telephone Encounter (Signed)
The pet scan was denied. I will put the denial letter in pod 3.

## 2022-04-06 ENCOUNTER — Other Ambulatory Visit: Payer: Self-pay | Admitting: Obstetrics and Gynecology

## 2022-04-06 DIAGNOSIS — N644 Mastodynia: Secondary | ICD-10-CM

## 2022-04-07 ENCOUNTER — Telehealth: Payer: Self-pay | Admitting: Family Medicine

## 2022-04-07 NOTE — Telephone Encounter (Signed)
Left message for patient to call back and schedule Medicare Annual Wellness Visit (AWV) either virtually or in office. Left  my Herbie Drape number [phone]   Last AWV 05/13/20 please schedule with Nurse Health Adviser   45 min for awv-i and in office appointments 30 min for awv-s  phone/virtual appointments

## 2022-04-11 NOTE — Telephone Encounter (Signed)
We are working on appealing the decision for Brandi Villarreal.

## 2022-04-11 NOTE — Telephone Encounter (Signed)
Pt's husband is asking for a call from [place], [place] re: what Dr Leta Baptist wants to do as a result of the denial.  He can be reached at [phone]

## 2022-04-12 ENCOUNTER — Telehealth: Payer: Self-pay | Admitting: Diagnostic Neuroimaging

## 2022-04-12 ENCOUNTER — Ambulatory Visit (INDEPENDENT_AMBULATORY_CARE_PROVIDER_SITE_OTHER): Payer: Medicare Other

## 2022-04-12 ENCOUNTER — Encounter: Payer: Self-pay | Admitting: Neurology

## 2022-04-12 VITALS — Ht 62.0 in | Wt 158.0 lb

## 2022-04-12 DIAGNOSIS — R413 Other amnesia: Secondary | ICD-10-CM

## 2022-04-12 DIAGNOSIS — G3184 Mild cognitive impairment, so stated: Secondary | ICD-10-CM

## 2022-04-12 DIAGNOSIS — Z0000 Encounter for general adult medical examination without abnormal findings: Secondary | ICD-10-CM

## 2022-04-12 NOTE — Progress Notes (Signed)
Subjective:   Brandi Villarreal is a 77 y.o. female who presents for Medicare Annual (Subsequent) preventive examination.  Review of Systems    Virtual Visit via Telephone Note  I connected with  Brandi Villarreal on 04/12/22 at  8:30 AM EST by telephone and verified that I am speaking with the correct person using two identifiers.  Location: Patient: Home Provider: Office Persons participating in the virtual visit: patient/Nurse Health Advisor   I discussed the limitations, risks, security and privacy concerns of performing an evaluation and management service by telephone and the availability of in person appointments. The patient expressed understanding and agreed to proceed.  Interactive audio and video telecommunications were attempted between this nurse and patient, however failed, due to patient having technical difficulties OR patient did not have access to video capability.  We continued and completed visit with audio only.  Some vital signs may be absent or patient reported.   [name], LPN  Cardiac Risk Factors include: advanced age (>33mn, >>50women)     Objective:    Today's Vitals   04/12/22 0834  Weight: 158 lb (71.7 kg)  Height: '5\' 2"'$  (1.575 m)   Body mass index is 28.9 kg/m.     04/12/2022    8:41 AM 06/28/2021    7:32 AM 06/22/2021   10:18 AM 05/12/2021    2:55 PM 12/03/2020    8:50 AM 05/13/2020    3:14 PM 01/23/2018   11:54 AM  Advanced Directives  Does Patient Have a Medical Advance Directive? Yes Yes Yes Yes Yes Yes Yes  Type of AParamed[place]of [place] will [place]Living will [place] will   [place]Living will [place]Living will  Does patient want to make changes to medical advance directive? No - Patient declined No - Patient declined No - Patient declined   No - Patient declined No - Patient declined  Copy of [place]in Chart? Yes - validated most recent copy scanned in chart (See row information) No - copy requested No - copy requested   Yes - validated most recent copy scanned in chart (See row information) No - copy requested    Current Medications (verified) Outpatient Encounter Medications as of 04/12/2022  Medication Sig   acetaminophen (TYLENOL) 650 MG CR tablet Take 650 mg by mouth every 8 (eight) hours as needed for pain.   acyclovir ointment (ZOVIRAX) 5 % Apply 1 application. topically every 3 (three) hours as needed (rash).   alendronate (FOSAMAX) 70 MG tablet Take 70 mg by mouth every Tuesday.   LUTEIN PO Take 1 tablet by mouth once a week.   methocarbamol (ROBAXIN) 500 MG tablet Take 1-2 tablets (500-1,000 mg total) by mouth 2 (two) times daily as needed for muscle spasms.   Multiple Vitamin (MULTIVITAMIN WITH MINERALS) TABS tablet Take 1 tablet by mouth 3 (three) times a week.   Omega-3 Fatty Acids (OMEGA 3 PO) Take 15 mLs by mouth in the morning.   Sodium Chloride-Xylitol (XLEAR SINUS CARE SPRAY NA) Place 1-2 sprays into the nose 3 (three) times daily as needed (congestion.).   Vitamin D-Vitamin K (K2 PLUS D3 PO) Take 10 drops by mouth in the morning. (Liquid)   No facility-administered encounter medications on file as of 04/12/2022.    Allergies (verified) Iodine, Penicillins, Morphine, Sulfonamide derivatives, Cortisone, Doxycycline, Fluticasone propionate, Hydrocodone, and Other   History: Past Medical History:  Diagnosis Date   Cholelithiasis    rare symptoms, declined surgery   Colitis ?1975   Hemorrhoids    Lichen sclerosus    sees gyn for management   Mitral valve prolapse    Osteoporosis    sees gyn   Right leg weakness    Varicose veins    Past Surgical History:  Procedure Laterality Date   BACK SURGERY  06/28/2021   by Dr Ronnald Ramp   jaw resection     RHINOPLASTY     TONSILLECTOMY     TUBAL LIGATION     bilteral   Family History  Problem Relation Age of  Onset   Colon cancer Brother 19       died at age 9   Pancreatic cancer Brother    Atrial fibrillation Mother    Alzheimer's disease Father    Healthy Sister    Healthy Sister    Other Sister        immune issues?   Breast cancer Sister 97   Healthy Brother    Social History   Socioeconomic History   Marital status: Married    Spouse name: [name]   Number of children: Not on file   Years of education: Not on file   Highest education level: Not on file  Occupational History    Comment: retired Radio broadcast assistant  Tobacco Use   Smoking status: Never   Smokeless tobacco: Never  Vaping Use   Vaping Use: Never used  Substance and Sexual Activity   Alcohol use: No    Comment: wine-rare   Drug use: No   Sexual activity: Not on file  Other Topics Concern   Not on file  Social History Narrative   Work or School: retired - Merchandiser, retail      Home Situation: lives with husband      Spiritual Beliefs: Christian      Lifestyle: no regular exercise; diet is good      Right handed       MB RN 11/04/2020    Social Determinants of Health   Financial Resource Strain: Low Risk  (04/12/2022)   Overall Financial Resource Strain (CARDIA)    Difficulty of Paying Living Expenses: Not hard at all  Food Insecurity: No Food Insecurity (05/13/2020)   Hunger Vital Sign    Worried About Running Out of Food in the Last Year: Never true    [place] in the Last Year: Never true  Transportation Needs: No Transportation Needs (04/12/2022)   PRAPARE - Hydrologist (Medical): No    Lack of Transportation (Non-Medical): No  Physical Activity: Sufficiently Active (05/13/2020)   Exercise Vital Sign    Days of Exercise per Week: 7 days    Minutes of Exercise per Session: 60 min  Stress: No Stress Concern Present (04/12/2022)   [place]    Feeling of Stress : Not at all  Social Connections: [place]  (04/12/2022)   Social Connection and Isolation Panel [NHANES]    Frequency of Communication with Friends and Family: More than three times a week    Frequency of Social Gatherings with Friends and Family: More than three times a week    Attends Religious Services: More than 4 times per year    Active Member of [employer] or Organizations: Yes    Attends Archivist Meetings: More than 4 times per year    Marital Status:  Married    Tobacco Counseling Counseling given: Not Answered   Clinical Intake:  Pre-visit preparation completed: No  Pain : No/denies pain     BMI - recorded: 28.9 Nutritional Status: BMI 25 -29 Overweight Nutritional Risks: None Diabetes: No  How often do you need to have someone help you when you read instructions, pamphlets, or other written materials from your doctor or pharmacy?: 1 - Never  Diabetic? No  Interpreter Needed?: No  Information entered by :: Rolene Arbour LPN   Activities of Daily Living    04/12/2022    8:40 AM 06/22/2021   10:20 AM  In your present state of health, do you have any difficulty performing the following activities:  Hearing? 0   Vision? 0   Difficulty concentrating or making decisions? 0   Walking or climbing stairs? 0   Dressing or bathing? 0   Doing errands, shopping? 0 0  Preparing Food and eating ? N   Using the Toilet? N   In the past six months, have you accidently leaked urine? Y   Comment Wears pads. Followed by PCP   Do you have problems with loss of bowel control? N   Managing your Medications? N   Managing your Finances? N   Housekeeping or managing your Housekeeping? N     Patient Care Team: Farrel Conners, MD as PCP - General (Family Medicine) Harriett Sine, MD as Consulting Physician (Dermatology) Ladene Artist, MD as Consulting Physician (Gastroenterology) Leandrew Koyanagi, MD as Attending Physician (Orthopedic Surgery) [facility] K[facility], DO as  Consulting Physician (Family Medicine) Lucretia Kern, DO as Consulting Physician (Family Medicine)  Indicate any recent Medical Services you may have received from other than [facility] providers in the past year (date may be approximate).     Assessment:   This is a routine wellness examination for Araceli.  Hearing/Vision screen Hearing Screening - Comments:: Denies hearing difficulties   Vision Screening - Comments:: Wears rx glasses - up to date with routine eye exams with  [facility]  Dietary issues and exercise activities discussed: Exercise limited by: None identified   Goals Addressed               This Visit's Progress     Patient Stated (pt-stated)        I would like to keep getting stronger.       Depression Screen    04/12/2022    8:39 AM 01/11/2022   11:03 AM 05/13/2020    3:17 PM 12/26/2017    1:30 PM 07/26/2016    9:44 AM  PHQ 2/9 Scores  PHQ - 2 Score 0 2 0 0 0  PHQ- 9 Score  5       Fall Risk    04/12/2022    8:41 AM 01/11/2022   11:03 AM 05/13/2020    3:16 PM 12/26/2017    1:30 PM 07/26/2016    9:44 AM  Fall Risk   Falls in the past year? 0 0 0 Yes No  Number falls in past yr: 0 0 0 1   Injury with Fall? 0 0 0 Yes   Risk for fall due to : No Fall Risks No Fall Risks No Fall Risks    Follow up Falls prevention discussed Falls evaluation completed Falls evaluation completed;Falls prevention discussed      FALL RISK PREVENTION PERTAINING TO THE HOME:  Any stairs in or around the home? Yes  If so, are there any without handrails? No  Home free of loose throw rugs in walkways, pet beds, electrical cords, etc? Yes  Adequate lighting in your home to reduce risk of falls? Yes   ASSISTIVE DEVICES UTILIZED TO PREVENT FALLS:  Life alert? No  Use of a cane, walker or w/c? Yes Grab bars in the bathroom? Yes  Shower chair or bench in shower? Yes Elevated toilet seat or a handicapped toilet? Yes   TIMED UP AND GO:  Was the test performed? No . Audio  Visit   Cognitive Function:    02/28/2022    3:02 PM  MMSE - Mini Mental State Exam  Orientation to time 4  Orientation to Place 3  Registration 3  Attention/ Calculation 5  Recall 2  Language- name 2 objects 2  Language- repeat 1  Language- follow 3 step command 3  Language- read & follow direction 1  Write a sentence 1  Copy design 1  Total score 26        04/12/2022    8:41 AM  6CIT Screen  What Year? 0 points  What month? 0 points  What time? 0 points  Count back from 20 0 points  Months in reverse 0 points  Repeat phrase 0 points  Total Score 0 points    Immunizations Immunization History  Administered Date(s) Administered   PFIZER(Purple Top)SARS-COV-2 Vaccination 05/26/2019, 06/25/2019   Td 07/26/2016    TDAP status: Up to date  Flu Vaccine status: Declined, Education has been provided regarding the importance of this vaccine but patient still declined. Advised may receive this vaccine at local pharmacy or Health Dept. Aware to provide a copy of the vaccination record if obtained from local pharmacy or Health Dept. Verbalized acceptance and understanding.  Pneumococcal vaccine status: Declined,  Education has been provided regarding the importance of this vaccine but patient still declined. Advised may receive this vaccine at local pharmacy or Health Dept. Aware to provide a copy of the vaccination record if obtained from local pharmacy or Health Dept. Verbalized acceptance and understanding.   Covid-19 vaccine status: Completed vaccines  Qualifies for Shingles Vaccine? Yes   Zostavax completed No   Shingrix Completed?: No.    Education has been provided regarding the importance of this vaccine. Patient has been advised to call insurance company to determine out of pocket expense if they have not yet received this vaccine. Advised may also receive vaccine at local pharmacy or Health Dept. Verbalized acceptance and understanding.  Screening Tests Health  Maintenance  Topic Date Due   Zoster Vaccines- Shingrix (1 of 2) 04/13/2022 (Originally 01/20/1965)   COVID-19 Vaccine (3 - Pfizer risk series) 04/28/2022 (Originally 07/23/2019)   Pneumonia Vaccine 55+ Years old (1 - PCV) 04/13/2023 (Originally 01/21/2011)   INFLUENZA VACCINE  11/30/2025 (Originally 11/02/2021)   Medicare Annual Wellness (AWV)  04/13/2023   DTaP/Tdap/Td (2 - Tdap) 07/27/2026   DEXA SCAN  Completed   Hepatitis C Screening  Completed   HPV VACCINES  Aged Out   COLONOSCOPY (Pts 45-59yr Insurance coverage will need to be confirmed)  Discontinued    Health Maintenance  There are no preventive care reminders to display for this patient.   Colorectal cancer screening: No longer required.   Mammogram status: No longer required due to Age.  Bone Density status: Completed 08/26/14. Results reflect: Bone density results: OSTEOPOROSIS. Repeat every   years.  Lung Cancer Screening: (Low Dose CT Chest recommended if Age 77-80years, 30 pack-year  currently smoking OR have quit w/in 15years.) does not qualify.     Additional Screening:  Hepatitis C Screening: does qualify; Completed 07/26/16  Vision Screening: Recommended annual ophthalmology exams for early detection of glaucoma and other disorders of the eye. Is the patient up to date with their annual eye exam?  Yes  Who is the provider or what is the name of the office in which the patient attends annual eye exams? [facility] If pt is not established with a provider, would they like to be referred to a provider to establish care? No .   Dental Screening: Recommended annual dental exams for proper oral hygiene  Community Resource Referral / Chronic Care Management:  CRR required this visit?  No   CCM required this visit?  No      Plan:     I have personally reviewed and noted the following in the patient's chart:   Medical and social history Use of alcohol, tobacco or illicit drugs  Current medications and  supplements including opioid prescriptions. Patient is not currently taking opioid prescriptions. Functional ability and status Nutritional status Physical activity Advanced directives List of other physicians Hospitalizations, surgeries, and ER visits in previous 12 months Vitals Screenings to include cognitive, depression, and falls Referrals and appointments  In addition, I have reviewed and discussed with patient certain preventive protocols, quality metrics, and best practice recommendations. A written personalized care plan for preventive services as well as general preventive health recommendations were provided to patient.     [name], LPN   04/09/1094   Nurse Notes: None

## 2022-04-12 NOTE — Telephone Encounter (Signed)
[employer] medicare NPR sent to GI [phone] 

## 2022-04-12 NOTE — Patient Instructions (Addendum)
Brandi Villarreal , Thank you for taking time to come for your Medicare Wellness Visit. I appreciate your ongoing commitment to your health goals. Please review the following plan we discussed and let me know if I can assist you in the future.   These are the goals we discussed:  Goals       Patient Stated (pt-stated)      I would like to keep getting stronger.        This is a list of the screening recommended for you and due dates:  Health Maintenance  Topic Date Due   Zoster (Shingles) Vaccine (1 of 2) 04/13/2022*   COVID-19 Vaccine (3 - Pfizer risk series) 04/28/2022*   Pneumonia Vaccine (1 - PCV) 04/13/2023*   Flu Shot  11/30/2025*   Medicare Annual Wellness Visit  04/13/2023   DTaP/Tdap/Td vaccine (2 - Tdap) 07/27/2026   DEXA scan (bone density measurement)  Completed   Hepatitis C Screening: USPSTF Recommendation to screen - Ages 50-79 yo.  Completed   HPV Vaccine  Aged Out   Colon Cancer Screening  Discontinued  *Topic was postponed. The date shown is not the original due date.    Advanced directives: In Chart  Conditions/risks identified: None  Next appointment: Follow up in one year for your annual wellness visit     Preventive Care 65 Years and Older, Female Preventive care refers to lifestyle choices and visits with your health care provider that can promote health and wellness. What does preventive care include? A yearly physical exam. This is also called an annual well check. Dental exams once or twice a year. Routine eye exams. Ask your health care provider how often you should have your eyes checked. Personal lifestyle choices, including: Daily care of your teeth and gums. Regular physical activity. Eating a healthy diet. Avoiding tobacco and drug use. Limiting alcohol use. Practicing safe sex. Taking low-dose aspirin every day. Taking vitamin and mineral supplements as recommended by your health care provider. What happens during an annual well check? The  services and screenings done by your health care provider during your annual well check will depend on your age, overall health, lifestyle risk factors, and family history of disease. Counseling  Your health care provider may ask you questions about your: Alcohol use. Tobacco use. Drug use. Emotional well-being. Home and relationship well-being. Sexual activity. Eating habits. History of falls. Memory and ability to understand (cognition). Work and work Statistician. Reproductive health. Screening  You may have the following tests or measurements: Height, weight, and BMI. Blood pressure. Lipid and cholesterol levels. These may be checked every 5 years, or more frequently if you are over 53 years old. Skin check. Lung cancer screening. You may have this screening every year starting at age 18 if you have a 30-pack-year history of smoking and currently smoke or have quit within the past 15 years. Fecal occult blood test (FOBT) of the stool. You may have this test every year starting at age 22. Flexible sigmoidoscopy or colonoscopy. You may have a sigmoidoscopy every 5 years or a colonoscopy every 10 years starting at age 11. Hepatitis C blood test. Hepatitis B blood test. Sexually transmitted disease (STD) testing. Diabetes screening. This is done by checking your blood sugar (glucose) after you have not eaten for a while (fasting). You may have this done every 1-3 years. Bone density scan. This is done to screen for osteoporosis. You may have this done starting at age 45. Mammogram. This may be done  every 1-2 years. Talk to your health care provider about how often you should have regular mammograms. Talk with your health care provider about your test results, treatment options, and if necessary, the need for more tests. Vaccines  Your health care provider may recommend certain vaccines, such as: Influenza vaccine. This is recommended every year. Tetanus, diphtheria, and acellular  pertussis (Tdap, Td) vaccine. You may need a Td booster every 10 years. Zoster vaccine. You may need this after age 16. Pneumococcal 13-valent conjugate (PCV13) vaccine. One dose is recommended after age 42. Pneumococcal polysaccharide (PPSV23) vaccine. One dose is recommended after age 59. Talk to your health care provider about which screenings and vaccines you need and how often you need them. This information is not intended to replace advice given to you by your health care provider. Make sure you discuss any questions you have with your health care provider. Document Released: 04/17/2015 Document Revised: 12/09/2015 Document Reviewed: 01/20/2015 Elsevier Interactive Patient Education  2017 [place] Prevention in the Home Falls can cause injuries. They can happen to people of all ages. There are many things you can do to make your home safe and to help prevent falls. What can I do on the outside of my home? Regularly fix the edges of walkways and driveways and fix any cracks. Remove anything that might make you trip as you walk through a door, such as a raised step or threshold. Trim any bushes or trees on the path to your home. Use bright outdoor lighting. Clear any walking paths of anything that might make someone trip, such as rocks or tools. Regularly check to see if handrails are loose or broken. Make sure that both sides of any steps have handrails. Any raised decks and porches should have guardrails on the edges. Have any leaves, snow, or ice cleared regularly. Use sand or salt on walking paths during winter. Clean up any spills in your garage right away. This includes oil or grease spills. What can I do in the bathroom? Use night lights. Install grab bars by the toilet and in the tub and shower. Do not use towel bars as grab bars. Use non-skid mats or decals in the tub or shower. If you need to sit down in the shower, use a plastic, non-slip stool. Keep the floor  dry. Clean up any water that spills on the floor as soon as it happens. Remove soap buildup in the tub or shower regularly. Attach bath mats securely with double-sided non-slip rug tape. Do not have throw rugs and other things on the floor that can make you trip. What can I do in the bedroom? Use night lights. Make sure that you have a light by your bed that is easy to reach. Do not use any sheets or blankets that are too big for your bed. They should not hang down onto the floor. Have a firm chair that has side arms. You can use this for support while you get dressed. Do not have throw rugs and other things on the floor that can make you trip. What can I do in the kitchen? Clean up any spills right away. Avoid walking on wet floors. Keep items that you use a lot in easy-to-reach places. If you need to reach something above you, use a strong step stool that has a grab bar. Keep electrical cords out of the way. Do not use floor polish or wax that makes floors slippery. If you must use wax, use  non-skid floor wax. Do not have throw rugs and other things on the floor that can make you trip. What can I do with my stairs? Do not leave any items on the stairs. Make sure that there are handrails on both sides of the stairs and use them. Fix handrails that are broken or loose. Make sure that handrails are as long as the stairways. Check any carpeting to make sure that it is firmly attached to the stairs. Fix any carpet that is loose or worn. Avoid having throw rugs at the top or bottom of the stairs. If you do have throw rugs, attach them to the floor with carpet tape. Make sure that you have a light switch at the top of the stairs and the bottom of the stairs. If you do not have them, ask someone to add them for you. What else can I do to help prevent falls? Wear shoes that: Do not have high heels. Have rubber bottoms. Are comfortable and fit you well. Are closed at the toe. Do not wear  sandals. If you use a stepladder: Make sure that it is fully opened. Do not climb a closed stepladder. Make sure that both sides of the stepladder are locked into place. Ask someone to hold it for you, if possible. Clearly mark and make sure that you can see: Any grab bars or handrails. First and last steps. Where the edge of each step is. Use tools that help you move around (mobility aids) if they are needed. These include: Canes. Walkers. Scooters. Crutches. Turn on the lights when you go into a dark area. Replace any light bulbs as soon as they burn out. Set up your furniture so you have a clear path. Avoid moving your furniture around. If any of your floors are uneven, fix them. If there are any pets around you, be aware of where they are. Review your medicines with your doctor. Some medicines can make you feel dizzy. This can increase your chance of falling. Ask your doctor what other things that you can do to help prevent falls. This information is not intended to replace advice given to you by your health care provider. Make sure you discuss any questions you have with your health care provider. Document Released: 01/15/2009 Document Revised: 08/27/2015 Document Reviewed: 04/25/2014 Elsevier Interactive Patient Education  2017 [employer].

## 2022-04-13 ENCOUNTER — Ambulatory Visit (INDEPENDENT_AMBULATORY_CARE_PROVIDER_SITE_OTHER): Payer: Medicare Other | Admitting: Family Medicine

## 2022-04-13 ENCOUNTER — Encounter: Payer: Self-pay | Admitting: Family Medicine

## 2022-04-13 VITALS — BP 118/82 | HR 76 | Temp 97.9°F | Ht 61.75 in | Wt 162.2 lb

## 2022-04-13 DIAGNOSIS — M810 Age-related osteoporosis without current pathological fracture: Secondary | ICD-10-CM

## 2022-04-13 DIAGNOSIS — Z0000 Encounter for general adult medical examination without abnormal findings: Secondary | ICD-10-CM | POA: Diagnosis not present

## 2022-04-13 DIAGNOSIS — Z13220 Encounter for screening for lipoid disorders: Secondary | ICD-10-CM | POA: Diagnosis not present

## 2022-04-13 NOTE — Progress Notes (Unsigned)
Complete physical exam  Patient: Brandi Villarreal   DOB: [birth year]   77 y.o. Female  MRN: [id number]  Subjective:    Chief Complaint  Patient presents with   Annual Exam    Brandi Villarreal is a 77 y.o. female who presents today for a complete physical exam. She reports consuming a general diet. The patient does not participate in regular exercise at present. She generally feels well. She reports sleeping fairly well. She does not have additional problems to discuss today.    Most recent fall risk assessment:    04/13/2022   10:31 AM  Fall Risk   Falls in the past year? 0  Number falls in past yr: 0  Injury with Fall? 0  Risk for fall due to : No Fall Risks  Follow up Falls evaluation completed     Most recent depression screenings:    04/12/2022    8:39 AM 01/11/2022   11:03 AM  PHQ 2/9 Scores  PHQ - 2 Score 0 2  PHQ- 9 Score  5    {VISON DENTAL STD PSA (Optional):27386}  {History (Optional):23778}  Patient Care Team: Farrel Conners, MD as PCP - General (Family Medicine) Harriett Sine, MD as Consulting Physician (Dermatology) Ladene Artist, MD as Consulting Physician (Gastroenterology) Leandrew Koyanagi, MD as Attending Physician (Orthopedic Surgery) [facility], DO as Consulting Physician (Family Medicine) Lucretia Kern, DO as Consulting Physician (Family Medicine)   Outpatient Medications Prior to Visit  Medication Sig   acetaminophen (TYLENOL) 650 MG CR tablet Take 650 mg by mouth every 8 (eight) hours as needed for pain.   acyclovir ointment (ZOVIRAX) 5 % Apply 1 application. topically every 3 (three) hours as needed (rash).   alendronate (FOSAMAX) 70 MG tablet Take 70 mg by mouth every Tuesday.   LUTEIN PO Take 1 tablet by mouth once a week.   methocarbamol (ROBAXIN) 500 MG tablet Take 1-2 tablets (500-1,000 mg total) by mouth 2 (two) times daily as needed for muscle spasms.   Multiple Vitamin (MULTIVITAMIN WITH  MINERALS) TABS tablet Take 1 tablet by mouth 3 (three) times a week.   Omega-3 Fatty Acids (OMEGA 3 PO) Take 15 mLs by mouth in the morning.   Sodium Chloride-Xylitol (XLEAR SINUS CARE SPRAY NA) Place 1-2 sprays into the nose 3 (three) times daily as needed (congestion.).   Vitamin D-Vitamin K (K2 PLUS D3 PO) Take 10 drops by mouth in the morning. (Liquid)   No facility-administered medications prior to visit.    Review of Systems  HENT:  Negative for hearing loss.   Eyes:  Negative for blurred vision.  Respiratory:  Negative for shortness of breath.   Cardiovascular:  Negative for chest pain.  Gastrointestinal: Negative.   Genitourinary: Negative.   Musculoskeletal:  Negative for back pain.  Neurological:  Negative for headaches.  Psychiatric/Behavioral:  Negative for depression.   All other systems reviewed and are negative.         Objective:     BP 118/82 (BP Location: Right Arm, Patient Position: Sitting, Cuff Size: Normal)   Pulse 76   Temp 97.9 F (36.6 C) (Oral)   Ht 5' 1.75" (1.568 m)   Wt 162 lb 3.2 oz (73.6 kg)   SpO2 97%   BMI 29.91 kg/m  {Vitals History (Optional):23777}  Physical Exam Vitals reviewed.  Constitutional:      Appearance: Normal appearance. She is well-groomed and normal weight.  Eyes:  Conjunctiva/sclera: Conjunctivae normal.  Neck:     Thyroid: No thyromegaly.  Cardiovascular:     Rate and Rhythm: Normal rate and regular rhythm.     Pulses: Normal pulses.     Heart sounds: S1 normal and S2 normal.  Pulmonary:     Effort: Pulmonary effort is normal.     Breath sounds: Normal breath sounds and air entry.  Abdominal:     General: Bowel sounds are normal.  Musculoskeletal:     Right lower leg: No edema.     Left lower leg: No edema.  Neurological:     Mental Status: She is alert and oriented to person, place, and time. Mental status is at baseline.     Gait: Gait is intact.  Psychiatric:        Mood and Affect: Mood and affect  normal.        Speech: Speech normal.        Behavior: Behavior normal.        Judgment: Judgment normal.      No results found for any visits on 04/13/22. {Show previous labs (optional):23779}    Assessment & Plan:    Routine Health Maintenance and Physical Exam  Immunization History  Administered Date(s) Administered   PFIZER(Purple Top)SARS-COV-2 Vaccination 05/26/2019, 06/25/2019   Td 07/26/2016    Health Maintenance  Topic Date Due   Zoster Vaccines- Shingrix (1 of 2) 04/13/2022 (Originally 01/20/1965)   COVID-19 Vaccine (3 - Pfizer risk series) 04/28/2022 (Originally 07/23/2019)   Pneumonia Vaccine 26+ Years old (1 - PCV) 04/13/2023 (Originally 01/21/2011)   INFLUENZA VACCINE  11/30/2025 (Originally 11/02/2021)   Medicare Annual Wellness (AWV)  04/13/2023   DTaP/Tdap/Td (2 - Tdap) 07/27/2026   DEXA SCAN  Completed   Hepatitis C Screening  Completed   HPV VACCINES  Aged Out   COLONOSCOPY (Pts 45-67yr Insurance coverage will need to be confirmed)  Discontinued    Discussed health benefits of physical activity, and encouraged her to engage in regular exercise appropriate for her age and condition.  Problem List Items Addressed This Visit       Unprioritized   Osteoporosis   Relevant Orders   Vitamin D, 25-hydroxy   Other Visit Diagnoses     Encounter for routine adult health examination without abnormal findings    -  Primary   Relevant Orders   CMP   CBC (no diff)   Lipid screening       Relevant Orders   Lipid Panel      No follow-ups on file.     BFarrel Conners MD

## 2022-04-13 NOTE — Patient Instructions (Signed)

## 2022-04-14 ENCOUNTER — Ambulatory Visit
Admission: RE | Admit: 2022-04-14 | Discharge: 2022-04-14 | Disposition: A | Discharge status: Home / Self Care | Payer: Medicare Other | Source: Ambulatory Visit | Attending: Diagnostic Neuroimaging | Admitting: Diagnostic Neuroimaging

## 2022-04-14 ENCOUNTER — Other Ambulatory Visit (INDEPENDENT_AMBULATORY_CARE_PROVIDER_SITE_OTHER): Payer: Medicare Other

## 2022-04-14 DIAGNOSIS — R413 Other amnesia: Secondary | ICD-10-CM

## 2022-04-14 DIAGNOSIS — M810 Age-related osteoporosis without current pathological fracture: Secondary | ICD-10-CM | POA: Diagnosis not present

## 2022-04-14 DIAGNOSIS — Z0000 Encounter for general adult medical examination without abnormal findings: Secondary | ICD-10-CM

## 2022-04-14 DIAGNOSIS — Z13220 Encounter for screening for lipoid disorders: Secondary | ICD-10-CM | POA: Diagnosis not present

## 2022-04-14 DIAGNOSIS — G3184 Mild cognitive impairment, so stated: Secondary | ICD-10-CM

## 2022-04-14 LAB — LIPID PANEL
Cholesterol: 183 mg/dL (ref 0–200)
HDL: 66 mg/dL (ref >39.00)
LDL Cholesterol: 92 mg/dL (ref 0–99)
NonHDL: 116.71
Total CHOL/HDL Ratio: 3
Triglycerides: 123 mg/dL (ref 0.0–149.0)
VLDL: 24.6 mg/dL (ref 0.0–40.0)

## 2022-04-14 LAB — COMPREHENSIVE METABOLIC PANEL
ALT: 14 U/L (ref 0–35)
AST: 18 U/L (ref 0–37)
Albumin: 4.2 g/dL (ref 3.5–5.2)
Alkaline Phosphatase: 104 U/L (ref 39–117)
BUN: 16 mg/dL (ref 6–23)
CO2: 30 mEq/L (ref 19–32)
Calcium: 9 mg/dL (ref 8.4–10.5)
Chloride: 103 mEq/L (ref 96–112)
Creatinine, Ser: 0.77 mg/dL (ref 0.40–1.20)
GFR: 75.03 mL/min (ref >60.00)
Glucose, Bld: 93 mg/dL (ref 70–99)
Potassium: 4.1 mEq/L (ref 3.5–5.1)
Sodium: 141 mEq/L (ref 135–145)
Total Bilirubin: 0.3 mg/dL (ref 0.2–1.2)
Total Protein: 7.3 g/dL (ref 6.0–8.3)

## 2022-04-14 LAB — CBC
HCT: 37.8 % (ref 36.0–46.0)
Hemoglobin: 12 g/dL (ref 12.0–15.0)
MCHC: 31.6 g/dL (ref 30.0–36.0)
MCV: 81.2 fl (ref 78.0–100.0)
Platelets: 315 10*3/uL (ref 150.0–400.0)
RBC: 4.66 Mil/uL (ref 3.87–5.11)
RDW: 17 % — ABNORMAL HIGH (ref 11.5–15.5)
WBC: 5.3 10*3/uL (ref 4.0–10.5)

## 2022-04-14 LAB — VITAMIN D 25 HYDROXY (VIT D DEFICIENCY, FRACTURES): VITD: 38.27 ng/mL (ref 30.00–100.00)

## 2022-04-14 MED ORDER — GADOPICLENOL 0.5 MMOL/ML IV SOLN
7.0000 mL | Freq: Once | INTRAVENOUS | Status: AC | PRN
  Administered 2022-04-14: 7 mL via INTRAVENOUS

## 2022-04-18 ENCOUNTER — Telehealth: Payer: Self-pay

## 2022-04-18 NOTE — Telephone Encounter (Signed)
-----  Message from Penni Bombard, MD sent at 04/18/2022  2:25 PM EST ----- Unremarkable imaging results. No major change or new findings. Continue current plan. -VRP

## 2022-05-10 ENCOUNTER — Ambulatory Visit: Payer: Medicare Other

## 2022-05-10 ENCOUNTER — Ambulatory Visit
Admission: RE | Admit: 2022-05-10 | Discharge: 2022-05-10 | Disposition: A | Discharge status: Home / Self Care | Payer: Medicare Other | Source: Ambulatory Visit | Attending: Obstetrics and Gynecology | Admitting: Obstetrics and Gynecology

## 2022-05-10 DIAGNOSIS — N644 Mastodynia: Secondary | ICD-10-CM

## 2022-05-17 ENCOUNTER — Encounter: Payer: Self-pay | Admitting: Family Medicine

## 2022-05-17 ENCOUNTER — Ambulatory Visit (INDEPENDENT_AMBULATORY_CARE_PROVIDER_SITE_OTHER): Payer: Medicare Other | Admitting: Family Medicine

## 2022-05-17 VITALS — BP 122/80 | HR 70 | Temp 97.5°F | Ht 61.75 in | Wt 165.3 lb

## 2022-05-17 DIAGNOSIS — L0293 Carbuncle, unspecified: Secondary | ICD-10-CM

## 2022-05-17 MED ORDER — DOXYCYCLINE HYCLATE 100 MG PO TABS: 100.0000 mg | ORAL_TABLET | Freq: Two times a day (BID) | ORAL | 0 refills | Status: AC

## 2022-05-17 NOTE — Progress Notes (Signed)
Acute Office Visit  Subjective:     Patient ID: Brandi Villarreal, female    DOB: Jun 18, 1945, 77 y.o.   MRN: [id number]  Chief Complaint  Patient presents with   Cyst    Patient complains of swollen lump right pelvis/vaginal area x4 days    HPI Patient is in today for a new lump that has been there for about a week. States that it is painful and red,  states it is the size of the tip of her finger.  Review of Systems  Constitutional:  Negative for chills and fever.  All other systems reviewed and are negative.       Objective:    BP 122/80 (BP Location: Left Arm, Patient Position: Sitting, Cuff Size: Large)   Pulse 70   Temp (!) 97.5 F (36.4 C) (Oral)   Ht 5' 1.75" (1.568 m)   Wt 165 lb 4.8 oz (75 kg)   SpO2 96%   BMI 30.48 kg/m    Physical Exam Exam conducted with a chaperone present.  Constitutional:      Appearance: Normal appearance. She is normal weight.  Genitourinary:    Exam position: Knee-chest position.       Comments: Two areas of induration and redness on the right inner thigh/ perineal area indicated in the picture. There was some nodularity to both lesions and they were very tender to palpation Neurological:     Mental Status: She is alert.     No results found for any visits on 05/17/22. I & D  Date/Time: 05/18/2022 3:44 PM  Performed by: Karie Georges, MD Authorized by: Karie Georges, MD   Consent:    Consent obtained:  Verbal   Consent given by:  Patient and spouse   Risks, benefits, and alternatives were discussed: yes     Risks discussed:  Bleeding, incomplete drainage and infection   Alternatives discussed:  No treatment Location:    Type:  Abscess   Size:  1 cm lesion and 1.5 cm lesion   Location:  Anogenital   Anogenital location:  Gluteal cleft Pre-procedure details:    Skin preparation:  Povidone-iodine Sedation:    Sedation type:  None Anesthesia:    Anesthesia method:  Local infiltration   Local anesthetic:   Lidocaine 1% WITH epi Procedure type:    Complexity:  Simple Procedure details:    Needle aspiration: no     Incision types:  Stab incision   Incision depth:  Dermal   Scalpel blade:  11   Drainage:  Bloody   Drainage amount:  Scant   Wound treatment:  Wound left open   Packing materials:  None Post-procedure details:    Procedure completion:  Tolerated well, no immediate complications       Assessment & Plan:   Problem List Items Addressed This Visit   None Visit Diagnoses     Carbuncle of skin and subcutaneous tissue    -  Primary   Relevant Medications   doxycycline (VIBRA-TABS) 100 MG tablet     Very little purulent drainage was extracted, I believe these represent inflamed epidermal cysts. I advised hot compresses to encourage drainage and patient will start doxycycline 100 mg BID for 10 days. Patient is to let us know if she has any reaction to the doxycycline, she has mutliple allergies to other antibiotics.  Meds ordered this encounter  Medications   doxycycline (VIBRA-TABS) 100 MG tablet    Sig: Take 1 tablet (100 mg  total) by mouth 2 (two) times daily for 10 days.    Dispense:  20 tablet    Refill:  0    No follow-ups on file.  Karie Georges, MD

## 2022-08-17 ENCOUNTER — Other Ambulatory Visit: Payer: Self-pay | Admitting: Neurology

## 2023-01-12 ENCOUNTER — Ambulatory Visit: Payer: Medicare Other | Admitting: Adult Health

## 2023-01-12 ENCOUNTER — Other Ambulatory Visit: Payer: Self-pay | Admitting: Adult Health

## 2023-01-12 ENCOUNTER — Encounter: Payer: Self-pay | Admitting: Adult Health

## 2023-01-12 VITALS — BP 120/72 | HR 87 | Temp 98.1°F | Ht 61.75 in | Wt 167.0 lb

## 2023-01-12 DIAGNOSIS — R053 Chronic cough: Secondary | ICD-10-CM

## 2023-01-12 DIAGNOSIS — K219 Gastro-esophageal reflux disease without esophagitis: Secondary | ICD-10-CM

## 2023-01-12 DIAGNOSIS — R682 Dry mouth, unspecified: Secondary | ICD-10-CM

## 2023-01-12 MED ORDER — PANTOPRAZOLE SODIUM 40 MG PO TBEC: 40.0000 mg | DELAYED_RELEASE_TABLET | Freq: Every day | ORAL | 1 refills | Status: DC

## 2023-01-12 NOTE — Progress Notes (Signed)
Subjective:    Patient ID: Brandi Villarreal, female    DOB: 18-Jul-1945, 77 y.o.   MRN: [id number]  HPI 77 year old female who  has a past medical history of Cholelithiasis, Colitis (?1975), Hemorrhoids, Lichen sclerosus, Mitral valve prolapse, Osteoporosis, Right leg weakness, and Varicose veins.  She is a patient of Dr. Casimiro Needle who I am seeing today for an acute issue. She is with her husband today.  She has some mild cognitive impairment so her husband helps supplement the history.  She has had a dry cough x 1 month as well as dry mouth.  Has had this issue in the past but eventually stopped.  She denies fevers, chills, shortness of breath, wheezing, or sinus drainage.  She does report having some epigastric abdominal pain and her husband reports that she takes a Tums each night.  Has not had any nausea, vomiting, or diarrhea.   Review of Systems See HPI   Past Medical History:  Diagnosis Date   Cholelithiasis    rare symptoms, declined surgery   Colitis ?1975   Hemorrhoids    Lichen sclerosus    sees gyn for management   Mitral valve prolapse    Osteoporosis    sees gyn   Right leg weakness    Varicose veins     Social History   Socioeconomic History   Marital status: Married    Spouse name: [name]   Number of children: Not on file   Years of education: Not on file   Highest education level: Not on file  Occupational History    Comment: retired IT consultant  Tobacco Use   Smoking status: Never   Smokeless tobacco: Never  Vaping Use   Vaping status: Never Used  Substance and Sexual Activity   Alcohol use: No    Comment: wine-rare   Drug use: No   Sexual activity: Not on file  Other Topics Concern   Not on file  Social History Narrative   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      Home Situation: lives with husband      Spiritual Beliefs: Christian      Lifestyle: no regular exercise; diet is good      Right handed       MB RN 11/04/2020    Social  Determinants of Health   Financial Resource Strain: Low Risk  (04/12/2022)   Overall Financial Resource Strain (CARDIA)    Difficulty of Paying Living Expenses: Not hard at all  Food Insecurity: No Food Insecurity (05/13/2020)   Hunger Vital Sign    Worried About Running Out of Food in the Last Year: Never true    Ran Out of Food in the Last Year: Never true  Transportation Needs: No Transportation Needs (04/12/2022)   PRAPARE - Administrator, Civil Service (Medical): No    Lack of Transportation (Non-Medical): No  Physical Activity: Sufficiently Active (05/13/2020)   Exercise Vital Sign    Days of Exercise per Week: 7 days    Minutes of Exercise per Session: 60 min  Stress: No Stress Concern Present (04/12/2022)   [employer] of Occupational Health - Occupational Stress Questionnaire    Feeling of Stress : Not at all  Social Connections: Socially Integrated (04/12/2022)   Social Connection and Isolation Panel [NHANES]    Frequency of Communication with Friends and Family: More than three times a week    Frequency of Social Gatherings with Friends and Family: More  than three times a week    Attends Religious Services: More than 4 times per year    Active Member of Clubs or Organizations: Yes    Attends Banker Meetings: More than 4 times per year    Marital Status: Married  Catering manager Violence: Not At Risk (05/13/2020)   Humiliation, Afraid, Rape, and Kick questionnaire    Fear of Current or Ex-Partner: No    Emotionally Abused: No    Physically Abused: No    Sexually Abused: No    Past Surgical History:  Procedure Laterality Date   BACK SURGERY  06/28/2021   by Dr Yetta Barre   jaw resection     RHINOPLASTY     TONSILLECTOMY     TUBAL LIGATION     bilteral    Family History  Problem Relation Age of Onset   Colon cancer Brother 59       died at age 63   Pancreatic cancer Brother    Atrial fibrillation Mother    Alzheimer's disease Father     Healthy Sister    Healthy Sister    Other Sister        immune issues?   Breast cancer Sister 71   Healthy Brother     Allergies  Allergen Reactions   Iodine     Anaphylaxis following IVP dye   Penicillins     Has patient had a PCN reaction causing immediate rash, facial/tongue/throat swelling, SOB or lightheadedness with hypotension: Yes Has patient had a PCN reaction causing severe rash involving mucus membranes or skin necrosis: No Has patient had a PCN reaction that required hospitalization: No Has patient had a PCN reaction occurring within the last 10 years: No If all of the above answers are "NO", then may proceed with Cephalosporin use.    Morphine     Mental status changes   Sulfonamide Derivatives     itching   Cortisone Itching    Due to eye disorder   Doxycycline Hives    Ringing in ears   Fluticasone Propionate     ? Reaction-advised not to take per eye doctor   Hydrocodone Itching   Other Nausea And Vomiting    Potato    Current Outpatient Medications on File Prior to Visit  Medication Sig Dispense Refill   acetaminophen (TYLENOL) 650 MG CR tablet Take 650 mg by mouth every 8 (eight) hours as needed for pain.     acyclovir ointment (ZOVIRAX) 5 % Apply 1 application. topically every 3 (three) hours as needed (rash).     alendronate (FOSAMAX) 70 MG tablet Take 70 mg by mouth every Tuesday.     LUTEIN PO Take 1 tablet by mouth once a week.     Multiple Vitamin (MULTIVITAMIN WITH MINERALS) TABS tablet Take 1 tablet by mouth 3 (three) times a week.     Omega-3 Fatty Acids (OMEGA 3 PO) Take 15 mLs by mouth in the morning.     Sodium Chloride-Xylitol (XLEAR SINUS CARE SPRAY NA) Place 1-2 sprays into the nose 3 (three) times daily as needed (congestion.).     Vitamin D-Vitamin K (K2 PLUS D3 PO) Take 10 drops by mouth in the morning. (Liquid)     No current facility-administered medications on file prior to visit.    BP 120/72   Pulse 87   Temp 98.1 F (36.7 C)  (Oral)   Ht 5' 1.75" (1.568 m)   Wt 167 lb (75.8 kg)   SpO2  97%   BMI 30.79 kg/m       Objective:   Physical Exam Vitals and nursing note reviewed.  Constitutional:      Appearance: Normal appearance.  HENT:     Mouth/Throat:     Mouth: Mucous membranes are moist.     Tongue: No lesions.     Pharynx: Oropharynx is clear. Uvula midline.  Cardiovascular:     Rate and Rhythm: Normal rate and regular rhythm.     Pulses: Normal pulses.     Heart sounds: Normal heart sounds.  Pulmonary:     Effort: Pulmonary effort is normal.     Breath sounds: Normal breath sounds.  Abdominal:     General: Abdomen is flat. Bowel sounds are normal.     Palpations: Abdomen is soft.     Tenderness: There is abdominal tenderness in the epigastric area.  Musculoskeletal:        General: Normal range of motion.  Skin:    General: Skin is warm and dry.  Neurological:     General: No focal deficit present.     Mental Status: She is alert and oriented to person, place, and time.  Psychiatric:        Mood and Affect: Mood normal.        Behavior: Behavior normal.        Thought Content: Thought content normal.        Judgment: Judgment normal.        Assessment & Plan:  1. Chronic cough -I think chronic cough she is experiencing coupled with dry mouth is due to acid reflux.  Will treat her with Protonix 40 mg daily and have her stop after 30 days.  If symptoms continue after 2 weeks then I will have her follow-up in the office with myself or her PCP.  2. Dry mouth   3. Gastroesophageal reflux disease without esophagitis  - pantoprazole (PROTONIX) 40 MG tablet; Take 1 tablet (40 mg total) by mouth daily.  Dispense: 30 tablet; Refill: 1  Shirline Frees, NP  Time spent with patient today was 31 minutes which consisted of chart review, discussing cough, dry mouth and gerd, work up, treatment answering questions and documentation.

## 2023-03-14 ENCOUNTER — Ambulatory Visit (INDEPENDENT_AMBULATORY_CARE_PROVIDER_SITE_OTHER): Payer: Medicare Other | Admitting: Family Medicine

## 2023-03-14 ENCOUNTER — Encounter: Payer: Self-pay | Admitting: Family Medicine

## 2023-03-14 ENCOUNTER — Ambulatory Visit: Payer: Medicare Other

## 2023-03-14 VITALS — BP 122/60 | HR 70 | Temp 97.5°F | Ht 61.75 in | Wt 167.3 lb

## 2023-03-14 DIAGNOSIS — R0989 Other specified symptoms and signs involving the circulatory and respiratory systems: Secondary | ICD-10-CM

## 2023-03-14 NOTE — Progress Notes (Signed)
Established Patient Office Visit  Subjective   Patient ID: Brandi Villarreal, female    DOB: Nov 24, 1945  Age: 77 y.o. MRN: [id number]  Chief Complaint  Patient presents with   Cough    Productive with clear sputum x2 months    Patient is here for continued cough. It started in October, more of a dry cough, no fever or chills, no chest pain or SOB. Pt reports that she feels "dry all the time" is drinking a lot of water and eating ice. States she takes cough drops at nighttime, states that they do help. States that her mouth is just feeling very dry. Was given protonix in October --initially thought it was helping but really it hasn't helped.   We looked back and reviewed her last CXR from 01/2022 -- she had hyperinflation in the lungs and possible COPD-- she reports her father was an extremely heavy smoker and he smoked constantly when she was a child.     Current Outpatient Medications  Medication Instructions   acetaminophen (TYLENOL) 650 mg, Oral, Every 8 hours PRN   acyclovir ointment (ZOVIRAX) 5 % 1 application , Topical, Every  3 hours PRN   alendronate (FOSAMAX) 70 mg, Oral, Every Tue   LUTEIN PO 1 tablet, Oral, Weekly   Multiple Vitamin (MULTIVITAMIN WITH MINERALS) TABS tablet 1 tablet, Oral, 3 times weekly   Omega-3 Fatty Acids (OMEGA 3 PO) 15 mLs, Oral, Every morning   pantoprazole (PROTONIX) 40 mg, Oral, Daily   Sodium Chloride-Xylitol (XLEAR SINUS CARE SPRAY NA) 1-2 sprays, Nasal, 3 times daily PRN   Vitamin D-Vitamin K (K2 PLUS D3 PO) 10 drops, Oral, Every morning, (Liquid)    Patient Active Problem List   Diagnosis Date Noted   S/P lumbar fusion 06/28/2021   Histoplasmosis 08/21/2019   Chronic left shoulder pain 08/03/2017   Aortic atherosclerosis (HCC) 07/18/2017   Cholelithiasis 03/20/2012   Osteoporosis 08/09/2006      Review of Systems  All other systems reviewed and are negative.     Objective:     BP 122/60 (BP Location: Left Arm, Patient Position:  Sitting, Cuff Size: Normal)   Pulse 70   Temp (!) 97.5 F (36.4 C) (Oral)   Ht 5' 1.75" (1.568 m)   Wt 167 lb 4.8 oz (75.9 kg)   SpO2 98%   BMI 30.85 kg/m    Physical Exam Vitals reviewed.  Constitutional:      Appearance: Normal appearance.  HENT:     Nose: Nose normal. No rhinorrhea.     Mouth/Throat:     Mouth: Mucous membranes are moist.     Pharynx: No posterior oropharyngeal erythema.  Cardiovascular:     Rate and Rhythm: Normal rate and regular rhythm.     Heart sounds: Normal heart sounds.  Pulmonary:     Effort: Pulmonary effort is normal.     Breath sounds: Normal breath sounds.  Neurological:     Mental Status: She is alert and oriented to person, place, and time. Mental status is at baseline.  Psychiatric:        Mood and Affect: Mood normal.        Behavior: Behavior normal.      No results found for any visits on 03/14/23.    The 10-year ASCVD risk score (Arnett DK, et al., 2019) is: 18.4%    Assessment & Plan:  Hyperinflation of lungs -     DG Chest 2 View; Future   I think that  this could be related to the chronic cough-- I advised we check another CXR to compare to previous to see if the lungs still appear hyperinflated-- pt had heavy exposure to second hand smoke when she was young. I gave her a sample of Breztri inhaler and explained its use to her. I advised that she stop the omeprazole since it was not helping with her cough. Oropharynx is clear and her mucus membranes appear moist on exam. RTC in 1 month for her annual physical.   Return in about 1 month (around 04/14/2023) for annual physical exam.    Karie Georges, MD

## 2023-03-14 NOTE — Patient Instructions (Signed)
Schedule bathroom breaks -- every 3 hours Reduce constipation-- fiber, miralax, probiotics Kegel exercises several times a day

## 2023-03-16 ENCOUNTER — Encounter: Payer: Self-pay | Admitting: Family Medicine

## 2023-03-22 ENCOUNTER — Encounter: Payer: Self-pay | Admitting: Family Medicine

## 2023-03-22 DIAGNOSIS — J432 Centrilobular emphysema: Secondary | ICD-10-CM

## 2023-03-23 MED ORDER — BREZTRI AEROSPHERE 160-9-4.8 MCG/ACT IN AERO: 2.0000 | INHALATION_SPRAY | Freq: Two times a day (BID) | RESPIRATORY_TRACT | 11 refills | Status: DC

## 2023-04-21 ENCOUNTER — Ambulatory Visit (INDEPENDENT_AMBULATORY_CARE_PROVIDER_SITE_OTHER): Payer: Medicare Other | Admitting: Family Medicine

## 2023-04-21 VITALS — BP 130/60 | HR 70 | Temp 97.7°F | Ht 62.0 in | Wt 164.1 lb

## 2023-04-21 DIAGNOSIS — Z13220 Encounter for screening for lipoid disorders: Secondary | ICD-10-CM | POA: Diagnosis not present

## 2023-04-21 DIAGNOSIS — Z0000 Encounter for general adult medical examination without abnormal findings: Secondary | ICD-10-CM

## 2023-04-21 DIAGNOSIS — D509 Iron deficiency anemia, unspecified: Secondary | ICD-10-CM

## 2023-04-21 DIAGNOSIS — Z23 Encounter for immunization: Secondary | ICD-10-CM | POA: Diagnosis not present

## 2023-04-21 DIAGNOSIS — Z131 Encounter for screening for diabetes mellitus: Secondary | ICD-10-CM | POA: Diagnosis not present

## 2023-04-21 DIAGNOSIS — M810 Age-related osteoporosis without current pathological fracture: Secondary | ICD-10-CM | POA: Diagnosis not present

## 2023-04-21 LAB — LIPID PANEL
Cholesterol: 167 mg/dL (ref 0–200)
HDL: 63.6 mg/dL (ref >39.00)
LDL Cholesterol: 59 mg/dL (ref 0–99)
NonHDL: 103.36
Total CHOL/HDL Ratio: 3
Triglycerides: 222 mg/dL — ABNORMAL HIGH (ref 0.0–149.0)
VLDL: 44.4 mg/dL — ABNORMAL HIGH (ref 0.0–40.0)

## 2023-04-21 LAB — HEMOGLOBIN A1C: Hgb A1c MFr Bld: 6.1 % (ref 4.6–6.5)

## 2023-04-21 LAB — CBC WITH DIFFERENTIAL/PLATELET
Basophils Absolute: 0.1 10*3/uL (ref 0.0–0.1)
Basophils Relative: 1 % (ref 0.0–3.0)
Eosinophils Absolute: 0.2 10*3/uL (ref 0.0–0.7)
Eosinophils Relative: 2.4 % (ref 0.0–5.0)
HCT: 30.3 % — ABNORMAL LOW (ref 36.0–46.0)
Hemoglobin: 9 g/dL — ABNORMAL LOW (ref 12.0–15.0)
Lymphocytes Relative: 26.1 % (ref 12.0–46.0)
Lymphs Abs: 1.8 10*3/uL (ref 0.7–4.0)
MCHC: 29.8 g/dL — ABNORMAL LOW (ref 30.0–36.0)
MCV: 68.8 fL — ABNORMAL LOW (ref 78.0–100.0)
Monocytes Absolute: 0.5 10*3/uL (ref 0.1–1.0)
Monocytes Relative: 7.6 % (ref 3.0–12.0)
Neutro Abs: 4.3 10*3/uL (ref 1.4–7.7)
Neutrophils Relative %: 62.9 % (ref 43.0–77.0)
Platelets: 381 10*3/uL (ref 150.0–400.0)
RBC: 4.4 Mil/uL (ref 3.87–5.11)
RDW: 19.6 % — ABNORMAL HIGH (ref 11.5–15.5)
WBC: 6.8 10*3/uL (ref 4.0–10.5)

## 2023-04-21 LAB — COMPREHENSIVE METABOLIC PANEL
ALT: 12 U/L (ref 0–35)
AST: 18 U/L (ref 0–37)
Albumin: 4.4 g/dL (ref 3.5–5.2)
Alkaline Phosphatase: 107 U/L (ref 39–117)
BUN: 19 mg/dL (ref 6–23)
CO2: 25 meq/L (ref 19–32)
Calcium: 8.9 mg/dL (ref 8.4–10.5)
Chloride: 101 meq/L (ref 96–112)
Creatinine, Ser: 0.83 mg/dL (ref 0.40–1.20)
GFR: 68.08 mL/min (ref >60.00)
Glucose, Bld: 221 mg/dL — ABNORMAL HIGH (ref 70–99)
Potassium: 4 meq/L (ref 3.5–5.1)
Sodium: 135 meq/L (ref 135–145)
Total Bilirubin: 0.4 mg/dL (ref 0.2–1.2)
Total Protein: 7.6 g/dL (ref 6.0–8.3)

## 2023-04-21 LAB — VITAMIN D 25 HYDROXY (VIT D DEFICIENCY, FRACTURES): VITD: 28.51 ng/mL — ABNORMAL LOW (ref 30.00–100.00)

## 2023-04-21 NOTE — Patient Instructions (Signed)

## 2023-04-21 NOTE — Progress Notes (Signed)
Complete physical exam  Patient: Brandi Villarreal   DOB: 1945/10/13   78 y.o. Female  MRN: [id number]  Subjective:    Chief Complaint  Patient presents with   Annual Exam    Brandi Villarreal is a 78 y.o. female who presents today for a complete physical exam. She reports consuming a general diet. Exercise is limited by orthopedic condition(s): chronic back pain. She generally feels well. She reports sleeping well. She does not have additional problems to discuss today.    Most recent fall risk assessment:    04/21/2023   11:26 AM  Fall Risk   Falls in the past year? 0  Number falls in past yr: 0  Injury with Fall? 0  Risk for fall due to : Impaired mobility;Impaired balance/gait  Follow up Falls evaluation completed     Most recent depression screenings:    04/21/2023   11:26 AM 04/12/2022    8:39 AM  PHQ 2/9 Scores  PHQ - 2 Score 0 0  PHQ- 9 Score 0     Vision:Within last year and Dental: No current dental problems and Receives regular dental care  Patient Active Problem List   Diagnosis Date Noted   S/P lumbar fusion 06/28/2021   Histoplasmosis 08/21/2019   Chronic left shoulder pain 08/03/2017   Aortic atherosclerosis (HCC) 07/18/2017   Cholelithiasis 03/20/2012   Osteoporosis 08/09/2006      Patient Care Team: Karie Georges, MD as PCP - General (Family Medicine) Aris Lot, MD as Consulting Physician (Dermatology) Meryl Dare, MD (Inactive) as Consulting Physician (Gastroenterology) Tarry Kos, MD as Attending Physician (Orthopedic Surgery) [facility], [facility], DO as Consulting Physician (Family Medicine) Terressa Koyanagi, DO as Consulting Physician (Family Medicine)   Outpatient Medications Prior to Visit  Medication Sig   acetaminophen (TYLENOL) 650 MG CR tablet Take 650 mg by mouth every 8 (eight) hours as needed for pain.   acyclovir ointment (ZOVIRAX) 5 % Apply 1 application. topically every 3 (three)  hours as needed (rash).   alendronate (FOSAMAX) 70 MG tablet Take 70 mg by mouth every Tuesday.   Budeson-Glycopyrrol-Formoterol (BREZTRI AEROSPHERE) 160-9-4.8 MCG/ACT AERO Inhale 2 puffs into the lungs 2 (two) times daily.   LUTEIN PO Take 1 tablet by mouth once a week.   Multiple Vitamin (MULTIVITAMIN WITH MINERALS) TABS tablet Take 1 tablet by mouth 3 (three) times a week.   Omega-3 Fatty Acids (OMEGA 3 PO) Take 15 mLs by mouth in the morning.   Sodium Chloride-Xylitol (XLEAR SINUS CARE SPRAY NA) Place 1-2 sprays into the nose 3 (three) times daily as needed (congestion.).   Vitamin D-Vitamin K (K2 PLUS D3 PO) Take 10 drops by mouth in the morning. (Liquid)   [DISCONTINUED] pantoprazole (PROTONIX) 40 MG tablet Take 1 tablet (40 mg total) by mouth daily.   No facility-administered medications prior to visit.    Review of Systems  All other systems reviewed and are negative.      Objective:     BP 130/60   Pulse 70   Temp 97.7 F (36.5 C) (Oral)   Ht 5\' 2"  (1.575 m)   Wt 164 lb 1.6 oz (74.4 kg)   SpO2 96%   BMI 30.01 kg/m    Physical Exam Vitals reviewed.  Constitutional:      Appearance: Normal appearance. She is well-groomed and overweight.  HENT:     Right Ear: Tympanic membrane and ear canal normal.  Left Ear: Tympanic membrane and ear canal normal.     Mouth/Throat:     Mouth: Mucous membranes are moist.     Pharynx: No posterior oropharyngeal erythema.  Eyes:     Conjunctiva/sclera: Conjunctivae normal.  Neck:     Thyroid: No thyromegaly.  Cardiovascular:     Rate and Rhythm: Normal rate and regular rhythm.     Pulses: Normal pulses.     Heart sounds: S1 normal and S2 normal.  Pulmonary:     Effort: Pulmonary effort is normal.     Breath sounds: Normal breath sounds and air entry.  Abdominal:     General: Abdomen is flat. Bowel sounds are normal.     Palpations: Abdomen is soft.  Musculoskeletal:     Right lower leg: No edema.     Left lower leg: No  edema.  Lymphadenopathy:     Cervical: No cervical adenopathy.  Neurological:     Mental Status: She is alert and oriented to person, place, and time. Mental status is at baseline.     Gait: Gait is intact.  Psychiatric:        Mood and Affect: Mood and affect normal.        Speech: Speech normal.        Behavior: Behavior normal.        Judgment: Judgment normal.      No results found for any visits on 04/21/23.     Assessment & Plan:    Routine Health Maintenance and Physical Exam  Immunization History  Administered Date(s) Administered   PFIZER(Purple Top)SARS-COV-2 Vaccination 05/26/2019, 06/25/2019, 01/13/2020   Td 07/26/2016    Health Maintenance  Topic Date Due   Pneumonia Vaccine 11+ Years old (1 of 2 - PCV) Never done   Zoster Vaccines- Shingrix (1 of 2) Never done   COVID-19 Vaccine (4 - 2024-25 season) 12/04/2022   Medicare Annual Wellness (AWV)  04/13/2023   INFLUENZA VACCINE  07/03/2023 (Originally 11/03/2022)   DTaP/Tdap/Td (2 - Tdap) 07/27/2026   DEXA SCAN  Completed   Hepatitis C Screening  Completed   HPV VACCINES  Aged Out   Colonoscopy  Discontinued    Discussed health benefits of physical activity, and encouraged her to engage in regular exercise appropriate for her age and condition.  Age-related osteoporosis without current pathological fracture -     VITAMIN D 25 Hydroxy (Vit-D Deficiency, Fractures)  Lipid screening -     Lipid panel  Diabetes mellitus screening -     Hemoglobin A1c  Routine general medical examination at a health care facility -     Comprehensive metabolic panel -     CBC with Differential/Platelet  Immunization due -     Pneumococcal conjugate vaccine 20-valent  Normal physical exam findings today, I counseled the patient on posture exercises and handouts were given. Labs ordered for annual surveillance, HM reviewed, she is agreeable to the Prevnar 20 today which I also counseled her on.   Return in 6 months (on  10/19/2023).     Karie Georges, MD

## 2023-04-26 ENCOUNTER — Encounter: Payer: Self-pay | Admitting: Family Medicine

## 2023-04-26 ENCOUNTER — Ambulatory Visit: Payer: Medicare Other

## 2023-04-26 VITALS — Ht 62.0 in | Wt 164.0 lb

## 2023-04-26 DIAGNOSIS — Z0000 Encounter for general adult medical examination without abnormal findings: Secondary | ICD-10-CM

## 2023-04-26 MED ORDER — IRON (FERROUS SULFATE) 325 (65 FE) MG PO TABS: 325.0000 mg | ORAL_TABLET | Freq: Every day | ORAL | 3 refills | Status: DC

## 2023-04-26 NOTE — Progress Notes (Signed)
Subjective:   Brandi Villarreal is a 78 y.o. female who presents for Medicare Annual (Subsequent) preventive examination.  Visit Complete: Virtual I connected with  Brandi Villarreal on 04/26/23 by a audio enabled telemedicine application and verified that I am speaking with the correct person using two identifiers.  Patient Location: Home  Provider Location: Home Office  I discussed the limitations of evaluation and management by telemedicine. The patient expressed understanding and agreed to proceed.  Vital Signs: Because this visit was a virtual/telehealth visit, some criteria may be missing or patient reported. Any vitals not documented were not able to be obtained and vitals that have been documented are patient reported.    Cardiac Risk Factors include: advanced age (>103men, >31 women)     Objective:    Today's Vitals   04/26/23 0959  Weight: 164 lb (74.4 kg)  Height: 5\' 2"  (1.575 m)   Body mass index is 30 kg/m.     04/26/2023   10:09 AM 04/12/2022    8:41 AM 06/28/2021    7:32 AM 06/22/2021   10:18 AM 05/12/2021    2:55 PM 12/03/2020    8:50 AM 05/13/2020    3:14 PM  Advanced Directives  Does Patient Have a Medical Advance Directive? Yes Yes Yes Yes Yes Yes Yes  Type of Estate agent of [place];Living will Healthcare Power of [place];Living will Healthcare Power of [place];Living will Healthcare Power of [place];Living will   Healthcare Power of [place];Living will  Does patient want to make changes to medical advance directive? No - Patient declined No - Patient declined No - Patient declined No - Patient declined   No - Patient declined  Copy of Healthcare Power of Attorney in Chart? Yes - validated most recent copy scanned in chart (See row information) Yes - validated most recent copy scanned in chart (See row information) No - copy requested No - copy requested   Yes - validated most recent copy scanned in chart (See row information)    Current  Medications (verified) Outpatient Encounter Medications as of 04/26/2023  Medication Sig   acetaminophen (TYLENOL) 650 MG CR tablet Take 650 mg by mouth every 8 (eight) hours as needed for pain.   acyclovir ointment (ZOVIRAX) 5 % Apply 1 application. topically every 3 (three) hours as needed (rash).   alendronate (FOSAMAX) 70 MG tablet Take 70 mg by mouth every Tuesday.   Budeson-Glycopyrrol-Formoterol (BREZTRI AEROSPHERE) 160-9-4.8 MCG/ACT AERO Inhale 2 puffs into the lungs 2 (two) times daily.   LUTEIN PO Take 1 tablet by mouth once a week.   Multiple Vitamin (MULTIVITAMIN WITH MINERALS) TABS tablet Take 1 tablet by mouth 3 (three) times a week.   Omega-3 Fatty Acids (OMEGA 3 PO) Take 15 mLs by mouth in the morning.   Sodium Chloride-Xylitol (XLEAR SINUS CARE SPRAY NA) Place 1-2 sprays into the nose 3 (three) times daily as needed (congestion.).   Vitamin D-Vitamin K (K2 PLUS D3 PO) Take 10 drops by mouth in the morning. (Liquid)   No facility-administered encounter medications on file as of 04/26/2023.    Allergies (verified) Iodine, Penicillins, Morphine, Sulfonamide derivatives, Cortisone, Doxycycline, Fluticasone propionate, Hydrocodone, and Other   History: Past Medical History:  Diagnosis Date   Cholelithiasis    rare symptoms, declined surgery   Colitis ?1975   Hemorrhoids    Lichen sclerosus    sees gyn for management   Mitral valve prolapse    Osteoporosis    sees gyn  Right leg weakness    Varicose veins    Past Surgical History:  Procedure Laterality Date   BACK SURGERY  06/28/2021   by Dr Yetta Barre   jaw resection     RHINOPLASTY     TONSILLECTOMY     TUBAL LIGATION     bilteral   Family History  Problem Relation Age of Onset   Colon cancer Brother 65       died at age 44   Pancreatic cancer Brother    Atrial fibrillation Mother    Alzheimer's disease Father    Healthy Sister    Healthy Sister    Other Sister        immune issues?   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      Home Situation: lives with husband      Spiritual Beliefs: Christian      Lifestyle: no regular exercise; diet is good      Right handed       MB RN 11/04/2020    Social Drivers of Health   Financial Resource Strain: Low Risk  (04/26/2023)   Overall Financial Resource Strain (CARDIA)    Difficulty of Paying Living Expenses: Not hard at all  Food Insecurity: No Food Insecurity (04/26/2023)   Hunger Vital Sign    Worried About Running Out of Food in the Last Year: Never true    Ran Out of Food in the Last Year: Never true  Transportation Needs: No Transportation Needs (04/26/2023)   PRAPARE - Administrator, Civil Service (Medical): No    Lack of Transportation (Non-Medical): No  Physical Activity: Insufficiently Active (04/26/2023)   Exercise Vital Sign    Days of Exercise per Week: 5 days    Minutes of Exercise per Session: 10 min  Stress: No Stress Concern Present (04/26/2023)   [employer] of Occupational Health - Occupational Stress Questionnaire    Feeling of Stress : Not at all  Social Connections: Socially Integrated (04/26/2023)   Social Connection and Isolation Panel [NHANES]    Frequency of Communication with Friends and Family: More than three times a week    Frequency of Social Gatherings with Friends and Family: More than three times a week     Attends Religious Services: More than 4 times per year    Active Member of [employer] or Organizations: Yes    Attends Engineer, structural: More than 4 times per year    Marital Status: Married    Tobacco Counseling Counseling given: Not Answered   Clinical Intake:  Pre-visit preparation completed: Yes  Pain : No/denies pain     BMI - recorded: 30 Nutritional Status: BMI > 30  Obese Nutritional Risks: None Diabetes: No  How often do  you need to have someone help you when you read instructions, pamphlets, or other written materials from your doctor or pharmacy?: 1 - Never  Interpreter Needed?: No  Information entered by :: Theresa Mulligan LPN   Activities of Daily Living    04/26/2023   10:06 AM  In your present state of health, do you have any difficulty performing the following activities:  Hearing? 0  Vision? 0  Difficulty concentrating or making decisions? 0  Walking or climbing stairs? 0  Dressing or bathing? 1  Comment Husband assist  Doing errands, shopping? 1  Comment Husband assist  Preparing Food and eating ? N  Using the Toilet? N  In the past six months, have you accidently leaked urine? Y  Comment Wears Depends. Followed by PCP  Do you have problems with loss of bowel control? N  Managing your Medications? Y  Comment Husband assist  Managing your Finances? Y  Comment Husband assist  Housekeeping or managing your Housekeeping? Y  Comment Husband assist    Patient Care Team: Karie Georges, MD as PCP - General (Family Medicine) Aris Lot, MD as Consulting Physician (Dermatology) Meryl Dare, MD (Inactive) as Consulting Physician (Gastroenterology) Tarry Kos, MD as Attending Physician (Orthopedic Surgery) [facility], [facility], DO as Consulting Physician (Family Medicine) Terressa Koyanagi, DO as Consulting Physician (Family Medicine)  Indicate any recent Medical Services you may have received  from other than [facility] providers in the past year (date may be approximate).     Assessment:   This is a routine wellness examination for Amelina.  Hearing/Vision screen Hearing Screening - Comments:: Denies hearing difficulties   Vision Screening - Comments:: Wears rx glasses - up to date with routine eye exams with  [facility]   Goals Addressed               This Visit's Progress     Increase physical activity (pt-stated)        Stay active.       Depression Screen    04/26/2023   10:05 AM 04/21/2023   11:26 AM 04/12/2022    8:39 AM 01/11/2022   11:03 AM 05/13/2020    3:17 PM 12/26/2017    1:30 PM 07/26/2016    9:44 AM  PHQ 2/9 Scores  PHQ - 2 Score 0 0 0 2 0 0 0  PHQ- 9 Score 0 0  5       Fall Risk    04/26/2023   10:08 AM 04/21/2023   11:26 AM 04/13/2022   10:31 AM 04/12/2022    8:41 AM 01/11/2022   11:03 AM  Fall Risk   Falls in the past year? 0 0 0 0 0  Number falls in past yr: 0 0 0 0 0  Injury with Fall? 0 0 0 0 0  Risk for fall due to : No Fall Risks Impaired mobility;Impaired balance/gait No Fall Risks No Fall Risks No Fall Risks  Follow up Falls prevention discussed Falls evaluation completed Falls evaluation completed Falls prevention discussed Falls evaluation completed    MEDICARE RISK AT HOME: Medicare Risk at Home Any stairs in or around the home?: Yes If so, are there any without handrails?: No Home free of loose throw rugs in walkways, pet beds, electrical cords, etc?: Yes Adequate lighting in your home to reduce risk of falls?: Yes Life alert?: No Use of a cane, walker or w/c?: No Grab bars in the  bathroom?: Yes Shower chair or bench in shower?: Yes Elevated toilet seat or a handicapped toilet?: Yes  TIMED UP AND GO:  Was the test performed?  No    Cognitive Function:    02/28/2022    3:02 PM  MMSE - Mini Mental State Exam  Orientation to time 4  Orientation to Place 3  Registration 3  Attention/ Calculation 5  Recall 2  Language-  name 2 objects 2  Language- repeat 1  Language- follow 3 step command 3  Language- read & follow direction 1  Write a sentence 1  Copy design 1  Total score 26        04/26/2023   10:09 AM 04/12/2022    8:41 AM  6CIT Screen  What Year? 0 points 0 points  What month? 0 points 0 points  What time? 0 points 0 points  Count back from 20 0 points 0 points  Months in reverse 0 points 0 points  Repeat phrase 0 points 0 points  Total Score 0 points 0 points    Immunizations Immunization History  Administered Date(s) Administered   PFIZER(Purple Top)SARS-COV-2 Vaccination 05/26/2019, 06/25/2019, 01/13/2020   PNEUMOCOCCAL CONJUGATE-20 04/21/2023   Td 07/26/2016    TDAP status: Up to date  Flu Vaccine status: Declined, Education has been provided regarding the importance of this vaccine but patient still declined. Advised may receive this vaccine at local pharmacy or Health Dept. Aware to provide a copy of the vaccination record if obtained from local pharmacy or Health Dept. Verbalized acceptance and understanding.  Pneumococcal vaccine status: Up to date  Covid-19 vaccine status: Declined, Education has been provided regarding the importance of this vaccine but patient still declined. Advised may receive this vaccine at local pharmacy or Health Dept.or vaccine clinic. Aware to provide a copy of the vaccination record if obtained from local pharmacy or Health Dept. Verbalized acceptance and understanding.  Qualifies for Shingles Vaccine? Yes   Zostavax completed No   Shingrix Completed?: No.    Education has been provided regarding the importance of this vaccine. Patient has been advised to call insurance company to determine out of pocket expense if they have not yet received this vaccine. Advised may also receive vaccine at local pharmacy or Health Dept. Verbalized acceptance and understanding.  Screening Tests Health Maintenance  Topic Date Due   Zoster Vaccines- Shingrix (1 of 2)  Never done   COVID-19 Vaccine (4 - 2024-25 season) 12/04/2022   INFLUENZA VACCINE  07/03/2023 (Originally 11/03/2022)   Medicare Annual Wellness (AWV)  04/25/2024   DTaP/Tdap/Td (2 - Tdap) 07/27/2026   Pneumonia Vaccine 60+ Years old  Completed   DEXA SCAN  Completed   Hepatitis C Screening  Completed   HPV VACCINES  Aged Out   Colonoscopy  Discontinued    Health Maintenance  Health Maintenance Due  Topic Date Due   Zoster Vaccines- Shingrix (1 of 2) Never done   COVID-19 Vaccine (4 - 2024-25 season) 12/04/2022        Bone Density status: Completed 08/26/14. Results reflect: Bone density results: OSTEOPOROSIS. Repeat every   years.    Additional Screening:  Hepatitis C Screening: does qualify; Completed 07/26/16  Vision Screening: Recommended annual ophthalmology exams for early detection of glaucoma and other disorders of the eye. Is the patient up to date with their annual eye exam?  Yes  Who is the provider or what is the name of the office in which the patient attends annual eye exams? [facility] If pt is not established with a provider, would they like to be referred to a provider to establish care? No .   Dental Screening: Recommended annual dental exams for proper oral hygiene    Community Resource Referral / Chronic Care Management:  CRR required this visit?  No   CCM required this visit?  No     Plan:     I have personally reviewed and noted the following in the patient's chart:   Medical and social history Use of alcohol, tobacco or illicit drugs  Current medications and supplements including opioid prescriptions. Patient is not currently taking opioid prescriptions. Functional ability and status Nutritional status Physical activity Advanced directives List of other physicians Hospitalizations, surgeries, and ER visits in previous 12 months Vitals Screenings to include cognitive, depression, and falls Referrals and appointments  In addition,  I have reviewed and discussed with patient certain preventive protocols, quality metrics, and best practice recommendations. A written personalized care plan for preventive services as well as general preventive health recommendations were provided to patient.     Tillie Rung, LPN   07/11/8117   After Visit Summary: (MyChart) Due to this being a telephonic visit, the after visit summary with patients personalized plan was offered to patient via MyChart   Nurse Notes: None

## 2023-04-26 NOTE — Patient Instructions (Addendum)
Brandi Villarreal , Thank you for taking time to come for your Medicare Wellness Visit. I appreciate your ongoing commitment to your health goals. Please review the following plan we discussed and let me know if I can assist you in the future.   Referrals/Orders/Follow-Ups/Clinician Recommendations:   This is a list of the screening recommended for you and due dates:  Health Maintenance  Topic Date Due   Zoster (Shingles) Vaccine (1 of 2) Never done   COVID-19 Vaccine (4 - 2024-25 season) 12/04/2022   Flu Shot  07/03/2023*   Medicare Annual Wellness Visit  04/25/2024   DTaP/Tdap/Td vaccine (2 - Tdap) 07/27/2026   Pneumonia Vaccine  Completed   DEXA scan (bone density measurement)  Completed   Hepatitis C Screening  Completed   HPV Vaccine  Aged Out   Colon Cancer Screening  Discontinued  *Topic was postponed. The date shown is not the original due date.    Advanced directives: (In Chart) A copy of your advanced directives are scanned into your chart should your provider ever need it.  Next Medicare Annual Wellness Visit scheduled for next year: Yes

## 2023-04-26 NOTE — Addendum Note (Signed)
Addended by: Karie Georges on: 04/26/2023 03:59 PM   Modules accepted: Orders

## 2023-05-16 ENCOUNTER — Ambulatory Visit: Payer: Medicare Other | Admitting: Diagnostic Neuroimaging

## 2023-05-16 ENCOUNTER — Encounter: Payer: Self-pay | Admitting: Diagnostic Neuroimaging

## 2023-05-16 VITALS — BP 148/77 | HR 78 | Ht 61.0 in | Wt 163.0 lb

## 2023-05-16 DIAGNOSIS — G3184 Mild cognitive impairment, so stated: Secondary | ICD-10-CM

## 2023-05-16 NOTE — Progress Notes (Signed)
[facility]  PATIENT: Brandi Villarreal DOB: 08-22-1945  REFERRING CLINICIAN: Karie Georges, MD HISTORY FROM: patient and husband REASON FOR VISIT: follow up    HISTORICAL  CHIEF COMPLAINT:  Chief Complaint  Patient presents with   Follow-up    Pt in 6 with husband Pt here for memory f/u Husband states pt memory has improved     HISTORY OF PRESENT ILLNESS:   UPDATE (05/16/23, VRP): Since last visit, doing well.  Memory and cognitive symptoms are stable to minimally progressed.  Overall able to maintain most of her ADLs except those limited by her physical limitations.  Using a walker and doing fairly well.  Gait has improved since last visit.    UPDATE (02/28/22, VRP): Since last visit, had low back surgery 06/28/21. Right leg issues continue. Also having some short term memory loss issues (since ~2021). Repeating herself. Forgetting recent conversations. Got lost driving in 1610 when she was driving before.  Patient is mainly noted by patient's husband.  Patient notes some problems and does not think they are that significant.  She is able to take care of most of her ADLs at home.  She is having more difficulty with more complex tasks and operating independently outside of the home.  PRIOR HPI (11/04/20): 78 year old female here for evaluation of right leg numbness and weakness.  10/10/2020 patient was climbing into bed, on her knees when all of a sudden her left knee was injured.  The next morning her left knee was swollen and she had difficulty bearing weight on the left side.  2 days later she was back in [place] and went to [facility] orthopedic for evaluation.  She had cortisone injection of the left knee.  Left knee steadily improved over the next week.  10/24/2020 patient was out of town and woke up with sudden right toe and foot numbness and tingling.  She was having difficulty with muscle weakness on the right side and bearing weight.  Symptoms worsened over  the next day.  She also noticed some right shoulder numbness and pain.  2 days later she also noticed some tingling station of the left foot.  She had MRI of the lumbar spine which showed mild to moderate spinal stenosis at L4-5 and some other degenerative changes.  Since then patient continues to have problems with gait and balance difficulty.  She is mainly using a wheelchair.  She is not able to stand on her own.  Needs extreme help from husband to transfer and use bathroom.  1 month prior to onset of symptoms patient and husband had a flu symptoms.  They were COVID-negative at that time.    REVIEW OF SYSTEMS: Full 14 system review of systems performed and negative with exception of: as per HPI.  ALLERGIES: Allergies  Allergen Reactions   Iodine     Anaphylaxis following IVP dye   Penicillins     Has patient had a PCN reaction causing immediate rash, facial/tongue/throat swelling, SOB or lightheadedness with hypotension: Yes Has patient had a PCN reaction causing severe rash involving mucus membranes or skin necrosis: No Has patient had a PCN reaction that required hospitalization: No Has patient had a PCN reaction occurring within the last 10 years: No If all of the above answers are "NO", then may proceed with Cephalosporin use.    Morphine     Mental status changes   Sulfonamide Derivatives     itching   Cortisone Itching    Due  to eye disorder   Doxycycline Hives    Ringing in ears   Fluticasone Propionate     ? Reaction-advised not to take per eye doctor   Hydrocodone Itching   Other Nausea And Vomiting    Potato    HOME MEDICATIONS: Outpatient Medications Prior to Visit  Medication Sig Dispense Refill   acetaminophen (TYLENOL) 650 MG CR tablet Take 650 mg by mouth every 8 (eight) hours as needed for pain.     acyclovir ointment (ZOVIRAX) 5 % Apply 1 application. topically every 3 (three) hours as needed (rash).     alendronate (FOSAMAX) 70 MG tablet Take 70 mg by  mouth every Tuesday.     Budeson-Glycopyrrol-Formoterol (BREZTRI AEROSPHERE) 160-9-4.8 MCG/ACT AERO Inhale 2 puffs into the lungs 2 (two) times daily. 10.7 g 11   Iron, Ferrous Sulfate, 325 (65 Fe) MG TABS Take 325 mg by mouth daily. 30 tablet 3   LUTEIN PO Take 1 tablet by mouth once a week.     Multiple Vitamin (MULTIVITAMIN WITH MINERALS) TABS tablet Take 1 tablet by mouth 3 (three) times a week.     Omega-3 Fatty Acids (OMEGA 3 PO) Take 15 mLs by mouth in the morning.     Sodium Chloride-Xylitol (XLEAR SINUS CARE SPRAY NA) Place 1-2 sprays into the nose 3 (three) times daily as needed (congestion.).     Vitamin D-Vitamin K (K2 PLUS D3 PO) Take 10 drops by mouth in the morning. (Liquid)     No facility-administered medications prior to visit.    PAST MEDICAL HISTORY: Past Medical History:  Diagnosis Date   Cholelithiasis    rare symptoms, declined surgery   Colitis ?1975   Hemorrhoids    Lichen sclerosus    sees gyn for management   Mitral valve prolapse    Osteoporosis    sees gyn   Right leg weakness    Varicose veins     PAST SURGICAL HISTORY: Past Surgical History:  Procedure Laterality Date   BACK SURGERY  06/28/2021   by Dr Yetta Barre   jaw resection     RHINOPLASTY     TONSILLECTOMY     TUBAL LIGATION     bilteral    FAMILY HISTORY: Family History  Problem Relation Age of Onset   Colon cancer Brother 63       died at age 46   Pancreatic cancer Brother    Atrial fibrillation Mother    Alzheimer's disease Father    Healthy Sister    Healthy Sister    Other Sister        immune issues?   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      Home Situation: lives with husband  Spiritual Beliefs: Christian      Lifestyle: no regular exercise; diet is good      Right handed       MB RN 11/04/2020    Pt lives with husband    Social Drivers of Health   Financial Resource Strain: Low Risk  (04/26/2023)   Overall Financial Resource Strain (CARDIA)    Difficulty of Paying Living Expenses: Not hard at all  Food Insecurity: No Food Insecurity (04/26/2023)   Hunger Vital Sign    Worried About Running Out of Food in the Last Year: Never true    Ran Out of Food in the Last Year: Never true  Transportation Needs: No Transportation Needs (04/26/2023)   PRAPARE - Administrator, Civil Service (Medical): No    Lack of Transportation (Non-Medical): No  Physical Activity: Insufficiently Active (04/26/2023)   Exercise Vital Sign    Days of Exercise per Week: 5 days    Minutes of Exercise per Session: 10 min  Stress: No Stress Concern Present (04/26/2023)   [employer] of Occupational Health - Occupational Stress Questionnaire    Feeling of Stress : Not at all  Social Connections: Socially Integrated (04/26/2023)   Social Connection and Isolation Panel [NHANES]    Frequency of Communication with Friends and Family: More than three times a week    Frequency of Social Gatherings with Friends and Family: More than three times a week    Attends Religious Services: More than 4 times per year    Active Member of [employer] or Organizations: Yes    Attends Banker Meetings: More than 4 times per year    Marital Status: Married  Catering manager Violence: Not At Risk (04/26/2023)   Humiliation, Afraid, Rape, and Kick questionnaire    Fear  of Current or Ex-Partner: No    Emotionally Abused: No    Physically Abused: No    Sexually Abused: No     PHYSICAL EXAM  GENERAL EXAM/CONSTITUTIONAL: Vitals:  Vitals:   05/16/23 1553  BP: (!) 148/77  Pulse: 78  Weight: 163 lb (73.9 kg)  Height: 5\' 1"  (1.549 m)   Body mass index is 30.8 kg/m. Wt Readings from Last 3 Encounters:  05/16/23 163 lb (73.9 kg)  04/26/23 164 lb (74.4 kg)  04/21/23 164 lb 1.6 oz (74.4 kg)   Patient is in no distress; well developed, nourished and groomed; neck is supple  CARDIOVASCULAR: Examination of carotid arteries is normal; no carotid bruits Regular rate and rhythm, no murmurs Examination of peripheral vascular system by observation and palpation is normal  EYES: Ophthalmoscopic exam of optic discs and posterior segments is normal; no papilledema or hemorrhages No results found.  MUSCULOSKELETAL: Gait, strength, tone, movements noted in Neurologic exam below  NEUROLOGIC: MENTAL STATUS:     05/16/2023    3:55 PM 02/28/2022    3:02 PM  MMSE - Mini Mental State Exam  Orientation to time 5 4  Orientation to Place 4 3  Registration 3 3  Attention/ Calculation 2 5  Recall 3 2  Language- name 2 objects 2 2  Language- repeat 1 1  Language- follow 3 step command 3 3  Language- read & follow direction 1 1  Write a sentence 1 1  Copy design 0 1  Total score 25 26   awake, alert, oriented to person, place and time recent and remote memory intact normal attention and concentration language fluent, comprehension intact, naming intact fund of knowledge appropriate  CRANIAL NERVE:  2nd - no papilledema on fundoscopic exam 2nd, 3rd, 4th, 6th - pupils equal and reactive to light, visual fields full to confrontation, extraocular muscles intact, no nystagmus 5th - facial sensation symmetric 7th - facial strength symmetric 8th - hearing intact 9th - palate elevates symmetrically, uvula midline 11th - shoulder shrug symmetric 12th -  tongue protrusion midline  MOTOR:  BUE 4+, BLE 4+  SENSORY:  normal and symmetric to light touch, temperature, vibration; EXCEPT DECR IN RIGHT KNEE  COORDINATION:  finger-nose-finger, fine finger movements normal  REFLEXES:  deep tendon reflexes 1+ and symmetric  GAIT/STATION:  STABLE; USING WALKER     DIAGNOSTIC DATA (LABS, IMAGING, TESTING) - I reviewed patient records, labs, notes, testing and imaging myself where available.  Lab Results  Component Value Date   WBC 6.8 04/21/2023   HGB 9.0 (L) 04/21/2023   HCT 30.3 (L) 04/21/2023   MCV 68.8 Repeated and verified X2. (L) 04/21/2023   PLT 381.0 04/21/2023      Component Value Date/Time   NA 135 04/21/2023 1327   NA 142 11/04/2020 1113   K 4.0 04/21/2023 1327   CL 101 04/21/2023 1327   CO2 25 04/21/2023 1327   GLUCOSE 221 (H) 04/21/2023 1327   BUN 19 04/21/2023 1327   BUN 14 11/04/2020 1113   CREATININE 0.83 04/21/2023 1327   CALCIUM 8.9 04/21/2023 1327   PROT 7.6 04/21/2023 1327   PROT 6.6 11/04/2020 1113   ALBUMIN 4.4 04/21/2023 1327   ALBUMIN 4.0 11/04/2020 1113   AST 18 04/21/2023 1327   ALT 12 04/21/2023 1327   ALKPHOS 107 04/21/2023 1327   BILITOT 0.4 04/21/2023 1327   BILITOT 0.4 11/04/2020 1113   GFRNONAA >60 06/22/2021 1022   GFRAA >60 06/28/2017 0940   Lab Results  Component Value Date   CHOL 167 04/21/2023   HDL 63.60 04/21/2023   LDLCALC 59 04/21/2023   TRIG 222.0 (H) 04/21/2023   CHOLHDL 3 04/21/2023   Lab Results  Component Value Date   HGBA1C 6.1 04/21/2023   Lab Results  Component Value Date   VITAMINB12 1,184 11/04/2020   Lab Results  Component Value Date   TSH 1.300 11/04/2020   02/28/22 Component Ref Range & Units (hover) 1 yr ago  A -- Beta-amyloid 42/40 Ratio 0.090 Low   Beta-amyloid 42 20.66  Beta-amyloid 40 229.80  T -- p-tau181 1.78 High   N -- NfL, Plasma 3.33  ATN SUMMARY Comment  Comment:                        A+ T+ N-    08/03/17 MRI lumbar spine [I  reviewed images myself and agree with interpretation. -VRP]  1. At L4-L5, grade 1 anterolisthesis with mild to moderate canal and bilateral subarticular recess stenosis and mild right greater than left foraminal stenosis. 2. At L3-L4, right eccentric facet hypertrophy closely approximates the exiting right L3 nerve with mild-to-moderate right foraminal stenosis.  11/17/20 MRI brain -Intermittently motion degraded examination, as described. -No evidence of acute intracranial abnormality. -Minimal chronic small-vessel ischemic changes within the cerebral white matter. -Otherwise unremarkable MRI appearance of the brain for age. -Trace fluid within the bilateral mastoid air cells.  04/14/22 MRI scan of the brain with and without contrast showing mild age-related changes of generalized cerebral atrophy and small vessel disease. No significant change compared with previous MRI from 11/17/2020.   ASSESSMENT AND PLAN  78 y.o. year old female here  with sudden onset right side leg numbness, weakness and pain starting on 10/24/2020. Now with progressive memory loss.  Dx:  1. MCI (mild cognitive impairment)      PLAN:  MEMORY LOSS (concern for mild cognitive impairment or mild dementia, confounded by mobility limitations, and MMSE of 25/30) - safety / supervision issues reviewed - daily physical activity / exercise (at least 15-30 minutes) - eat more plants / vegetables - increase social activities, brain stimulation, games, puzzles, hobbies, crafts, arts, music - aim for at least 7-8 hours sleep per night (or more) - avoid smoking and alcohol - caregiver resources provided - caution with medications, finances; no driving  GAIT DIFFICULTY - use walker; fall precautions  Return for return to PCP.    Suanne Marker, MD 05/16/2023, 4:37 PM Certified in Neurology, Neurophysiology and Neuroimaging  [facility] [address] [place], [place]  [address] [phone]

## 2023-05-16 NOTE — Patient Instructions (Signed)
MEMORY LOSS - safety / supervision issues reviewed - daily physical activity / exercise (at least 15-30 minutes) - eat more plants / vegetables - increase social activities, brain stimulation, games, puzzles, hobbies, crafts, arts, music - aim for at least 7-8 hours sleep per night (or more) - avoid smoking and alcohol - caregiver resources provided - caution with medications, finances; no driving  - consider memantine or donepezil tablets - consider lequembi or kisunla infusion

## 2023-07-20 LAB — HM DEXA SCAN

## 2023-08-11 LAB — HM MAMMOGRAPHY

## 2023-08-15 ENCOUNTER — Other Ambulatory Visit (INDEPENDENT_AMBULATORY_CARE_PROVIDER_SITE_OTHER)

## 2023-08-15 DIAGNOSIS — D509 Iron deficiency anemia, unspecified: Secondary | ICD-10-CM

## 2023-08-15 LAB — CBC
HCT: 44.6 % (ref 36.0–46.0)
Hemoglobin: 14.2 g/dL (ref 12.0–15.0)
MCHC: 31.8 g/dL (ref 30.0–36.0)
MCV: 92.2 fl (ref 78.0–100.0)
Platelets: 271 10*3/uL (ref 150.0–400.0)
RBC: 4.84 Mil/uL (ref 3.87–5.11)
RDW: 14.9 % (ref 11.5–15.5)
WBC: 5.2 10*3/uL (ref 4.0–10.5)

## 2023-08-16 ENCOUNTER — Other Ambulatory Visit: Payer: Self-pay | Admitting: Family Medicine

## 2023-08-16 ENCOUNTER — Ambulatory Visit: Payer: Self-pay | Admitting: Family Medicine

## 2023-08-16 DIAGNOSIS — D509 Iron deficiency anemia, unspecified: Secondary | ICD-10-CM

## 2023-08-16 LAB — IRON,TIBC AND FERRITIN PANEL
%SAT: 13 % — ABNORMAL LOW (ref 16–45)
Ferritin: 14 ng/mL — ABNORMAL LOW (ref 16–288)
Iron: 48 ug/dL (ref 45–160)
TIBC: 380 ug/dL (ref 250–450)

## 2023-10-24 ENCOUNTER — Ambulatory Visit: Payer: Medicare Other | Admitting: Family Medicine

## 2023-10-24 ENCOUNTER — Encounter: Payer: Self-pay | Admitting: Family Medicine

## 2023-10-24 VITALS — BP 110/60 | HR 75 | Temp 97.4°F | Ht 61.0 in | Wt 165.9 lb

## 2023-10-24 DIAGNOSIS — E559 Vitamin D deficiency, unspecified: Secondary | ICD-10-CM | POA: Diagnosis not present

## 2023-10-24 DIAGNOSIS — R7303 Prediabetes: Secondary | ICD-10-CM | POA: Insufficient documentation

## 2023-10-24 DIAGNOSIS — D509 Iron deficiency anemia, unspecified: Secondary | ICD-10-CM | POA: Insufficient documentation

## 2023-10-24 LAB — POCT GLYCOSYLATED HEMOGLOBIN (HGB A1C): Hemoglobin A1C: 5.2 % (ref 4.0–5.6)

## 2023-10-24 LAB — VITAMIN D 25 HYDROXY (VIT D DEFICIENCY, FRACTURES): VITD: 50.51 ng/mL (ref 30.00–100.00)

## 2023-10-24 NOTE — Progress Notes (Signed)
 Established Patient Office Visit  Subjective   Patient ID: Brandi Villarreal, female    DOB: 10-26-1945  Age: 78 y.o. MRN: [id number]  Chief Complaint  Patient presents with   Medical Management of Chronic Issues    Pt is here for follow up on iron  deficiency anemia and prediabetes.  Prediabetes-- A1C in office today is 5.2 today, back to normal. Husband reports that they have been cutting down on the sugar consumption and it has been working for them.   Iron  deficiency anemia- pt reports she has been taking her iron  supplements regularly. Husband reports it is making her constipated so they have to give her a few days off at a time.       Current Outpatient Medications  Medication Instructions   acetaminophen  (TYLENOL ) 650 mg, Every 8 hours PRN   acyclovir ointment (ZOVIRAX) 5 % 1 application , Every  3 hours PRN   alendronate  (FOSAMAX ) 70 mg, Every Tue   Budeson-Glycopyrrol-Formoterol (BREZTRI  AEROSPHERE) 160-9-4.8 MCG/ACT AERO 2 puffs, Inhalation, 2 times daily   FeroSul 325 mg, Oral, Daily   LUTEIN PO 1 tablet, Weekly   Multiple Vitamin (MULTIVITAMIN WITH MINERALS) TABS tablet 1 tablet, 3 times weekly   Omega-3 Fatty Acids (OMEGA 3 PO) 15 mLs, Every morning   Sodium Chloride -Xylitol (XLEAR SINUS CARE SPRAY NA) 1-2 sprays, 3 times daily PRN   Vitamin D -Vitamin K (K2 PLUS D3 PO) 10 drops, Every morning    Patient Active Problem List   Diagnosis Date Noted   Prediabetes 10/24/2023   Iron  deficiency anemia 10/24/2023   S/P lumbar fusion 06/28/2021   Histoplasmosis 08/21/2019   Chronic left shoulder pain 08/03/2017   Aortic atherosclerosis (HCC) 07/18/2017   Cholelithiasis 03/20/2012   Osteoporosis 08/09/2006      Review of Systems  All other systems reviewed and are negative.     Objective:     BP 110/60   Pulse 75   Temp (!) 97.4 F (36.3 C) (Oral)   Ht 5' 1 (1.549 m)   Wt 165 lb 14.4 oz (75.3 kg)   SpO2 99%   BMI 31.35 kg/m    Physical  Exam Vitals reviewed.  Constitutional:      Appearance: Normal appearance. She is well-groomed and normal weight.  Eyes:     Conjunctiva/sclera: Conjunctivae normal.  Neck:     Thyroid : No thyromegaly.  Cardiovascular:     Rate and Rhythm: Normal rate and regular rhythm.     Pulses: Normal pulses.     Heart sounds: S1 normal and S2 normal.  Pulmonary:     Effort: Pulmonary effort is normal.     Breath sounds: Normal breath sounds and air entry.  Abdominal:     General: Bowel sounds are normal.  Musculoskeletal:     Right lower leg: No edema.     Left lower leg: No edema.  Neurological:     Mental Status: She is alert and oriented to person, place, and time. Mental status is at baseline.     Gait: Gait is intact.  Psychiatric:        Mood and Affect: Mood and affect normal.        Speech: Speech normal.        Behavior: Behavior normal.        Judgment: Judgment normal.      Results for orders placed or performed in visit on 10/24/23  POC HgB A1c  Result Value Ref Range   Hemoglobin A1C 5.2  4.0 - 5.6 %   HbA1c POC (<> result, manual entry)     HbA1c, POC (prediabetic range)     HbA1c, POC (controlled diabetic range)        The 10-year ASCVD risk score (Arnett DK, et al., 2019) is: 15.2%    Assessment & Plan:  Prediabetes Assessment & Plan: A1C today is back to normal with the dietary changes they have made at home. I congratulated the patient and encouraged her to continue her dietary changes  Orders: -     POCT glycosylated hemoglobin (Hb A1C)  Iron  deficiency anemia, unspecified iron  deficiency anemia type Assessment & Plan: Needs new iron  panel in follow up today, husband reports constipation side effects from the iron  supplements.  Will determine from the labs if she can reduce her usage of the iron  supplements.   Orders: -     Iron , TIBC and Ferritin Panel; Future  Vitamin D  deficiency -     VITAMIN D  25 Hydroxy (Vit-D Deficiency, Fractures);  Future     Return in about 6 months (around 04/25/2024) for annual physical exam.    Heron CHRISTELLA Sharper, MD

## 2023-10-24 NOTE — Assessment & Plan Note (Signed)
 Needs new iron  panel in follow up today, husband reports constipation side effects from the iron  supplements.  Will determine from the labs if she can reduce her usage of the iron  supplements.

## 2023-10-24 NOTE — Assessment & Plan Note (Signed)
 A1C today is back to normal with the dietary changes they have made at home. I congratulated the patient and encouraged her to continue her dietary changes

## 2023-10-25 ENCOUNTER — Ambulatory Visit: Payer: Self-pay | Admitting: Family Medicine

## 2023-10-25 LAB — IRON,TIBC AND FERRITIN PANEL
%SAT: 13 % — ABNORMAL LOW (ref 16–45)
Ferritin: 13 ng/mL — ABNORMAL LOW (ref 16–288)
Iron: 52 ug/dL (ref 45–160)
TIBC: 395 ug/dL (ref 250–450)

## 2024-01-23 ENCOUNTER — Other Ambulatory Visit: Payer: Self-pay | Admitting: Family Medicine

## 2024-01-23 DIAGNOSIS — D509 Iron deficiency anemia, unspecified: Secondary | ICD-10-CM

## 2024-04-10 ENCOUNTER — Telehealth: Payer: Self-pay | Admitting: *Deleted

## 2024-04-10 NOTE — Telephone Encounter (Signed)
 Spoke with Brandi Villarreal and informed him per last office notes, patient is due for a physical/follow up and labs can be ordered by PCP during the visit.  Appt was scheduled on 1/20.

## 2024-04-10 NOTE — Telephone Encounter (Signed)
 Copied from CRM [id number]. Topic: Clinical - Medical Advice >> Apr 10, 2024  1:50 PM Victoria A wrote: Reason for CRM: Patients spouse called and said that the doctor wanted to check patients iron  but did not say when-he would like a call back at [phone] listed on [facility]

## 2024-04-23 ENCOUNTER — Encounter: Payer: Self-pay | Admitting: Family Medicine

## 2024-04-23 ENCOUNTER — Ambulatory Visit: Admitting: Family Medicine

## 2024-04-23 ENCOUNTER — Ambulatory Visit: Payer: Self-pay | Admitting: Family Medicine

## 2024-04-23 VITALS — BP 122/70 | HR 80 | Temp 97.6°F | Ht 61.0 in | Wt 165.6 lb

## 2024-04-23 DIAGNOSIS — D509 Iron deficiency anemia, unspecified: Secondary | ICD-10-CM

## 2024-04-23 DIAGNOSIS — R7303 Prediabetes: Secondary | ICD-10-CM | POA: Diagnosis not present

## 2024-04-23 DIAGNOSIS — Z0000 Encounter for general adult medical examination without abnormal findings: Secondary | ICD-10-CM | POA: Diagnosis not present

## 2024-04-23 LAB — CBC WITH DIFFERENTIAL/PLATELET
Basophils Absolute: 0 K/uL (ref 0.0–0.1)
Basophils Relative: 0.7 % (ref 0.0–3.0)
Eosinophils Absolute: 0.2 K/uL (ref 0.0–0.7)
Eosinophils Relative: 3.5 % (ref 0.0–5.0)
HCT: 43.3 % (ref 36.0–46.0)
Hemoglobin: 14.3 g/dL (ref 12.0–15.0)
Lymphocytes Relative: 23.9 % (ref 12.0–46.0)
Lymphs Abs: 1.5 K/uL (ref 0.7–4.0)
MCHC: 33 g/dL (ref 30.0–36.0)
MCV: 92.8 fl (ref 78.0–100.0)
Monocytes Absolute: 0.7 K/uL (ref 0.1–1.0)
Monocytes Relative: 10.9 % (ref 3.0–12.0)
Neutro Abs: 3.9 K/uL (ref 1.4–7.7)
Neutrophils Relative %: 61 % (ref 43.0–77.0)
Platelets: 262 K/uL (ref 150.0–400.0)
RBC: 4.66 Mil/uL (ref 3.87–5.11)
RDW: 14.5 % (ref 11.5–15.5)
WBC: 6.5 K/uL (ref 4.0–10.5)

## 2024-04-23 LAB — POCT GLYCOSYLATED HEMOGLOBIN (HGB A1C): Hemoglobin A1C: 5.4 % (ref 4.0–5.6)

## 2024-04-23 LAB — LIPID PANEL
Cholesterol: 158 mg/dL (ref 28–200)
HDL: 54.4 mg/dL
LDL Cholesterol: 80 mg/dL (ref 10–99)
NonHDL: 103.3
Total CHOL/HDL Ratio: 3
Triglycerides: 117 mg/dL (ref 10.0–149.0)
VLDL: 23.4 mg/dL (ref 0.0–40.0)

## 2024-04-23 LAB — COMPREHENSIVE METABOLIC PANEL WITH GFR
ALT: 10 U/L (ref 3–35)
AST: 17 U/L (ref 5–37)
Albumin: 4.1 g/dL (ref 3.5–5.2)
Alkaline Phosphatase: 95 U/L (ref 39–117)
BUN: 18 mg/dL (ref 6–23)
CO2: 30 meq/L (ref 19–32)
Calcium: 9.4 mg/dL (ref 8.4–10.5)
Chloride: 104 meq/L (ref 96–112)
Creatinine, Ser: 0.79 mg/dL (ref 0.40–1.20)
GFR: 71.73 mL/min
Glucose, Bld: 89 mg/dL (ref 70–99)
Potassium: 4.5 meq/L (ref 3.5–5.1)
Sodium: 138 meq/L (ref 135–145)
Total Bilirubin: 0.4 mg/dL (ref 0.2–1.2)
Total Protein: 7.4 g/dL (ref 6.0–8.3)

## 2024-04-23 NOTE — Progress Notes (Signed)
 "  Complete physical exam  Patient: Brandi Villarreal   DOB: [birth year]   79 y.o. Female  MRN: [id number]  Subjective:    Chief Complaint  Patient presents with   Medical Management of Chronic Issues    ROSALYN Villarreal is a 79 y.o. female who presents today for a complete physical exam. She reports consuming a general diet. Home exercise routine includes walking 1-2 hrs per week. She generally feels well. She reports sleeping well. She does have additional problems to discuss today.  Discussed the use of AI scribe software for clinical note transcription with the patient, who gave verbal consent to proceed.  History of Present Illness   Brandi Villarreal is a 79 year old female with iron  deficiency anemia who presents for a follow-up visit.  She has taken iron  supplements for a year. Her hemoglobin improved from 9 to 14, but ferritin and iron  levels were still low on her last check in the summer. She continues oral iron .  She has constipation that she links to the iron . She uses Dulcolax a couple of times per week and occasionally Miralax and fiber gummies with relief.  She recently stopped blood pressure medication after losing weight.  She eats a varied diet with fruits, vegetables, and animal protein, and she walks and uses a Cellur[employer]iser for exercise.  She has osteoporosis and took Fosamax  for about ten years, which was stopped after bone density improved. She continues to get bone density testing. Her A1c is 5.4. She is not on prescription medications other than vitamins and supplements.       Most recent fall risk assessment:    04/26/2023   10:08 AM  Fall Risk   Falls in the past year? 0  Number falls in past yr: 0  Injury with Fall? 0   Risk for fall due to : No Fall Risks  Follow up Falls prevention discussed     Data saved with a previous flowsheet row definition     Most recent depression screenings:    04/26/2023   10:05 AM 04/21/2023   11:26 AM  PHQ 2/9 Scores  PHQ  - 2 Score 0 0  PHQ- 9 Score 0  0      Data saved with a previous flowsheet row definition    Vision:Within last year and Dental: No current dental problems and Receives regular dental care     Patient Care Team: Ozell Heron HERO, MD as PCP - General (Family Medicine) Lynnell Nottingham, MD as Consulting Physician (Dermatology) Aneita Gwendlyn DASEN, MD (Inactive) as Consulting Physician (Gastroenterology) Jerri Kay HERO, MD as Attending Physician (Orthopedic Surgery) [facility], [facility] Chiquita SAUNDERS, DO as Consulting Physician (Family Medicine) Luke Chiquita SAUNDERS, DO as Consulting Physician (Family Medicine)   Show/hide medication list[1]  Review of Systems  HENT:  Negative for hearing loss.   Eyes:  Negative for blurred vision.  Respiratory:  Negative for shortness of breath.   Cardiovascular:  Negative for chest pain.  Gastrointestinal: Negative.   Genitourinary: Negative.   Musculoskeletal:  Negative for back pain.  Neurological:  Negative for headaches.  Psychiatric/Behavioral:  Negative for depression.        Objective:     BP 122/70 (BP Location: Left Arm, Patient Position: Sitting, Cuff Size: Normal)   Pulse 80   Temp 97.6 F (36.4 C) (Oral)   Ht 5' 1 (1.549 m)   Wt 165 lb 9.6 oz (75.1 kg)   SpO2 98%  BMI 31.29 kg/m    Physical Exam Vitals reviewed.  Constitutional:      Appearance: Normal appearance. She is well-groomed. She is obese.  HENT:     Right Ear: Tympanic membrane and ear canal normal.     Left Ear: Tympanic membrane and ear canal normal.     Mouth/Throat:     Mouth: Mucous membranes are moist.     Pharynx: No posterior oropharyngeal erythema.  Eyes:     Conjunctiva/sclera: Conjunctivae normal.  Neck:     Thyroid : No thyromegaly.  Cardiovascular:     Rate and Rhythm: Normal rate and regular rhythm.     Pulses: Normal pulses.     Heart sounds: S1 normal and S2 normal.  Pulmonary:     Effort: Pulmonary effort is normal.      Breath sounds: Normal breath sounds and air entry.  Abdominal:     General: Abdomen is flat. Bowel sounds are normal.     Palpations: Abdomen is soft.  Musculoskeletal:     Right lower leg: No edema.     Left lower leg: No edema.  Lymphadenopathy:     Cervical: No cervical adenopathy.  Neurological:     Mental Status: She is alert and oriented to person, place, and time. Mental status is at baseline.     Gait: Gait is intact.  Psychiatric:        Mood and Affect: Mood and affect normal.        Speech: Speech normal.        Behavior: Behavior normal.        Judgment: Judgment normal.      Results for orders placed or performed in visit on 04/23/24  POC HgB A1c  Result Value Ref Range   Hemoglobin A1C 5.4 4.0 - 5.6 %   HbA1c POC (<> result, manual entry)     HbA1c, POC (prediabetic range)     HbA1c, POC (controlled diabetic range)         Assessment & Plan:    Routine Health Maintenance and Physical Exam  Immunization History  Administered Date(s) Administered   PFIZER(Purple Top)SARS-COV-2 Vaccination 05/26/2019, 06/25/2019, 01/13/2020   PNEUMOCOCCAL CONJUGATE-20 04/21/2023   Td 07/26/2016    Health Maintenance  Topic Date Due   Zoster Vaccines- Shingrix (1 of 2) Never done   COVID-19 Vaccine (4 - 2025-26 season) 12/04/2023   Medicare Annual Wellness (AWV)  04/25/2024   Influenza Vaccine  07/02/2024 (Originally 11/03/2023)   DTaP/Tdap/Td (2 - Tdap) 07/27/2026   Pneumococcal Vaccine: 50+ Years  Completed   Bone Density Scan  Completed   Hepatitis C Screening  Completed   Meningococcal B Vaccine  Aged Out   Mammogram  Discontinued   Colonoscopy  Discontinued    Discussed health benefits of physical activity, and encouraged her to engage in regular exercise appropriate for her age and condition.  Prediabetes -     POCT glycosylated hemoglobin (Hb A1C) -     Collection capillary blood specimen -     Lipid panel; Future -     Comprehensive metabolic panel with  GFR; Future  Iron  deficiency anemia, unspecified iron  deficiency anemia type -     Iron , TIBC and Ferritin Panel; Future -     CBC with Differential/Platelet; Future Assessment and Plan    Iron  deficiency anemia Chronic iron  deficiency anemia with previously low ferritin levels. Hemoglobin improved from 9 to 14, but iron  levels remain low. Possible malabsorption considered but not confirmed. -  Ordered blood work to re-evaluate iron  levels - If iron  levels remain low, will consider referral to hematologist for potential iron  infusions - If iron  levels are adequate, will consider reducing iron  supplementation to every other day or three times a week  Constipation secondary to iron  supplementation Constipation likely secondary to iron  supplementation. Managed with Dulcolax and MiraLax. - Continue current management with Dulcolax and MiraLax as needed - Consider fiber supplementation with fiber gummies       Routine adult health maintenance  General physical exam findings are normal today. I reviewed the patient's preventative testing, immunizations, and lifestyle habits. I made appropriate recommendations and placed orders for the appropriate tests and/or vaccinations. I counseled the patient on the CDC's recommendations for healthy exercise and diet. I counseled the patient on healthy sleep habits and stress management. Handouts to reinforce the counseling were given at the conclusion of the visit.    Return in about 1 year (around 04/23/2025) for annual physical exam.     Heron CHRISTELLA Sharper, MD       [1]  Outpatient Medications Prior to Visit  Medication Sig   acetaminophen  (TYLENOL ) 650 MG CR tablet Take 650 mg by mouth every 8 (eight) hours as needed for pain.   acyclovir ointment (ZOVIRAX) 5 % Apply 1 application. topically every 3 (three) hours as needed (rash).   FEROSUL 325 (65 Fe) MG tablet TAKE 1 TABLET BY MOUTH DAILY   Multiple Vitamin (MULTIVITAMIN WITH MINERALS) TABS  tablet Take 1 tablet by mouth 3 (three) times a week.   Omega-3 Fatty Acids (OMEGA 3 PO) Take 15 mLs by mouth in the morning.   Sodium Chloride -Xylitol (XLEAR SINUS CARE SPRAY NA) Place 1-2 sprays into the nose 3 (three) times daily as needed (congestion.).   Vitamin D -Vitamin K (K2 PLUS D3 PO) Take 10 drops by mouth in the morning. (Liquid)   [DISCONTINUED] alendronate  (FOSAMAX ) 70 MG tablet Take 70 mg by mouth every Tuesday.   [DISCONTINUED] Budeson-Glycopyrrol-Formoterol (BREZTRI  AEROSPHERE) 160-9-4.8 MCG/ACT AERO Inhale 2 puffs into the lungs 2 (two) times daily.   [DISCONTINUED] LUTEIN PO Take 1 tablet by mouth once a week.   No facility-administered medications prior to visit.   "

## 2024-04-24 NOTE — Telephone Encounter (Signed)
 Do we have any breztri  in the sample closet?

## 2024-04-25 LAB — IRON,TIBC AND FERRITIN PANEL
%SAT: 13 % — ABNORMAL LOW (ref 16–45)
Ferritin: 14 ng/mL — ABNORMAL LOW (ref 16–288)
Iron: 47 ug/dL (ref 45–160)
TIBC: 375 ug/dL (ref 250–450)

## 2024-05-02 ENCOUNTER — Encounter: Payer: Self-pay | Admitting: Family Medicine

## 2024-05-02 DIAGNOSIS — D509 Iron deficiency anemia, unspecified: Secondary | ICD-10-CM

## 2024-05-02 MED ORDER — FERROUS SULFATE 325 (65 FE) MG PO TABS: 325.0000 mg | ORAL_TABLET | Freq: Every day | ORAL | 1 refills | Status: AC
